# Patient Record
Sex: Female | Born: 1990 | Race: Asian | Hispanic: No | Marital: Married | Smoking: Never smoker
Health system: Southern US, Community
[De-identification: ages and names within clinical notes are randomized; demographics above are authoritative.]

## PROBLEM LIST (undated history)

## (undated) DIAGNOSIS — Z789 Other specified health status: Secondary | ICD-10-CM

## (undated) HISTORY — PX: CHOLECYSTECTOMY: SHX55

## (undated) HISTORY — PX: NO PAST SURGERIES: SHX2092

---

## 2012-07-04 ENCOUNTER — Emergency Department (INDEPENDENT_AMBULATORY_CARE_PROVIDER_SITE_OTHER)
Admission: EM | Admit: 2012-07-04 | Discharge: 2012-07-04 | Disposition: A | Discharge status: Home / Self Care | Payer: Medicaid Other | Source: Home / Self Care | Attending: Emergency Medicine | Admitting: Emergency Medicine

## 2012-07-04 ENCOUNTER — Encounter (HOSPITAL_COMMUNITY): Payer: Self-pay | Admitting: Emergency Medicine

## 2012-07-04 DIAGNOSIS — M5430 Sciatica, unspecified side: Secondary | ICD-10-CM

## 2012-07-04 DIAGNOSIS — M5431 Sciatica, right side: Secondary | ICD-10-CM

## 2012-07-04 LAB — POCT URINALYSIS DIP (DEVICE)
Glucose, UA: NEGATIVE mg/dL
Hgb urine dipstick: NEGATIVE
Nitrite: NEGATIVE
Urobilinogen, UA: 0.2 mg/dL (ref 0.0–1.0)

## 2012-07-04 LAB — POCT PREGNANCY, URINE: Preg Test, Ur: NEGATIVE

## 2012-07-04 MED ORDER — HYDROCODONE-ACETAMINOPHEN 5-325 MG PO TABS
ORAL_TABLET | ORAL | Status: AC
  Filled 2012-07-04: qty 1

## 2012-07-04 MED ORDER — PREDNISONE 20 MG PO TABS
ORAL_TABLET | ORAL | Status: AC
  Filled 2012-07-04: qty 3

## 2012-07-04 MED ORDER — CYCLOBENZAPRINE HCL 10 MG PO TABS: 10.0000 mg | ORAL_TABLET | Freq: Three times a day (TID) | ORAL | Status: DC | PRN

## 2012-07-04 MED ORDER — PREDNISONE 20 MG PO TABS: 40.0000 mg | ORAL_TABLET | Freq: Every day | ORAL | Status: DC

## 2012-07-04 MED ORDER — PREDNISONE 20 MG PO TABS
60.0000 mg | ORAL_TABLET | Freq: Once | ORAL | Status: AC
  Administered 2012-07-04: 60 mg via ORAL

## 2012-07-04 MED ORDER — HYDROCODONE-ACETAMINOPHEN 5-325 MG PO TABS
1.0000 | ORAL_TABLET | Freq: Once | ORAL | Status: AC
  Administered 2012-07-04: 1 via ORAL

## 2012-07-04 MED ORDER — HYDROCODONE-ACETAMINOPHEN 5-325 MG PO TABS: 1.0000 | ORAL_TABLET | ORAL | Status: DC | PRN

## 2012-07-04 NOTE — ED Provider Notes (Signed)
Medical screening examination/treatment/procedure(s) were performed by non-physician practitioner and as supervising physician I was immediately available for consultation/collaboration.  Ruthie Berch, M.D.  Jahna Liebert C Vista Sawatzky, MD 07/04/12 2035 

## 2012-07-04 NOTE — ED Notes (Signed)
Pt c/o flank pain and bilateral leg pain x 1 wk.  Pt has not tried any otc meds for relief.  Language barrier.

## 2012-07-04 NOTE — ED Provider Notes (Signed)
History     CSN: [id number]  Arrival date & time 07/04/12  1535   First MD Initiated Contact with Patient 07/04/12 1555      Chief Complaint  Patient presents with  . Flank Pain    x 1 wk  . Leg Pain    HPI: Patient is a 22 y.o. female presenting with flank pain and leg pain. The history is provided by the patient.  Flank Pain This is a new problem. The current episode started more than 2 days ago. The problem occurs constantly. The problem has been gradually worsening. The symptoms are aggravated by walking, standing and bending. She has tried nothing for the symptoms.  Leg Pain Pt's spouse is interpreting for pt. Spouse reports pt has been c/o (R) flank and upper (R) leg pain x 1 week. Denies injury. States the pain starts in her lower (R) back and radiates into her (R) gluteal muscle and into the anterior and posterior aspects of (R) upper leg.  Denies heavy lifting or strenuous activity. Has never had this pain before. Has tried nothing for it. Worse w/ certain movements. Denies urinary sx's or fever.  History reviewed. No pertinent past medical history.  History reviewed. No pertinent past surgical history.  History reviewed. No pertinent family history.  History  Substance Use Topics  . Smoking status: Never Smoker   . Smokeless tobacco: Not on file  . Alcohol Use: No    OB History   Grav Para Term Preterm Abortions TAB SAB Ect Mult Living                  Review of Systems  Genitourinary: Positive for flank pain.  All other systems reviewed and are negative.    Allergies  Review of patient's allergies indicates no known allergies.  Home Medications  No current outpatient prescriptions on file.  BP 113/77  Pulse 86  Temp(Src) 98 F (36.7 C) (Oral)  Resp 19  SpO2 100%  Physical Exam  Constitutional: She is oriented to person, place, and time. She appears well-developed and well-nourished.  HENT:  Head: Normocephalic and atraumatic.  Eyes:  Conjunctivae are normal.  Neck: Neck supple.  Cardiovascular: Normal rate.   Pulmonary/Chest: Effort normal.  Musculoskeletal:       Back:  (R) paraspinal TTP Positive straight leg left on (R) at approx 45-60 degrees. Negative on (L)  Neurological: She is alert and oriented to person, place, and time.  Skin: Skin is warm and dry.  Psychiatric: She has a normal mood and affect.    ED Course  Procedures (including critical care time)  Labs Reviewed  POCT URINALYSIS DIP (DEVICE) - Abnormal; Notable for the following:    Leukocytes, UA SMALL (*)    All other components within normal limits  POCT PREGNANCY, URINE   No results found.   No diagnosis found.    MDM  1 week h/o (R) flank, and (R) upper leg pain. Pain starts in (R) lower back and radiates into (R) gluteal and (R) upper (ant/post) leg. Sx's c/w sciatica (R). Urine negative for blood. Will treat for sciatica with Prednisone, muscle relaxer and medication for pain and provide ortho referral for F/U if needed.         Leanne Chang, NP 07/04/12 1824

## 2012-08-17 ENCOUNTER — Emergency Department (INDEPENDENT_AMBULATORY_CARE_PROVIDER_SITE_OTHER): Payer: Medicaid Other

## 2012-08-17 ENCOUNTER — Encounter (HOSPITAL_COMMUNITY): Payer: Self-pay | Admitting: Emergency Medicine

## 2012-08-17 ENCOUNTER — Emergency Department (INDEPENDENT_AMBULATORY_CARE_PROVIDER_SITE_OTHER)
Admission: EM | Admit: 2012-08-17 | Discharge: 2012-08-17 | Disposition: A | Discharge status: Home / Self Care | Payer: Medicaid Other | Source: Home / Self Care | Attending: Emergency Medicine | Admitting: Emergency Medicine

## 2012-08-17 DIAGNOSIS — M5430 Sciatica, unspecified side: Secondary | ICD-10-CM

## 2012-08-17 DIAGNOSIS — M5431 Sciatica, right side: Secondary | ICD-10-CM

## 2012-08-17 DIAGNOSIS — M4120 Other idiopathic scoliosis, site unspecified: Secondary | ICD-10-CM

## 2012-08-17 DIAGNOSIS — M419 Scoliosis, unspecified: Secondary | ICD-10-CM

## 2012-08-17 LAB — POCT URINALYSIS DIP (DEVICE)
Glucose, UA: NEGATIVE mg/dL
Nitrite: NEGATIVE
Urobilinogen, UA: 0.2 mg/dL (ref 0.0–1.0)

## 2012-08-17 MED ORDER — IBUPROFEN 600 MG PO TABS: 600.0000 mg | ORAL_TABLET | Freq: Four times a day (QID) | ORAL | Status: DC | PRN

## 2012-08-17 MED ORDER — TRAMADOL HCL 50 MG PO TABS: 50.0000 mg | ORAL_TABLET | Freq: Four times a day (QID) | ORAL | Status: DC | PRN

## 2012-08-17 NOTE — ED Provider Notes (Signed)
History     CSN: [id number]  Arrival date & time 08/17/12  1458   First MD Initiated Contact with Patient 08/17/12 1606      Chief Complaint  Patient presents with  . Leg Pain    (Consider location/radiation/quality/duration/timing/severity/associated sxs/prior treatment) Patient is a 22 y.o. female presenting with leg pain. The history is provided by the patient. No language interpreter was used.  Leg Pain Lower extremity pain location: LOW BACK PAIN  Time since incident: MORE THAN 1 MONTH. Injury: no   Pain details:    Quality:  Dull   Radiates to:  L leg and R leg   Severity:  Mild   Onset quality:  Gradual   Duration:  2 months   Timing:  Constant   Progression:  Unchanged Chronicity:  Recurrent Dislocation: no   Prior injury to area:  No Relieved by:  Nothing Worsened by:  Activity Ineffective treatments:  Muscle relaxant and rest Associated symptoms: back pain and stiffness   Associated symptoms: no fever and no neck pain   Risk factors: no concern for non-accidental trauma, no known bone disorder and no recent illness     History reviewed. No pertinent past medical history.  History reviewed. No pertinent past surgical history.  No family history on file.  History  Substance Use Topics  . Smoking status: Never Smoker   . Smokeless tobacco: Not on file  . Alcohol Use: No    OB History   Grav Para Term Preterm Abortions TAB SAB Ect Mult Living                  Review of Systems  Constitutional: Negative.  Negative for fever.  HENT: Negative for neck pain.   Musculoskeletal: Positive for back pain and stiffness.  All other systems reviewed and are negative.    Allergies  Review of patient's allergies indicates no known allergies.  Home Medications   Current Outpatient Rx  Name  Route  Sig  Dispense  Refill  . cyclobenzaprine (FLEXERIL) 10 MG tablet   Oral   Take 1 tablet (10 mg total) by mouth 3 (three) times daily as needed for muscle  spasms.   20 tablet   0   . HYDROcodone-acetaminophen (NORCO/VICODIN) 5-325 MG per tablet   Oral   Take 1 tablet by mouth every 4 (four) hours as needed for pain.   10 tablet   0   . ibuprofen (ADVIL,MOTRIN) 600 MG tablet   Oral   Take 1 tablet (600 mg total) by mouth every 6 (six) hours as needed for pain.   30 tablet   0   . predniSONE (DELTASONE) 20 MG tablet   Oral   Take 2 tablets (40 mg total) by mouth daily.   14 tablet   0   . traMADol (ULTRAM) 50 MG tablet   Oral   Take 1 tablet (50 mg total) by mouth every 6 (six) hours as needed for pain.   15 tablet   0     BP 102/67  Pulse 91  Temp(Src) 98.3 F (36.8 C) (Oral)  Resp 16  SpO2 99%  Physical Exam  Constitutional: She is oriented to person, place, and time. She appears well-developed and well-nourished.  HENT:  Head: Normocephalic and atraumatic.  Eyes: Conjunctivae are normal. Pupils are equal, round, and reactive to light.  Neck: Normal range of motion. Neck supple.  Cardiovascular: Normal rate, regular rhythm and normal heart sounds.   Pulmonary/Chest: Effort normal  and breath sounds normal.  Abdominal: Soft. Bowel sounds are normal.  Musculoskeletal: She exhibits tenderness.  LUMBOSACRAL AREA SHOWED MILD TENDERNESS PARALUMBAR  AREAS BUT NO SWELLING OR DEFORMITY NO CVA TENDERNESS   Neurological: She is alert and oriented to person, place, and time. She has normal reflexes.  Skin: Skin is warm and dry.  Psychiatric: She has a normal mood and affect. Her behavior is normal. Judgment and thought content normal.    ED Course  Procedures (including critical care time)  Labs Reviewed  POCT URINALYSIS DIP (DEVICE) - Abnormal; Notable for the following:    Hgb urine dipstick LARGE (*)    All other components within normal limits  POCT PREGNANCY, URINE   No results found.   1. Bilateral sciatica   2. Scoliosis of lumbar spine       MDM          Jani Files, MD 08/20/12  2011

## 2012-08-17 NOTE — ED Notes (Signed)
Pt c/o bilateral leg pain onset 3 months Denies: inj/trauma to site, f/v/n/d Sx include: numbness and tingly of both legs all the time, pain is constant either she rests or moves The pain does get worse w/activity Seen here in the past for the same reason; states the meds don't help her  She is alert and oriented w/no signs of acute distress.

## 2012-09-25 ENCOUNTER — Encounter: Payer: Self-pay | Admitting: Family Medicine

## 2012-09-25 ENCOUNTER — Ambulatory Visit: Payer: Medicaid Other | Attending: Family Medicine | Admitting: Family Medicine

## 2012-09-25 VITALS — BP 115/81 | HR 89 | Temp 98.2°F | Resp 17 | Wt 108.0 lb

## 2012-09-25 DIAGNOSIS — IMO0002 Reserved for concepts with insufficient information to code with codable children: Secondary | ICD-10-CM | POA: Insufficient documentation

## 2012-09-25 DIAGNOSIS — M545 Low back pain: Secondary | ICD-10-CM | POA: Insufficient documentation

## 2012-09-25 DIAGNOSIS — M4120 Other idiopathic scoliosis, site unspecified: Secondary | ICD-10-CM | POA: Insufficient documentation

## 2012-09-25 DIAGNOSIS — M79609 Pain in unspecified limb: Secondary | ICD-10-CM | POA: Insufficient documentation

## 2012-09-25 DIAGNOSIS — N979 Female infertility, unspecified: Secondary | ICD-10-CM

## 2012-09-25 DIAGNOSIS — M25569 Pain in unspecified knee: Secondary | ICD-10-CM

## 2012-09-25 NOTE — Patient Instructions (Signed)
Scoliosis Scoliosis is the name given to a spine that curves sideways. It is a common condition found in up to ten percent of adolescents. It is more common in teenage girls. This is sometimes the result of other underlying problems such as unequal leg length or muscular problems. Approximately 70% of the time the cause unknown. It can cause twisting of the shoulders, hips, chest, back, and rib cage. Exercises generally do not affect the course of this disease, but may be helpful in strengthening weak muscle groups. Orthopedic braces may be needed during growth spurts. Surgery may be necessary for progressive cases. HOME CARE INSTRUCTIONS   Your caregiver may suggest exercises to strengthen your muscles. Follow their instructions. Ask your caregiver if you can participate in sports activities.  Bracing may be needed to try to limit the progression of the spinal curve. Wear the brace as instructed by your caregiver.  Follow-up appointments are important. Often mild cases of scoliosis can be kept track of by regular physical exams. However, periodic x-rays may be taken in more severe cases to follow the progress of the curvature, especially with brace treatment. Scoliosis can be corrected or improved if treated early. SEEK IMMEDIATE MEDICAL CARE IF:  You have back pain that is not relieved by medications prescribed by your caregiver.  If there is weakness or increased muscle tone (spasticity) in your legs or any loss of bowel or bladder control. Document Released: 04/05/2000 Document Revised: 07/01/2011 Document Reviewed: 04/25/2008 [facility] Patient Information 2014 [place], [place]. Infertility WHAT IS INFERTILITY?  Infertility is usually defined as not being able to get pregnant after trying for one year of regular sexual intercourse without the use of contraceptives. Or not being able to carry a pregnancy to term and have a baby. The infertility rate in the [place] is around 10%. Pregnancy  is the result of a chain of events. A woman must release an egg from one of her ovaries (ovulation). The egg must be fertilized by the female sperm. Then it travels through a fallopian tube into the uterus (womb), where it attaches to the wall of the uterus and grows. A man must have enough sperm, and the sperm must join with (fertilize) the egg along the way, at the proper time. The fertilized egg must then become attached to the inside of the uterus. While this may seem simple, many things can happen to prevent pregnancy from occurring.  WHOSE PROBLEM IS IT?  About 20% of infertility cases are due to problems with the man (female factors) and 65% are due to problems with the woman (female factors). Other cases are due to a combination of female and female factors or to unknown causes.  WHAT CAUSES INFERTILITY IN MEN?  Infertility in men is often caused by problems with making enough normal sperm or getting the sperm to reach the egg. Problems with sperm may exist from birth or develop later in life, due to illness or injury. Some men produce no sperm, or produce too few sperm (oligospermia). Other problems include:  Sexual dysfunction.  Hormonal or endocrine problems.  Age. Female fertility decreases with age, but not at as young an age as female fertility.  Infection.  Congenital problems. Birth defect, such as absence of the tubes that carry the sperm (vas deferens).  Genetic/chromosomal problems.  Antisperm antibody problems.  Retrograde ejaculation (sperm go into the bladder).  Varicoceles, spematoceles, or tumors of the testicles.  Lifestyle can influence the number and quality of a man's sperm.  Alcohol and drugs can temporarily reduce sperm quality.  Environmental toxins, including pesticides and lead, may cause some cases of infertility in men. WHAT CAUSES INFERTILITY IN WOMEN?   Problems with ovulation account for most infertility in women. Without ovulation, eggs are not available  to be fertilized.  Signs of problems with ovulation include irregular menstrual periods or no periods at all.  Simple lifestyle factors, including stress, diet, or athletic training, can affect a woman's hormonal balance.  Age. Fertility begins to decrease in women in the early 61s and is worse after age 23.  Much less often, a hormonal imbalance from a serious medical problem, such as a pituitary gland tumor, thyroid or other chronic medical disease, can cause ovulation problems.  Pelvic infections.  Polycystic ovary syndrome (increase in female hormones, unable to ovulate).  Alcohol or illegal drugs.  Environmental toxins, radiation, pesticides, and certain chemicals.  Aging is an important factor in female infertility.  The ability of a woman's ovaries to produce eggs declines with age, especially after age 19. About one third of couples where the woman is over 35 will have problems with fertility.  By the time she reaches menopause when her monthly periods stop for good, a woman can no longer produce eggs or become pregnant.  Other problems can also lead to infertility in women. If the fallopian tubes are blocked at one or both ends, the egg cannot travel through the tubes into the uterus. Scar tissue (adhesions) in the pelvis may cause blocked tubes. This may result from pelvic inflammatory disease, endometriosis, or surgery for an ectopic pregnancy (fertilized egg implanted outside the uterus) or any pelvic or abdominal surgery causing adhesions.  Fibroid tumors or polyps of the uterus.  Congenital (birth defect) abnormalities of the uterus.  Infection of the cervix (cervicitis).  Cervical stenosis (narrowing).  Abnormal cervical mucus.  Polycystic ovary syndrome.  Having sexual intercourse too often (every other day or 4 to 5 times a week).  Obesity.  Anorexia.  Poor nutrition.  Over exercising, with loss of body fat.  DES. Your mother received diethylstilbesterol  hormone when pregnant with you. HOW IS INFERTILITY TESTED?  If you have been trying to have a baby without success, you may want to seek medical help. You should not wait for one year of trying before seeing a health care provider if:  You are over 35.  You have reason to believe that there may be a fertility problem. A medical evaluation may determine the reasons for a couple's infertility. Usually this process begins with:  Physical exams.  Medical histories of both partners.  Sexual histories of both partners. If there is no obvious problem, like improperly timed intercourse or absence of ovulation, tests may be needed.   For a man, testing usually begins with tests of his semen to look at:  The number of sperm.  The shape of sperm.  Movement of his sperm.  Taking a complete medical and surgical history.  Physical examination.  Check for infection of the female reproductive organs. Sometimes hormone tests are done.   For a woman, the first step in testing is to find out if she is ovulating each month. There are several ways to do this. For example, she can keep track of changes in her morning body temperature and in the texture of her cervical mucus. Another tool is a home ovulation test kit, which can be bought at drug or grocery stores.  Checks of ovulation can also be done  in the doctor's office, using blood tests for hormone levels or ultrasound tests of the ovaries. If the woman is ovulating, more tests will need to be done. Some common female tests include:  Hysterosalpingogram: An x-ray of the fallopian tubes and uterus after they are injected with dye. It shows if the tubes are open and shows the shape of the uterus.  Laparoscopy: An exam of the tubes and other female organs for disease. A lighted tube called a laparoscope is used to see inside the abdomen.  Endometrial biopsy: Sample of uterus tissue taken on the first day of the menstrual period, to see if the tissue  indicates you are ovulating.  Transvaginal ultrasound: Examines the female organs.  Hysteroscopy: Uses a lighted tube to examine the cervix and inside the uterus, to see if there are any abnormalities inside the uterus. TREATMENT  Depending on the test results, different treatments can be suggested. The type of treatment depends on the cause. 85 to 90% of infertility cases are treated with drugs or surgery.   Various fertility drugs may be used for women with ovulation problems. It is important to talk with your caregiver about the drug to be used. You should understand the drug's benefits and side effects. Depending on the type of fertility drug and the dosage of the drug used, multiple births (twins or multiples) can occur in some women.  If needed, surgery can be done to repair damage to a woman's ovaries, fallopian tubes, cervix, or uterus.  Surgery or medical treatment for endometriosis or polycystic ovary syndrome. Sometimes a man has an infertility problem that can be corrected with medicine or by surgery.  Intrauterine insemination (IUI) of sperm, timed with ovulation.  Change in lifestyle, if that is the cause (lose weight, increase exercise, and stop smoking, drinking excessively, or taking illegal drugs).  Other types of surgery:  Removing growths inside and on the uterus.  Removing scar tissue from inside of the uterus.  Fixing blocked tubes.  Removing scar tissue in the pelvis and around the female organs. WHAT IS ASSISTED REPRODUCTIVE TECHNOLOGY (ART)?  Assisted reproductive technology (ART) is another form of special methods used to help infertile couples. ART involves handling both the woman's eggs and the man's sperm. Success rates vary and depend on many factors. ART can be expensive and time-consuming. But ART has made it possible for many couples to have children that otherwise would not have been conceived. Some methods are listed below:  In vitro fertilization  (IVF). IVF is often used when a woman's fallopian tubes are blocked or when a man has low sperm counts. A drug is used to stimulate the ovaries to produce multiple eggs. Once mature, the eggs are removed and placed in a culture dish with the man's sperm for fertilization. After about 40 hours, the eggs are examined to see if they have become fertilized by the sperm and are dividing into cells. These fertilized eggs (embryos) are then placed in the woman's uterus. This bypasses the fallopian tubes.  Gamete intrafallopian transfer (GIFT) is similar to IVF, but used when the woman has at least one normal fallopian tube. Three to five eggs are placed in the fallopian tube, along with the man's sperm, for fertilization inside the woman's body.  Zygote intrafallopian transfer (ZIFT), also called tubal embryo transfer, combines IVF and GIFT. The eggs retrieved from the woman's ovaries are fertilized in the lab and placed in the fallopian tubes rather than in the uterus.  ART procedures  sometimes involve the use of donor eggs (eggs from another woman) or previously frozen embryos. Donor eggs may be used if a woman has impaired ovaries or has a genetic disease that could be passed on to her baby.  When performing ART, you are at higher risk for resulting in multiple pregnancies, twins, triplets or more.  Intracytoplasma sperm injection is a procedure that injects a single sperm into the egg to fertilize it.  Embryo transplant is a procedure that starts after growing an embryo in a special media (chemical solution) developed to keep the embryo alive for 2 to 5 days, and then transplanting it into the uterus. In cases where a cause cannot be found and pregnancy does not occur, adoption may be a consideration. Document Released: 04/11/2003 Document Revised: 07/01/2011 Document Reviewed: 03/07/2009 [facility] Patient Information 2014 [place], [place].

## 2012-09-25 NOTE — Progress Notes (Signed)
Patient ID: Kelsey Davies, female   DOB: [birth year], 22 y.o.   MRN: [id number]  CC: Translator used to communicate with patient  HPI: Pt reports that she has been trying for over 1 year to get pregnant and she reports that she has had pain in her lower back legs and hips for a while.  She was seen for this in urgent care and had xrays done (negative for acute) and she was given some pain meds but they did not help her symptoms.   No Known Allergies No past medical history on file. Current Outpatient Prescriptions on File Prior to Visit  Medication Sig Dispense Refill  . cyclobenzaprine (FLEXERIL) 10 MG tablet Take 1 tablet (10 mg total) by mouth 3 (three) times daily as needed for muscle spasms.  20 tablet  0  . HYDROcodone-acetaminophen (NORCO/VICODIN) 5-325 MG per tablet Take 1 tablet by mouth every 4 (four) hours as needed for pain.  10 tablet  0  . ibuprofen (ADVIL,MOTRIN) 600 MG tablet Take 1 tablet (600 mg total) by mouth every 6 (six) hours as needed for pain.  30 tablet  0  . predniSONE (DELTASONE) 20 MG tablet Take 2 tablets (40 mg total) by mouth daily.  14 tablet  0  . traMADol (ULTRAM) 50 MG tablet Take 1 tablet (50 mg total) by mouth every 6 (six) hours as needed for pain.  15 tablet  0   No current facility-administered medications on file prior to visit.   No family history on file. History   Social History  . Marital Status: Single    Spouse Name: N/A    Number of Children: N/A  . Years of Education: N/A   Occupational History  . Not on file.   Social History Main Topics  . Smoking status: Never Smoker   . Smokeless tobacco: Not on file  . Alcohol Use: No  . Drug Use: No  . Sexually Active: Yes    Birth Control/ Protection: None   Other Topics Concern  . Not on file   Social History Narrative  . No narrative on file    Review of Systems  Constitutional: Negative for fever, chills, diaphoresis, activity change, appetite change and fatigue.  HENT:  Negative for ear pain, nosebleeds, congestion, facial swelling, rhinorrhea, neck pain, neck stiffness and ear discharge.   Eyes: Negative for pain, discharge, redness, itching and visual disturbance.  Respiratory: Negative for cough, choking, chest tightness, shortness of breath, wheezing and stridor.   Cardiovascular: Negative for chest pain, palpitations and leg swelling.  Gastrointestinal: Negative for abdominal distention.  Genitourinary: Negative for dysuria, urgency, frequency, hematuria, flank pain, decreased urine volume, difficulty urinating and dyspareunia.  Musculoskeletal: no gait problems, some back and leg and hip pain   Neurological: Negative for dizziness, tremors, seizures, syncope, facial asymmetry, speech difficulty, weakness, light-headedness, numbness and headaches.  Hematological: Negative for adenopathy. Does not bruise/bleed easily.  Psychiatric/Behavioral: Negative for hallucinations, behavioral problems, confusion, dysphoric mood, decreased concentration and agitation.    Objective:   Filed Vitals:   09/25/12 1338  BP: 115/81  Pulse: 89  Temp: 98.2 F (36.8 C)  Resp: 17    Physical Exam  Constitutional: Appears well-developed and well-nourished. No distress.  HENT: Normocephalic. External right and left ear normal. Oropharynx is clear and moist.  Eyes: Conjunctivae and EOM are normal. PERRLA, no scleral icterus.  Neck: Normal ROM. Neck supple. No JVD. No tracheal deviation. No thyromegaly.  CVS: RRR, S1/S2 +, no murmurs, no gallops,  no carotid bruit.  Pulmonary: Effort and breath sounds normal, no stridor, rhonchi, wheezes, rales.  Abdominal: Soft. BS +,  no distension, tenderness, rebound or guarding.  Musculoskeletal: Normal range of motion. No edema and no tenderness noted, mild scoliosis spine.  Lymphadenopathy: No lymphadenopathy noted, cervical, inguinal. Neuro: Alert. Normal reflexes, muscle tone coordination. No cranial nerve deficit. Skin: Skin is  warm and dry. No rash noted. Not diaphoretic. No erythema. No pallor.  Psychiatric: Normal mood and affect. Behavior, judgment, thought content normal.   No results found for this basename: WBC, HGB, HCT, MCV, PLT   No results found for this basename: CREATININE, BUN, NA, K, CL, CO2    No results found for this basename: HGBA1C   Lipid Panel  No results found for this basename: chol, trig, hdl, cholhdl, vldl, ldlcalc       Assessment and plan:   Infertility  Right leg pain Pt has had an xray and results reviewed from April 2014 Recommended OTC meds for inflammation as needed for pain and discomfort  Referral to [facility] for evaluation   The patient was given clear instructions to go to ER or return to medical center if symptoms don't improve, worsen or new problems develop.  The patient verbalized understanding.  The patient was told to call to get lab results if they haven't heard anything in the next week.    Follow up in 1 month  Rodney Langton, MD, CDE, FAAFP [facility] [place], [place]

## 2012-09-25 NOTE — Progress Notes (Signed)
Interpreter here with patient  States patient takes no prescribed medications Trying to get pregnant[facility]

## 2012-10-21 ENCOUNTER — Ambulatory Visit: Payer: Medicaid Other | Admitting: Sports Medicine

## 2012-10-26 ENCOUNTER — Ambulatory Visit: Payer: Medicaid Other | Attending: Family Medicine | Admitting: Family Medicine

## 2012-10-26 ENCOUNTER — Encounter: Payer: Self-pay | Admitting: Family Medicine

## 2012-10-26 VITALS — BP 102/66 | HR 87 | Temp 98.5°F | Resp 16 | Ht 59.0 in | Wt 110.2 lb

## 2012-10-26 DIAGNOSIS — N912 Amenorrhea, unspecified: Secondary | ICD-10-CM

## 2012-10-26 DIAGNOSIS — R102 Pelvic and perineal pain: Secondary | ICD-10-CM | POA: Insufficient documentation

## 2012-10-26 DIAGNOSIS — Z3400 Encounter for supervision of normal first pregnancy, unspecified trimester: Secondary | ICD-10-CM | POA: Insufficient documentation

## 2012-10-26 DIAGNOSIS — R109 Unspecified abdominal pain: Secondary | ICD-10-CM

## 2012-10-26 DIAGNOSIS — R1024 Suprapubic pain: Secondary | ICD-10-CM | POA: Insufficient documentation

## 2012-10-26 DIAGNOSIS — R1030 Lower abdominal pain, unspecified: Secondary | ICD-10-CM

## 2012-10-26 DIAGNOSIS — R1084 Generalized abdominal pain: Secondary | ICD-10-CM | POA: Insufficient documentation

## 2012-10-26 DIAGNOSIS — Z331 Pregnant state, incidental: Secondary | ICD-10-CM

## 2012-10-26 LAB — POCT URINALYSIS DIPSTICK
Bilirubin, UA: NEGATIVE
Blood, UA: NEGATIVE
Nitrite, UA: NEGATIVE
pH, UA: 7.5

## 2012-10-26 LAB — POCT URINE PREGNANCY: Preg Test, Ur: POSITIVE

## 2012-10-26 MED ORDER — GNP PRENATAL VITAMINS 28-0.8 MG PO TABS: 1.0000 | ORAL_TABLET | Freq: Every day | ORAL | Status: DC

## 2012-10-26 NOTE — Progress Notes (Signed)
Quick Note:  Pt was informed of results.   Kelsey Davies ______

## 2012-10-26 NOTE — Progress Notes (Signed)
Patient ID: Kelsey Davies, female   DOB: [birth year], 22 y.o.   MRN: [id number]  CC: follow up   HPI: Translator use to speak with patient.  Patient reports that she continues to have chronic leg pain.  Hasn't really changed.  The patient reports that she is reporting 2 weeks of having intermittent abdominal cramping.  It is in the area of her bladder.  She has mild nausea and vomiting on occasion.  No chest pain.  No fever or chills.  No loss of bowel or bladder control.  The patient is scheduled to see sports medicine August 1.  No Known Allergies History reviewed. No pertinent past medical history. Current Outpatient Prescriptions on File Prior to Visit  Medication Sig Dispense Refill  . cyclobenzaprine (FLEXERIL) 10 MG tablet Take 1 tablet (10 mg total) by mouth 3 (three) times daily as needed for muscle spasms.  20 tablet  0  . traMADol (ULTRAM) 50 MG tablet Take 1 tablet (50 mg total) by mouth every 6 (six) hours as needed for pain.  15 tablet  0  . HYDROcodone-acetaminophen (NORCO/VICODIN) 5-325 MG per tablet Take 1 tablet by mouth every 4 (four) hours as needed for pain.  10 tablet  0  . ibuprofen (ADVIL,MOTRIN) 600 MG tablet Take 1 tablet (600 mg total) by mouth every 6 (six) hours as needed for pain.  30 tablet  0  . predniSONE (DELTASONE) 20 MG tablet Take 2 tablets (40 mg total) by mouth daily.  14 tablet  0   No current facility-administered medications on file prior to visit.   History reviewed. No pertinent family history. History   Social History  . Marital Status: Single    Spouse Name: N/A    Number of Children: N/A  . Years of Education: N/A   Occupational History  . Not on file.   Social History Main Topics  . Smoking status: Never Smoker   . Smokeless tobacco: Not on file  . Alcohol Use: No  . Drug Use: No  . Sexually Active: Yes    Birth Control/ Protection: None   Other Topics Concern  . Not on file   Social History Narrative  . No narrative on file     Review of Systems  Constitutional: Negative for fever, chills, diaphoresis, activity change, appetite change and fatigue.  HENT: Negative for ear pain, nosebleeds, congestion, facial swelling, rhinorrhea, neck pain, neck stiffness and ear discharge.   Eyes: Negative for pain, discharge, redness, itching and visual disturbance.  Respiratory: Negative for cough, choking, chest tightness, shortness of breath, wheezing and stridor.   Cardiovascular: Negative for chest pain, palpitations and leg swelling.  Gastrointestinal: Negative for abdominal distention.  Genitourinary: Negative for dysuria, urgency, frequency, hematuria, flank pain, decreased urine volume, difficulty urinating and dyspareunia.  Musculoskeletal: Lower back pain and bilateral leg pain. Neurological: Negative for dizziness, tremors, seizures, syncope, facial asymmetry, speech difficulty, weakness, light-headedness, numbness and headaches.  Hematological: Negative for adenopathy. Does not bruise/bleed easily.  Psychiatric/Behavioral: Negative for hallucinations, behavioral problems, confusion, dysphoric mood, decreased concentration and agitation.    Objective:   Filed Vitals:   10/26/12 1053  BP: 102/66  Pulse: 87  Temp: 98.5 F (36.9 C)  Resp: 16    Physical Exam  Constitutional: Appears well-developed and well-nourished. No distress.  HENT: Normocephalic. External right and left ear normal. Oropharynx is clear and moist.  Eyes: Conjunctivae and EOM are normal. PERRLA, no scleral icterus.  Neck: Normal ROM. Neck supple. No JVD. No  tracheal deviation. No thyromegaly.  CVS: RRR, S1/S2 +, no murmurs, no gallops, no carotid bruit.  Pulmonary: Effort and breath sounds normal, no stridor, rhonchi, wheezes, rales.  Abdominal: Soft. BS +,  Mild suprapubic tenderness with palpation, no distension, tenderness, rebound or guarding.  Musculoskeletal: Normal range of motion. No edema and no tenderness.  Lymphadenopathy: No  lymphadenopathy noted, cervical, inguinal. Neuro: Alert. Normal reflexes, muscle tone coordination. No cranial nerve deficit. Skin: Skin is warm and dry. No rash noted. Not diaphoretic. No erythema. No pallor.  Psychiatric: Normal mood and affect. Behavior, judgment, thought content normal.   No results found for this basename: WBC, HGB, HCT, MCV, PLT   No results found for this basename: CREATININE, BUN, NA, K, CL, CO2    No results found for this basename: HGBA1C   Lipid Panel  No results found for this basename: chol, trig, hdl, cholhdl, vldl, ldlcalc    Assessment and plan:   Patient Active Problem List   Diagnosis Date Noted  . Suprapubic pain 10/26/2012  . Abdominal pain, unspecified site 10/26/2012  . Infertility 09/25/2012  . Pain in joint, lower leg 09/25/2012  . Low back pain 09/25/2012  . Idiopathic scoliosis 09/25/2012   The patient was sent to give a urinalysis and pregnancy test.  Positive. Start Prenatal Vitamins.  Rx given or pick up OTC.  Urine culture ordered   The patient is scheduled to followup with sports medicine on August 1. I placed a referral for her to [facility] an [facility]  The patient was given clear instructions to go to ER or return to medical center if symptoms don't improve, worsen or new problems develop.  The patient verbalized understanding.  The patient was told to call to get any lab results if not heard anything in the next week.    RTC in 6 months  Rodney Langton, MD, CDE, FAAFP [facility] [place], [place]

## 2012-10-26 NOTE — Patient Instructions (Addendum)

## 2012-10-26 NOTE — Progress Notes (Signed)
10/26/12 Present to clinic for abdomen pain and no  Menstrual cycle in June. P.Dorice Stiggers,RN BSN [facility]

## 2012-10-27 NOTE — Progress Notes (Signed)
Quick Note:  Please inform patient that urine culture came back negative for infection.   Rodney Langton, MD, CDE, FAAFP [facility] [place], [place]   ______

## 2012-11-20 ENCOUNTER — Encounter: Payer: Self-pay | Admitting: Family Medicine

## 2012-11-20 ENCOUNTER — Ambulatory Visit (INDEPENDENT_AMBULATORY_CARE_PROVIDER_SITE_OTHER): Payer: Medicaid Other | Admitting: Family Medicine

## 2012-11-20 ENCOUNTER — Encounter: Payer: Self-pay | Admitting: *Deleted

## 2012-11-20 VITALS — BP 103/69 | Ht 59.0 in | Wt 110.0 lb

## 2012-11-20 DIAGNOSIS — R531 Weakness: Secondary | ICD-10-CM

## 2012-11-20 DIAGNOSIS — M25569 Pain in unspecified knee: Secondary | ICD-10-CM

## 2012-11-20 DIAGNOSIS — R5383 Other fatigue: Secondary | ICD-10-CM

## 2012-11-20 DIAGNOSIS — Z331 Pregnant state, incidental: Secondary | ICD-10-CM

## 2012-11-20 NOTE — Patient Instructions (Addendum)
Thank you for coming in today. I am not sure what is causing your pain. I would like for you to get some labs drawn at the [facility]. Followup after labs.

## 2012-11-20 NOTE — Progress Notes (Signed)
CC: Bilateral leg pain HPI: Patient is a 22 year old [place] female who presents with bilateral leg pain for 6 months. Patient denies any injury, trigger, or illness at the time of onset of her symptoms. She describes pain that starts at her waist and goes down both legs. It is in both the front, side, and posterior aspect of the legs. She describes a stabbing and cramping sensation. Denies numbness, tingling, or weakness. She states that the pain involves both legs the same and involves the entire leg. Pain is worse in the calf. She describes a sensation that her "legs turn over" at night. Pain is almost constant but is worse at night and also worse when she stands up from a chair. She denies any fevers, chills, weight loss.  Of note, patient is 2 months pregnant  ROS: As above in the HPI. All other systems are stable or negative.  PMH: Idiopathic scoliosis, mild  PSH: None  Social: Patient is single. She does not work. She does not smoke or drink alcohol. Family: Patient denies any family history of diabetes, heart disease, hypertension.  Allergies: Known drug allergies    OBJECTIVE: APPEARANCE:  Patient in no acute distress.The patient appeared well nourished and normally developed. HEENT: No scleral icterus. Conjunctiva non-injected Resp: Non labored Skin: No rash MSK:  Low back exam: - Full range of motion in flexion, extension, lateral bending, rotation. She has pain with all of these movements. - Patient is diffusely tender over her low back, anterior thighs, posterior thighs, calf - Negative straight leg raise - Strength is difficult to assess as patient was unable to follow instructions based on language barrier. Strength is diffusely decreased suspected do to deconditioning. Quadriceps strength 4/5, hip flexor strength 4/5, hamstring strength 4/5 bilaterally. - Reflexes 2+ bilaterally.   ASSESSMENT: Bilateral leg pain of unclear etiology. Patient does not have pain in a  specific anatomic region, muscle group, joint, or neurologic distribution. She denies any systemic symptoms suggestive of inflammatory condition.   PLAN: At this point, will begin with laboratory to rule out more severe conditions. This will include CRP, ESR, CPK, CBC, CMP, TSH, HIV, RPR, B12, vitamin D.  She will followup after her lab results for additional discussion.

## 2012-12-01 ENCOUNTER — Ambulatory Visit: Payer: Medicaid Other | Admitting: Family Medicine

## 2012-12-01 ENCOUNTER — Ambulatory Visit (INDEPENDENT_AMBULATORY_CARE_PROVIDER_SITE_OTHER): Payer: Medicaid Other | Admitting: Family Medicine

## 2012-12-01 ENCOUNTER — Encounter: Payer: Self-pay | Admitting: Family Medicine

## 2012-12-01 VITALS — BP 112/72 | Temp 97.2°F | Wt 109.2 lb

## 2012-12-01 DIAGNOSIS — Z609 Problem related to social environment, unspecified: Secondary | ICD-10-CM

## 2012-12-01 DIAGNOSIS — Z789 Other specified health status: Secondary | ICD-10-CM

## 2012-12-01 DIAGNOSIS — Z758 Other problems related to medical facilities and other health care: Secondary | ICD-10-CM | POA: Insufficient documentation

## 2012-12-01 DIAGNOSIS — Z331 Pregnant state, incidental: Secondary | ICD-10-CM

## 2012-12-01 LAB — POCT URINALYSIS DIP (DEVICE)
Protein, ur: 30 mg/dL — AB
Specific Gravity, Urine: 1.025 (ref 1.005–1.030)
Urobilinogen, UA: 0.2 mg/dL (ref 0.0–1.0)
pH: 7 (ref 5.0–8.0)

## 2012-12-01 NOTE — Patient Instructions (Addendum)
???????????? - ????? trimester  ??? ??????? ?????, ???????? ????? ???? ?? ????? ???? 1 ???????? ??? ? ?? ?????? ?? ?, ???? ????? ????? ?? ?????? ?? ???????? ??????? ?? ?????? ?? ????? ??????????? ???????? ???? ????? ?? ????? ?? ????? ???? ?????? 6 ???? 8 ???????, ???? ? ?????? ??? ????? ??? ? ?????? ????????????? ?? ????? ??????? 12 ????? (????? trimester) ?? ??????, ??? ??????? ?????? ??? ????? ???? ?? ????? ??????? ?? ??, ????? ?? ?????? ????? ??????? ???? ????? ???? ?????? ???? ?????????? ?????? ???????? ???????????? ???? ??? ??? ???? ?? ????? ???????? ??????? ? ????? caregiver ???????? ???? ????? ??? ???????????? ????? ????? ??????? ???? ????????? ??????? ??? ???????? ???????? ?? ?? ?????????? ? ????? ?? ????? ??????, ????? ????, ? ??????? ???? ?????? ??  ???????????? EXAMS ???? ?? ????? ??? ?????, ????? ???, ???????, ? ????? ???? ???? ?? ????? ?????? ? ?????????? ?? ????? ??????? ?????? ????? ??? ????????? ????? ??  ?? ??????? ???????? ?????????? ????? 25 ???? 35 ???? ??????? ?????????? ??, ????? ???? ??? ?? ??? ?? ???, ????? caregiver ????? ??? ????????? ????? ?????? ??????  ??????? caregiver ?????? ? ????? ?? ???? ??????????? ???? ??????  ???? ???, ?????? ????????, ???? ?????? ???????, ? ?? Pap ??????? ????? ???? ?? ????? ??????? ????? ??????? ?? ??????? ????? ????????? ? ????? ????? ?? ???????? ????????? ???? ???? ??????? ????? ??? ??????? ? ????? ?????? ??? ?????? ???? ??????? ?? ???? ????? ????? ?? ???? ????? ?? ??? ???? ????????? ??????? ??? ????????? ?? ??????? ??? ?????? ???? ????? ????? ?????? ???? ????? ???? ?????? ?????? ?? ??????? ??????? ???? ??? ??? ????? ??? ??????, ??????? ??????? ????? ????? ??????, ? ????? ??? ???? (???????????) ?? ???? ??  ?? ??????????? (???????) ?????????? ?? ????? ??????? ?? ???? ? ??????? ?? ?????? ???? ????? ????? ?????? ?? ???? ??? ?????? ??? ??? ?????????? ??, ?????? ???? ??? ??????? ???? ??? ???? ??????? ??? ???????  ????? ? ????? ????? ?????? ??????? ????? ???  ????? ???? ??????? ?????????? ??? ?????  ????? trimester ???? ????????  ??????? ????? ?????????? ?? ????? ???? ??????????? ?????? ??????? ??????? ???? ??????? ?? ???? ?????? ????? ????? ? ??????? ??? ??????????? ???? ????? caregiver ????? ???????? ?????? ???? ??????????:  ??????? ????????? ???? ????????  ????? ? ??????????? ????? ????? ?? ????????? ??? ??? ????? ? ??????? ???? ???? ??? ?? ????? ????? ???? ????? ??? ???????? ?? ???? ?? ???? ? ?? ??? ???? ??????  ??????? ????? ???? ???? ? ????? ??????? ????? ??? ?????  ????? ??? ???? ?????? ????? (????? ? ???) ? (??????) ????? ?? ????? ??????????? ? ???, ????? caregiver ???????  ??????? ???? ??????? ? ???? ???? ???? ??????  ??????? ?????? ??? ????? ??? ? ???? ??????  ???????? ??? ????? ????? ??? ????? ????? ?? ???????? ??????? ? ???? ??? ?????  ????? ??????  ??? ?????  ????? ???? ???????? ???????  ??????, ????? ??????? ?? ?????? ?? ?? ???? ?????? ?????  ????? (?????????? ? ????? ??? ??? ??? ???? ??????? ?? ??????? ???? ??? ????????) ??????? ????? ????? ???????? ??? ?????  ????? ?? ?????? ? ????? ???? ??? ?????  ??? ?????? ????????  ?? ????? ?????????? ?? ????? ??? ????????, ??????? ? ???????? ?????????? ?????? ?????? ???????????? ?? ?? ????? ?? ??? ? ????? ????????? ?? ?????? ???? ????? ????????? ???? ??????? ????? ????? ???????????  ?????????? ?? 12 ?? ????? ????? ???? ?? ????? ?????? ????? ????????? ?????????? ????? ??? ??????? 2 ????? ??? ????? ?, ?????? ????? ????????? ??? ????? caregiver ????? ???????? ??? ???? ??????, ??? ? ?????????? ???????, ???????, ? ???? ????????? ?????  caregiver ???????? ?????  ?????????? ?? ?????, ????? ? ??????? ???????? ?? ???? ???? ?????? ???? ??????, ?????? ???????? ???? ????? ????? ????, ????, ??? ? ???? ?? ???? ???? ??????????, ??????, ?????, ? ???? ???? ??????? ? ???? ????? ????? ?????? ??????????? ??????? caregiver ????? ??? ?? ?????????? ????????  ????? ?????? ?? ???? ????? ????? ????? ??? ??? ???? ????? ?????  ??????? ???? ???? ?? ?????? ???? ????? ?????????? ??? ???? ????? ?????? ???? ???????  5 ???? ???? ????? 3 ???? ????? ???? 4 ????? ?? ??? ??? ??????? ? ?????? ??? ???? ?????  ???? ??? ????? ???? ????? ??? ?????? ????? ??? ??????? ? ?????? ??? ????????  ??? ???? ?????? ??? ??? ?? ??????? ??? ??????? ?????????? ?? ????? ???? ??? ????? ?????? ?????? ???? amniotic ??? ?????? ?? ?????????? (????) ??? ??????

## 2012-12-01 NOTE — Progress Notes (Signed)
   Subjective:    Kelsey Davies is a G1P0 [redacted]w[redacted]d being seen today for her first obstetrical visit.  Her obstetrical history is significant for language barrier-speaks Nepali. Patient does intend to breast feed. Pregnancy history fully reviewed.  Patient reports no complaints.  Filed Vitals:   12/01/12 0813  BP: 112/72  Temp: 97.2 F (36.2 C)  Weight: 109 lb 3.2 oz (49.533 kg)    HISTORY: OB History   Grav Para Term Preterm Abortions TAB SAB Ect Mult Living   1              # Outc Date GA Lbr Len/2nd Wgt Sex Del Anes PTL Lv   1 CUR              History reviewed. No pertinent past medical history. History reviewed. No pertinent past surgical history. History reviewed. No pertinent family history.   Exam    System: Breast:  normal appearance, no masses or tenderness   Skin: normal coloration and turgor, no rashes    Neurologic: oriented   Extremities: normal strength, tone, and muscle mass   HEENT sclera clear, anicteric   Mouth/Teeth mucous membranes moist, pharynx normal without lesions   Neck supple   Cardiovascular: regular rate and rhythm, no murmurs or gallops   Respiratory:  appears well, vitals normal, no respiratory distress, acyanotic, normal RR, ear and throat exam is normal, neck free of mass or lymphadenopathy, chest clear, no wheezing, crepitations, rhonchi, normal symmetric air entry   Abdomen: soft, non-tender; bowel sounds normal; no masses,  no organomegaly      Assessment:    Pregnancy: G1P0 Patient Active Problem List   Diagnosis Date Noted  . Supervision of normal first pregnancy 10/26/2012    Priority: High  . Idiopathic scoliosis 09/25/2012        Plan:     Initial labs drawn. Prenatal vitamins. Problem list reviewed and updated. Genetic Screening discussed First Screen: ordered.  Ultrasound discussed; fetal survey: discussed.  Follow up in 4 weeks. New OB labs today.  Too young for pap.   Kelsey Davies S 12/01/2012

## 2012-12-01 NOTE — Progress Notes (Signed)
Pulse- 101  Pain- in calf "always like that" , lower abd New ob packet given  Weight gain 25-35lbs

## 2012-12-02 LAB — OBSTETRIC PANEL
Antibody Screen: NEGATIVE
Basophils Relative: 0 % (ref 0–1)
Eosinophils Absolute: 0.1 10*3/uL (ref 0.0–0.7)
HCT: 36.7 % (ref 36.0–46.0)
Hemoglobin: 12.5 g/dL (ref 12.0–15.0)
MCH: 26.4 pg (ref 26.0–34.0)
MCHC: 34.1 g/dL (ref 30.0–36.0)
MCV: 77.4 fL — ABNORMAL LOW (ref 78.0–100.0)
Monocytes Absolute: 0.5 10*3/uL (ref 0.1–1.0)
Monocytes Relative: 4 % (ref 3–12)
Rh Type: POSITIVE

## 2012-12-02 LAB — GC/CHLAMYDIA PROBE AMP
CT Probe RNA: NEGATIVE
GC Probe RNA: NEGATIVE

## 2012-12-03 LAB — HEMOGLOBINOPATHY EVALUATION: Hgb A: 97.1 % (ref 96.8–97.8)

## 2012-12-05 LAB — CULTURE, OB URINE

## 2012-12-09 ENCOUNTER — Ambulatory Visit (HOSPITAL_COMMUNITY)
Admission: RE | Admit: 2012-12-09 | Discharge: 2012-12-09 | Disposition: A | Discharge status: Home / Self Care | Payer: Medicaid Other | Source: Ambulatory Visit | Attending: Family Medicine | Admitting: Family Medicine

## 2012-12-09 DIAGNOSIS — Z789 Other specified health status: Secondary | ICD-10-CM

## 2012-12-09 DIAGNOSIS — O351XX0 Maternal care for (suspected) chromosomal abnormality in fetus, not applicable or unspecified: Secondary | ICD-10-CM | POA: Insufficient documentation

## 2012-12-09 DIAGNOSIS — Z3689 Encounter for other specified antenatal screening: Secondary | ICD-10-CM | POA: Insufficient documentation

## 2012-12-09 DIAGNOSIS — O3510X0 Maternal care for (suspected) chromosomal abnormality in fetus, unspecified, not applicable or unspecified: Secondary | ICD-10-CM | POA: Insufficient documentation

## 2012-12-09 NOTE — Progress Notes (Signed)
Kelsey Davies  was seen today for an ultrasound appointment.  See full report in [facility].  Impression: Single IUP at 13 0/7 weeks Normal NT (1.5 cm).  Nasal bone visualized First trimester aneuploidy screen performed as noted above.    Recommendations: Please do not draw triple/quad screen, though patient should be offered MSAFP for neural tube defect screening.  Recommend ultrasound for fetal anatomy at approximately 18 weeks.  Alpha Gula, MD

## 2012-12-14 ENCOUNTER — Encounter: Payer: Self-pay | Admitting: *Deleted

## 2012-12-14 DIAGNOSIS — Z3400 Encounter for supervision of normal first pregnancy, unspecified trimester: Secondary | ICD-10-CM

## 2012-12-15 ENCOUNTER — Other Ambulatory Visit: Payer: Self-pay

## 2012-12-29 ENCOUNTER — Ambulatory Visit (INDEPENDENT_AMBULATORY_CARE_PROVIDER_SITE_OTHER): Payer: Medicaid Other | Admitting: Obstetrics & Gynecology

## 2012-12-29 VITALS — BP 109/72 | Temp 97.5°F | Wt 105.6 lb

## 2012-12-29 DIAGNOSIS — O219 Vomiting of pregnancy, unspecified: Secondary | ICD-10-CM

## 2012-12-29 DIAGNOSIS — O210 Mild hyperemesis gravidarum: Secondary | ICD-10-CM

## 2012-12-29 DIAGNOSIS — Z3402 Encounter for supervision of normal first pregnancy, second trimester: Secondary | ICD-10-CM

## 2012-12-29 LAB — POCT URINALYSIS DIP (DEVICE)
Glucose, UA: NEGATIVE mg/dL
Hgb urine dipstick: NEGATIVE
Nitrite: NEGATIVE
Protein, ur: NEGATIVE mg/dL
Urobilinogen, UA: 0.2 mg/dL (ref 0.0–1.0)

## 2012-12-29 MED ORDER — PROMETHAZINE HCL 25 MG PO TABS: 25.0000 mg | ORAL_TABLET | Freq: Four times a day (QID) | ORAL | Status: DC | PRN

## 2012-12-29 MED ORDER — METOCLOPRAMIDE HCL 10 MG PO TABS
10.0000 mg | ORAL_TABLET | Freq: Four times a day (QID) | ORAL | Status: DC
Start: 2012-12-29 — End: 2013-04-20

## 2012-12-29 NOTE — Patient Instructions (Addendum)
Pregnancy - Second Trimester The second trimester is the period between 13 to 27 weeks of your pregnancy. It is important to follow your doctor's instructions. HOME CARE   Do not smoke.  Do not drink alcohol or use drugs.  Only take medicine as told by your doctor.  Take prenatal vitamins as told. The vitamin should contain 1 milligram of folic acid.  Exercise.  Eat healthy foods. Eat regular, well-balanced meals.  You can have sex (intercourse) if there are no other problems with the pregnancy.  Do not use hot tubs, steam rooms, or saunas.  Wear a seat belt while driving.  Avoid raw meat, uncooked cheese, and litter boxes and soil used by cats.  Visit your dentist. Cleanings are okay. GET HELP RIGHT AWAY IF:   You have a temperature by mouth above 102 F (38.9 C), not controlled by medicine.  Fluid is coming from your vagina.  Blood is coming from your vagina. Light spotting is common, especially after sex (intercourse).  You have a bad smelling fluid (discharge) coming from the vagina. The fluid changes from clear to white.  You still feel sick to your stomach (nauseous).  You throw up (vomit) blood.  You lose or gain more than 2 pounds (0.9 kilograms) of weight in a week, or as suggested by your doctor.  Your face, hands, feet, or legs get puffy (swell).  You get exposed to German measles and have never had them.  You get exposed to fifth disease or chickenpox.  You have belly (abdominal) pain.  You have a bad headache that will not go away.  You have watery poop (diarrhea), pain when you pee (urinate), or have shortness of breath.  You start to have problems seeing (blurry or double vision).  You fall, are in a car accident, or have any kind of trauma.  There is mental or physical violence at home.  You have any concerns or worries during your pregnancy. MAKE SURE YOU:   Understand these instructions.  Will watch your condition.  Will get help  right away if you are not doing well or get worse. Document Released: 07/03/2009 Document Revised: 07/01/2011 Document Reviewed: 07/03/2009 Exit[employer] Patient Information 2014 [employer].  

## 2012-12-29 NOTE — Progress Notes (Signed)
Pt with 4# weight loss.  She c/o lots of mucous production and emesis.   Will Rx Reglan and Phenergan F/u in 4 weeks sono in 2 weeks Quad screen next visit.

## 2012-12-29 NOTE — Progress Notes (Signed)
Pulse-74  Pain-stomach

## 2013-01-07 ENCOUNTER — Emergency Department (HOSPITAL_COMMUNITY)
Admission: EM | Admit: 2013-01-07 | Discharge: 2013-01-07 | Disposition: A | Discharge status: Home / Self Care | Payer: Medicaid Other | Source: Home / Self Care | Attending: Emergency Medicine | Admitting: Emergency Medicine

## 2013-01-07 ENCOUNTER — Encounter (HOSPITAL_COMMUNITY): Payer: Self-pay | Admitting: *Deleted

## 2013-01-07 DIAGNOSIS — Z3689 Encounter for other specified antenatal screening: Secondary | ICD-10-CM

## 2013-01-07 DIAGNOSIS — O210 Mild hyperemesis gravidarum: Secondary | ICD-10-CM

## 2013-01-07 LAB — POCT I-STAT, CHEM 8
BUN: <3 mg/dL — ABNORMAL LOW (ref 6–23)
Chloride: 102 mEq/L (ref 96–112)
Creatinine, Ser: 0.5 mg/dL (ref 0.50–1.10)
Potassium: 3.9 mEq/L (ref 3.5–5.1)
Sodium: 136 mEq/L (ref 135–145)

## 2013-01-07 LAB — POCT URINALYSIS DIP (DEVICE)
Hgb urine dipstick: NEGATIVE
Ketones, ur: >=160 mg/dL — AB
Leukocytes, UA: NEGATIVE
pH: 6 (ref 5.0–8.0)

## 2013-01-07 MED ORDER — SODIUM CHLORIDE 0.9 % IV SOLN
INTRAVENOUS | Status: DC
  Administered 2013-01-07 (×2): via INTRAVENOUS

## 2013-01-07 MED ORDER — ONDANSETRON HCL 4 MG/2ML IJ SOLN
4.0000 mg | Freq: Once | INTRAMUSCULAR | Status: AC
  Administered 2013-01-07: 4 mg via INTRAVENOUS

## 2013-01-07 MED ORDER — ONDANSETRON HCL 4 MG/2ML IJ SOLN
INTRAMUSCULAR | Status: AC
  Filled 2013-01-07: qty 2

## 2013-01-07 MED ORDER — ONDANSETRON HCL 4 MG/2ML IJ SOLN: 4.0000 mg | Freq: Once | INTRAMUSCULAR | Status: DC

## 2013-01-07 MED ORDER — ONDANSETRON 8 MG PO TBDP: 8.0000 mg | ORAL_TABLET | Freq: Three times a day (TID) | ORAL | Status: DC | PRN

## 2013-01-07 NOTE — ED Notes (Signed)
Pt  Reports      Symptoms  Of      Vomiting         -  Since  Last  Week         -  Pt  Is  [redacted]  Weeks  Pregnant          -  denys  Any    Discharge   Or bleeding            Sitting  Upright on  Exam table  speaking in  Complete  sentances

## 2013-01-07 NOTE — ED Provider Notes (Signed)
Chief Complaint:   Chief Complaint  Patient presents with  . 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 She is [redacted] weeks pregnant. This is her first pregnancy. Her pregnancy has been complicated by excessive nausea and vomiting. This is been going on for the past couple of weeks. She has had an ultrasound which confirmed the presence of an intrauterine pregnancy. All of her lab work looked good. The patient states that for the past week she's vomited up all by mouth intake except for the last 2 days. No hematemesis, coffee-ground emesis, or bilious emesis. No abnormal pain or diarrhea. Her obstetrician noted that she had lost 4 pounds. She was seen there on September 9. She denies any fever or chills. She has had some left flank pain, but this is a chronic, ongoing problem which has been noted here on several occasions in the past. She was given promethazine and metoclopramide by her obstetrician but this has failed to help.  Review of Systems:  Other than noted above, the patient denies any of the following symptoms: Systemic:  No fevers, chills, sweats, weight loss or gain, fatigue, or tiredness. ENT:  No nasal congestion, rhinorrhea, or sore throat. Lungs:  No cough, wheezing, or shortness of breath. Cardiac:  No chest pain, syncope, or presyncope. GI:  No abdominal pain, nausea, vomiting, anorexia, diarrhea, constipation, blood in stool or vomitus. GU:  No dysuria, frequency, or urgency.  PMFSH:  Past medical history, family history, social history, meds, and allergies were reviewed.   Physical Exam:   Vital signs:  BP 107/70  Pulse 96  Temp(Src) 97.5 F (36.4 C) (Oral)  Resp 16  SpO2 100%  LMP 09/09/2012 Filed Vitals:   01/07/13 1300 01/07/13 1335 Supine  01/07/13 1337 Sitting  01/07/13 1339 Standing   BP: 100/68 104/68 107/69 107/70  Pulse:  85 87 80 96  Temp: 97.5 F (36.4 C)     TempSrc: Oral     Resp: 16     SpO2: 100%      General:  Alert and oriented.  In no distress.  Skin warm and dry.  Good skin turgor, brisk capillary refill. ENT:  No scleral icterus, moist mucous membranes, no oral lesions, pharynx clear. Lungs:  Breath sounds clear and equal bilaterally.  No wheezes, rales, or rhonchi. Heart:  Rhythm regular, without extrasystoles.  No gallops or murmers. Abdomen:  Soft, mildly distended, tender to palpation in epigastrium and periumbilical area. Also some tenderness to palpation in the suprapubic area as well. No guarding or rebound. No organomegaly or mass, with exception of a palpable uterus. Bowel sounds were normally active. Skin: Clear, warm, and dry.  Good turgor.  Brisk capillary refill.  Labs:   Results for orders placed during the hospital encounter of 01/07/13  POCT I-STAT, CHEM 8      Result Value Range   Sodium 136  135 - 145 mEq/L   Potassium 3.9  3.5 - 5.1 mEq/L   Chloride 102  96 - 112 mEq/L   BUN <3 (*) 6 - 23 mg/dL   Creatinine, Ser 8.29  0.50 - 1.10 mg/dL   Glucose, Bld 77  70 - 99 mg/dL   Calcium, Ion 5.62  1.30 - 1.23 mmol/L   TCO2 24  0 - 100 mmol/L   Hemoglobin 13.6  12.0 -  15.0 g/dL   HCT 44.0  10.2 - 72.5 %  POCT URINALYSIS DIP (DEVICE)      Result Value Range   Glucose, UA NEGATIVE  NEGATIVE mg/dL   Bilirubin Urine SMALL (*) NEGATIVE   Ketones, ur >=160 (*) NEGATIVE mg/dL   Specific Gravity, Urine >=1.030  1.005 - 1.030   Hgb urine dipstick NEGATIVE  NEGATIVE   pH 6.0  5.0 - 8.0   Protein, ur NEGATIVE  NEGATIVE mg/dL   Urobilinogen, UA 1.0  0.0 - 1.0 mg/dL   Nitrite NEGATIVE  NEGATIVE   Leukocytes, UA NEGATIVE  NEGATIVE     Course in Urgent Care Center:   She was given 1 L of normal saline intravenously and Zofran 4 mg IV. Thereafter she felt better. She did not vomit at all while she was here at urgent care Center.  Assessment:  The primary encounter diagnosis was  Hyperemesis gravidarum. A diagnosis of Encounter for fetal anatomic survey was also pertinent to this visit.  She has no laboratory abnormalities and no orthostatic change her vital signs. I spoke with Dr. Marice Potter at [facility] who was on-call for her gynecologist, Dr. Willodean Rosenthal. She felt that if her tests all look normal she could be discharged on by mouth Zofran and followup with their office tomorrow.  Plan:   1.  Meds:  The following meds were prescribed:   Discharge Medication List as of 01/07/2013  3:04 PM    START taking these medications   Details  ondansetron (ZOFRAN ODT) 8 MG disintegrating tablet Take 1 tablet (8 mg total) by mouth every 8 (eight) hours as needed for nausea., Starting 01/07/2013, Until Discontinued, Normal        2.  Patient Education/Counseling:  The patient was given appropriate handouts, self care instructions, and instructed in symptomatic relief. The patient was told to stay on clear liquids for the remainder of the day, then advance to a B.R.A.T. diet starting tomorrow. Instructed in dietary treatment of hyperemesis gravidarum.  3.  Follow up:  The patient was told to follow up if no better in 3 to 4 days, if becoming worse in any way, and given some red flag symptoms such as persistent vomiting or any vaginal bleeding or severe pain which would prompt immediate return.  Follow up with her obstetrician at [facility] tomorrow or early next week.       Reuben Likes, MD 01/07/13 [phone]

## 2013-01-08 ENCOUNTER — Other Ambulatory Visit: Payer: Self-pay | Admitting: Family Medicine

## 2013-01-08 DIAGNOSIS — Z3689 Encounter for other specified antenatal screening: Secondary | ICD-10-CM

## 2013-01-13 ENCOUNTER — Ambulatory Visit (HOSPITAL_COMMUNITY): Payer: Medicaid Other

## 2013-01-20 ENCOUNTER — Ambulatory Visit (HOSPITAL_COMMUNITY)
Admission: RE | Admit: 2013-01-20 | Discharge: 2013-01-20 | Disposition: A | Discharge status: Home / Self Care | Payer: Medicaid Other | Source: Ambulatory Visit | Attending: Family Medicine | Admitting: Family Medicine

## 2013-01-20 ENCOUNTER — Encounter: Payer: Self-pay | Admitting: Family Medicine

## 2013-01-20 DIAGNOSIS — O210 Mild hyperemesis gravidarum: Secondary | ICD-10-CM | POA: Insufficient documentation

## 2013-01-20 DIAGNOSIS — Z363 Encounter for antenatal screening for malformations: Secondary | ICD-10-CM | POA: Insufficient documentation

## 2013-01-20 DIAGNOSIS — Z3689 Encounter for other specified antenatal screening: Secondary | ICD-10-CM

## 2013-01-20 DIAGNOSIS — O358XX0 Maternal care for other (suspected) fetal abnormality and damage, not applicable or unspecified: Secondary | ICD-10-CM | POA: Insufficient documentation

## 2013-01-20 DIAGNOSIS — Z1389 Encounter for screening for other disorder: Secondary | ICD-10-CM | POA: Insufficient documentation

## 2013-01-20 NOTE — Progress Notes (Signed)
Kelsey Davies  was seen today for an ultrasound appointment.  See full report in [facility].  Impression: Single IUP at 19 0/7 weeks Normal fetal anatomic survey No markers associated with aneuploidy noted Posterior placenta without previa Normal amniotic fluid volume  Recommendations: Follow-up ultrasounds as clinically indicated.   Alpha Gula, MD

## 2013-01-26 ENCOUNTER — Ambulatory Visit (INDEPENDENT_AMBULATORY_CARE_PROVIDER_SITE_OTHER): Payer: Medicaid Other | Admitting: Obstetrics and Gynecology

## 2013-01-26 ENCOUNTER — Encounter: Payer: Self-pay | Admitting: Obstetrics and Gynecology

## 2013-01-26 VITALS — BP 103/69 | Temp 97.4°F | Wt 109.6 lb

## 2013-01-26 DIAGNOSIS — Z3400 Encounter for supervision of normal first pregnancy, unspecified trimester: Secondary | ICD-10-CM

## 2013-01-26 DIAGNOSIS — Z609 Problem related to social environment, unspecified: Secondary | ICD-10-CM

## 2013-01-26 DIAGNOSIS — Z789 Other specified health status: Secondary | ICD-10-CM

## 2013-01-26 DIAGNOSIS — Z3402 Encounter for supervision of normal first pregnancy, second trimester: Secondary | ICD-10-CM

## 2013-01-26 LAB — POCT URINALYSIS DIP (DEVICE)
Bilirubin Urine: NEGATIVE
Glucose, UA: NEGATIVE mg/dL
Hgb urine dipstick: NEGATIVE
Ketones, ur: NEGATIVE mg/dL
Nitrite: NEGATIVE
Protein, ur: NEGATIVE mg/dL
Specific Gravity, Urine: 1.02 (ref 1.005–1.030)
Urobilinogen, UA: 0.2 mg/dL (ref 0.0–1.0)
pH: 7.5 (ref 5.0–8.0)

## 2013-01-26 NOTE — Progress Notes (Signed)
P= 110 Pt. Reports feeling much better and hasn't had to use phenergan or zofran since last week.

## 2013-01-26 NOTE — Progress Notes (Signed)
Patient doing well without complaints. Ultrasound reviewed. AFP today

## 2013-01-27 ENCOUNTER — Encounter: Payer: Self-pay | Admitting: Obstetrics and Gynecology

## 2013-01-27 LAB — ALPHA FETOPROTEIN, MATERNAL
AFP: 29.3 IU/mL
Curr Gest Age: 19.6 wks.days

## 2013-02-23 ENCOUNTER — Encounter: Payer: Self-pay | Admitting: Family Medicine

## 2013-02-23 ENCOUNTER — Ambulatory Visit (INDEPENDENT_AMBULATORY_CARE_PROVIDER_SITE_OTHER): Payer: Medicaid Other | Admitting: Family Medicine

## 2013-02-23 VITALS — BP 101/69 | Temp 97.8°F | Wt 113.3 lb

## 2013-02-23 DIAGNOSIS — Z23 Encounter for immunization: Secondary | ICD-10-CM

## 2013-02-23 DIAGNOSIS — Z3402 Encounter for supervision of normal first pregnancy, second trimester: Secondary | ICD-10-CM

## 2013-02-23 DIAGNOSIS — Z3400 Encounter for supervision of normal first pregnancy, unspecified trimester: Secondary | ICD-10-CM

## 2013-02-23 LAB — POCT URINALYSIS DIP (DEVICE)
Ketones, ur: NEGATIVE mg/dL
Nitrite: NEGATIVE
Protein, ur: NEGATIVE mg/dL
Urobilinogen, UA: 0.2 mg/dL (ref 0.0–1.0)

## 2013-02-23 NOTE — Addendum Note (Signed)
Addended by: Candelaria Stagers E on: 02/23/2013 01:29 PM   Modules accepted: Orders

## 2013-02-23 NOTE — Progress Notes (Signed)
P - 112 

## 2013-02-23 NOTE — Patient Instructions (Signed)
Pregnancy - Second Trimester The second trimester of pregnancy (3 to 6 months) is a period of rapid growth for you and your baby. At the end of the sixth month, your baby is about 9 inches long and weighs 1 1/2 pounds. You will begin to feel the baby move between 18 and 20 weeks of the pregnancy. This is called quickening. Weight gain is faster. A clear fluid (colostrum) may leak out of your breasts. You may feel small contractions of the womb (uterus). This is known as false labor or Braxton-Hicks contractions. This is like a practice for labor when the baby is ready to be born. Usually, the problems with morning sickness have usually passed by the end of your first trimester. Some women develop small dark blotches (called cholasma, mask of pregnancy) on their face that usually goes away after the baby is born. Exposure to the sun makes the blotches worse. Acne may also develop in some pregnant women and pregnant women who have acne, may find that it goes away. PRENATAL EXAMS  Blood work may continue to be done during prenatal exams. These tests are done to check on your health and the probable health of your baby. Blood work is used to follow your blood levels (hemoglobin). Anemia (low hemoglobin) is common during pregnancy. Iron and vitamins are given to help prevent this. You will also be checked for diabetes between 24 and 28 weeks of the pregnancy. Some of the previous blood tests may be repeated.  The size of the uterus is measured during each visit. This is to make sure that the baby is continuing to grow properly according to the dates of the pregnancy.  Your blood pressure is checked every prenatal visit. This is to make sure you are not getting toxemia.  Your urine is checked to make sure you do not have an infection, diabetes or protein in the urine.  Your weight is checked often to make sure gains are happening at the suggested rate. This is to ensure that both you and your baby are  growing normally.  Sometimes, an ultrasound is performed to confirm the proper growth and development of the baby. This is a test which bounces harmless sound waves off the baby so your caregiver can more accurately determine due dates. Sometimes, a test is done on the amniotic fluid surrounding the baby. This test is called an amniocentesis. The amniotic fluid is obtained by sticking a needle into the belly (abdomen). This is done to check the chromosomes in instances where there is a concern about possible genetic problems with the baby. It is also sometimes done near the end of pregnancy if an early delivery is required. In this case, it is done to help make sure the baby's lungs are mature enough for the baby to live outside of the womb. CHANGES OCCURING IN THE SECOND TRIMESTER OF PREGNANCY Your body goes through many changes during pregnancy. They vary from person to person. Talk to your caregiver about changes you notice that you are concerned about.  During the second trimester, you will likely have an increase in your appetite. It is normal to have cravings for certain foods. This varies from person to person and pregnancy to pregnancy.  Your lower abdomen will begin to bulge.  You may have to urinate more often because the uterus and baby are pressing on your bladder. It is also common to get more bladder infections during pregnancy. You can help this by drinking lots of fluids   and emptying your bladder before and after intercourse.  You may begin to get stretch marks on your hips, abdomen, and breasts. These are normal changes in the body during pregnancy. There are no exercises or medicines to take that prevent this change.  You may begin to develop swollen and bulging veins (varicose veins) in your legs. Wearing support hose, elevating your feet for 15 minutes, 3 to 4 times a day and limiting salt in your diet helps lessen the problem.  Heartburn may develop as the uterus grows and  pushes up against the stomach. Antacids recommended by your caregiver helps with this problem. Also, eating smaller meals 4 to 5 times a day helps.  Constipation can be treated with a stool softener or adding bulk to your diet. Drinking lots of fluids, and eating vegetables, fruits, and whole grains are helpful.  Exercising is also helpful. If you have been very active up until your pregnancy, most of these activities can be continued during your pregnancy. If you have been less active, it is helpful to start an exercise program such as walking.  Hemorrhoids may develop at the end of the second trimester. Warm sitz baths and hemorrhoid cream recommended by your caregiver helps hemorrhoid problems.  Backaches may develop during this time of your pregnancy. Avoid heavy lifting, wear low heal shoes, and practice good posture to help with backache problems.  Some pregnant women develop tingling and numbness of their hand and fingers because of swelling and tightening of ligaments in the wrist (carpel tunnel syndrome). This goes away after the baby is born.  As your breasts enlarge, you may have to get a bigger bra. Get a comfortable, cotton, support bra. Do not get a nursing bra until the last month of the pregnancy if you will be nursing the baby.  You may get a dark line from your belly button to the pubic area called the linea nigra.  You may develop rosy cheeks because of increase blood flow to the face.  You may develop spider looking lines of the face, neck, arms, and chest. These go away after the baby is born. HOME CARE INSTRUCTIONS   It is extremely important to avoid all smoking, herbs, alcohol, and unprescribed drugs during your pregnancy. These chemicals affect the formation and growth of the baby. Avoid these chemicals throughout the pregnancy to ensure the delivery of a healthy infant.  Most of your home care instructions are the same as suggested for the first trimester of your  pregnancy. Keep your caregiver's appointments. Follow your caregiver's instructions regarding medicine use, exercise, and diet.  During pregnancy, you are providing food for you and your baby. Continue to eat regular, well-balanced meals. Choose foods such as meat, fish, milk and other low fat dairy products, vegetables, fruits, and whole-grain breads and cereals. Your caregiver will tell you of the ideal weight gain.  A physical sexual relationship may be continued up until near the end of pregnancy if there are no other problems. Problems could include early (premature) leaking of amniotic fluid from the membranes, vaginal bleeding, abdominal pain, or other medical or pregnancy problems.  Exercise regularly if there are no restrictions. Check with your caregiver if you are unsure of the safety of some of your exercises. The greatest weight gain will occur in the last 2 trimesters of pregnancy. Exercise will help you:  Control your weight.  Get you in shape for labor and delivery.  Lose weight after you have the baby.  Wear   a good support or jogging bra for breast tenderness during pregnancy. This may help if worn during sleep. Pads or tissues may be used in the bra if you are leaking colostrum.  Do not use hot tubs, steam rooms or saunas throughout the pregnancy.  Wear your seat belt at all times when driving. This protects you and your baby if you are in an accident.  Avoid raw meat, uncooked cheese, cat litter boxes, and soil used by cats. These carry germs that can cause birth defects in the baby.  The second trimester is also a good time to visit your dentist for your dental health if this has not been done yet. Getting your teeth cleaned is okay. Use a soft toothbrush. Brush gently during pregnancy.  It is easier to leak urine during pregnancy. Tightening up and strengthening the pelvic muscles will help with this problem. Practice stopping your urination while you are going to the  bathroom. These are the same muscles you need to strengthen. It is also the muscles you would use as if you were trying to stop from passing gas. You can practice tightening these muscles up 10 times a set and repeating this about 3 times per day. Once you know what muscles to tighten up, do not perform these exercises during urination. It is more likely to contribute to an infection by backing up the urine.  Ask for help if you have financial, counseling, or nutritional needs during pregnancy. Your caregiver will be able to offer counseling for these needs as well as refer you for other special needs.  Your skin may become oily. If so, wash your face with mild soap, use non-greasy moisturizer and oil or cream based makeup. MEDICINES AND DRUG USE IN PREGNANCY  Take prenatal vitamins as directed. The vitamin should contain 1 milligram of folic acid. Keep all vitamins out of reach of children. Only a couple vitamins or tablets containing iron may be fatal to a baby or young child when ingested.  Avoid use of all medicines, including herbs, over-the-counter medicines, not prescribed or suggested by your caregiver. Only take over-the-counter or prescription medicines for pain, discomfort, or fever as directed by your caregiver. Do not use aspirin.  Let your caregiver also know about herbs you may be using.  Alcohol is related to a number of birth defects. This includes fetal alcohol syndrome. All alcohol, in any form, should be avoided completely. Smoking will cause low birth rate and premature babies.  Street or illegal drugs are very harmful to the baby. They are absolutely forbidden. A baby born to an addicted mother will be addicted at birth. The baby will go through the same withdrawal an adult does. SEEK MEDICAL CARE IF:  You have any concerns or worries during your pregnancy. It is better to call with your questions if you feel they cannot wait, rather than worry about them. SEEK IMMEDIATE  MEDICAL CARE IF:   An unexplained oral temperature above 102 F (38.9 C) develops, or as your caregiver suggests.  You have leaking of fluid from the vagina (birth canal). If leaking membranes are suspected, take your temperature and tell your caregiver of this when you call.  There is vaginal spotting, bleeding, or passing clots. Tell your caregiver of the amount and how many pads are used. Light spotting in pregnancy is common, especially following intercourse.  You develop a bad smelling vaginal discharge with a change in the color from clear to white.  You continue to feel   sick to your stomach (nauseated) and have no relief from remedies suggested. You vomit blood or coffee ground-like materials.  You lose more than 2 pounds of weight or gain more than 2 pounds of weight over 1 week, or as suggested by your caregiver.  You notice swelling of your face, hands, feet, or legs.  You get exposed to German measles and have never had them.  You are exposed to fifth disease or chickenpox.  You develop belly (abdominal) pain. Round ligament discomfort is a common non-cancerous (benign) cause of abdominal pain in pregnancy. Your caregiver still must evaluate you.  You develop a bad headache that does not go away.  You develop fever, diarrhea, pain with urination, or shortness of breath.  You develop visual problems, blurry, or double vision.  You fall or are in a car accident or any kind of trauma.  There is mental or physical violence at home. Document Released: 04/02/2001 Document Revised: 01/01/2012 Document Reviewed: 10/05/2008 [employer] Patient Information 2014 [employer].  

## 2013-02-23 NOTE — Progress Notes (Signed)
Kelsey Davies is a 22 y.o. G1P0 at [redacted]w[redacted]d here for ROB visit.  +FM, no lof, no vb, no ctx  Pt endorses pain in left leg, pt reports pain when walking, pt has tried massage and different clothes with some improvement. Stable from April. No significant changes.   Discussed with Patient:  -Plans to breast feed.  All questions answered. -Continue prenatal vitamins. - Reviewed genetics screen (Quad screen / first trimester screen / serum integrated screen / full integrated screen done/ not done).   -Routine precautions discussed (depression, infection s/s).   Patient provided with all pertinent phone numbers for emergencies. - RTC for any VB, regular, painful cramps/ctxs occurring at a rate of >2/10 min, fever (100.5 or higher), n/v/d, any pain that is unresolving or worsening, LOF, decreased fetal movement, CP, SOB, edema  Problems: Patient Active Problem List   Diagnosis Date Noted  . Language barrier 12/01/2012  . Supervision of normal first pregnancy 10/26/2012  . Idiopathic scoliosis 09/25/2012    To Do: 1. glucola and cbc next visit 2. Flu today  [ ]  Vaccines: Flu: 11/4 Tdap:  [ ]  BCM: Undecided  Edu: [ x] PTL precautions; [ ]  BF class; [ ]  childbirth class; [ ]   BF counseling;

## 2013-02-24 ENCOUNTER — Encounter: Payer: Self-pay | Admitting: *Deleted

## 2013-03-15 ENCOUNTER — Ambulatory Visit: Payer: Medicaid Other | Attending: Internal Medicine | Admitting: Internal Medicine

## 2013-03-15 VITALS — BP 110/77 | HR 125 | Temp 98.9°F | Ht 59.0 in | Wt 115.8 lb

## 2013-03-15 DIAGNOSIS — M25569 Pain in unspecified knee: Secondary | ICD-10-CM

## 2013-03-15 DIAGNOSIS — M79609 Pain in unspecified limb: Secondary | ICD-10-CM | POA: Insufficient documentation

## 2013-03-15 NOTE — Progress Notes (Signed)
Pt is here for constant bilateral leg pain x2 months; pain on a level 10 today. Pt has not taken any medication for the pain.

## 2013-03-15 NOTE — Progress Notes (Signed)
Patient ID: Kelsey Davies, female   DOB: [birth year], 22 y.o.   MRN: [id number]  CC:  HPI: 22 year old female who is here for chronic back pain she states that she has had back pain and bilateral lower extremity pain for the last 8 years. She is currently [redacted] weeks along in her first pregnancy. She does not take anything over-the-counter at the moment She denies any numbness and tingling in her feet,  denies any bowel or bladder incontinence     No Known Allergies No past medical history on file. Current Outpatient Prescriptions on File Prior to Visit  Medication Sig Dispense Refill  . Prenatal Vit-Fe Fumarate-FA (GNP PRENATAL VITAMINS) 28-0.8 MG TABS Take 1 tablet by mouth daily.  30 tablet  10  . amoxicillin (AMOXIL) 500 MG capsule Take 500 mg by mouth 3 (three) times daily.      . metoCLOPramide (REGLAN) 10 MG tablet Take 1 tablet (10 mg total) by mouth 4 (four) times daily.  120 tablet  2  . ondansetron (ZOFRAN ODT) 8 MG disintegrating tablet Take 1 tablet (8 mg total) by mouth every 8 (eight) hours as needed for nausea.  20 tablet  0  . promethazine (PHENERGAN) 25 MG tablet Take 1 tablet (25 mg total) by mouth every 6 (six) hours as needed for nausea.  30 tablet  2   No current facility-administered medications on file prior to visit.   No family history on file. History   Social History  . Marital Status: Single    Spouse Name: N/A    Number of Children: N/A  . Years of Education: N/A   Occupational History  . Not on file.   Social History Main Topics  . Smoking status: Never Smoker   . Smokeless tobacco: Never Used  . Alcohol Use: No  . Drug Use: No  . Sexual Activity: Yes    Birth Control/ Protection: None   Other Topics Concern  . Not on file   Social History Narrative  . No narrative on file    Review of Systems  Constitutional: Negative for fever, chills, diaphoresis, activity change, appetite change and fatigue.  HENT: Negative for ear pain, nosebleeds,  congestion, facial swelling, rhinorrhea, neck pain, neck stiffness and ear discharge.   Eyes: Negative for pain, discharge, redness, itching and visual disturbance.  Respiratory: Negative for cough, choking, chest tightness, shortness of breath, wheezing and stridor.   Cardiovascular: Negative for chest pain, palpitations and leg swelling.  Gastrointestinal: Negative for abdominal distention.  Genitourinary: Negative for dysuria, urgency, frequency, hematuria, flank pain, decreased urine volume, difficulty urinating and dyspareunia.  Musculoskeletal: Negative for back pain, joint swelling, arthralgias and gait problem.  Neurological: Negative for dizziness, tremors, seizures, syncope, facial asymmetry, speech difficulty, weakness, light-headedness, numbness and headaches.  Hematological: Negative for adenopathy. Does not bruise/bleed easily.  Psychiatric/Behavioral: Negative for hallucinations, behavioral problems, confusion, dysphoric mood, decreased concentration and agitation.    Objective:   Filed Vitals:   03/15/13 1734  BP: 110/77  Pulse: 125  Temp: 98.9 F (37.2 C)    Physical Exam  Constitutional: Appears well-developed and well-nourished. No distress.  HENT: Normocephalic. External right and left ear normal. Oropharynx is clear and moist.  Eyes: Conjunctivae and EOM are normal. PERRLA, no scleral icterus.  Neck: Normal ROM. Neck supple. No JVD. No tracheal deviation. No thyromegaly.  CVS: RRR, S1/S2 +, no murmurs, no gallops, no carotid bruit.  Pulmonary: Effort and breath sounds normal, no stridor, rhonchi, wheezes, rales.  Abdominal:  Soft. BS +,  no distension, tenderness, rebound or guarding.  Musculoskeletal: Normal range of motion. No edema and no tenderness.  Lymphadenopathy: No lymphadenopathy noted, cervical, inguinal. Neuro: Alert. Normal reflexes, muscle tone coordination. No cranial nerve deficit. Skin: Skin is warm and dry. No rash noted. Not diaphoretic. No  erythema. No pallor.  Psychiatric: Normal mood and affect. Behavior, judgment, thought content normal.   Lab Results  Component Value Date   WBC 12.5* 12/01/2012   HGB 13.6 01/07/2013   HCT 40.0 01/07/2013   MCV 77.4* 12/01/2012   PLT 465* 12/01/2012   Lab Results  Component Value Date   CREATININE 0.50 01/07/2013   BUN <3* 01/07/2013   NA 136 01/07/2013   K 3.9 01/07/2013   CL 102 01/07/2013    No results found for this basename: HGBA1C   Lipid Panel  No results found for this basename: chol, trig, hdl, cholhdl, vldl, ldlcalc       Assessment and plan:   Patient Active Problem List   Diagnosis Date Noted  . Language barrier 12/01/2012  . Supervision of normal first pregnancy 10/26/2012  . Idiopathic scoliosis 09/25/2012   Bilateral lower extremity pain Patient continues to have symptoms for the last 8 years She does have a diagnosis of the idiopathic  scoliosis She had lumbar spinal x-rays done that showed mild scoliosis I have recommended that she use ben gay over-the-counter After her delivery the patient will need further imaging studies,    She may benefit from spine surgery consultation and further imaging studies such as an MRI of her hip and L-spine    The patient was given clear instructions to go to ER or return to medical center if symptoms don't improve, worsen or new problems develop. The patient verbalized understanding. The patient was told to call to get any lab results if not heard anything in the next week.

## 2013-03-23 ENCOUNTER — Ambulatory Visit (INDEPENDENT_AMBULATORY_CARE_PROVIDER_SITE_OTHER): Payer: Medicaid Other | Admitting: Family Medicine

## 2013-03-23 VITALS — BP 102/70 | Temp 97.1°F | Wt 111.3 lb

## 2013-03-23 DIAGNOSIS — Z3400 Encounter for supervision of normal first pregnancy, unspecified trimester: Secondary | ICD-10-CM

## 2013-03-23 DIAGNOSIS — Z3A28 28 weeks gestation of pregnancy: Secondary | ICD-10-CM

## 2013-03-23 DIAGNOSIS — Z23 Encounter for immunization: Secondary | ICD-10-CM

## 2013-03-23 DIAGNOSIS — Z331 Pregnant state, incidental: Secondary | ICD-10-CM

## 2013-03-23 DIAGNOSIS — Z3402 Encounter for supervision of normal first pregnancy, second trimester: Secondary | ICD-10-CM

## 2013-03-23 LAB — CBC
Hemoglobin: 11.2 g/dL — ABNORMAL LOW (ref 12.0–15.0)
MCHC: 32.9 g/dL (ref 30.0–36.0)
Platelets: 422 10*3/uL — ABNORMAL HIGH (ref 150–400)
RDW: 17.1 % — ABNORMAL HIGH (ref 11.5–15.5)

## 2013-03-23 LAB — POCT URINALYSIS DIP (DEVICE)
Nitrite: NEGATIVE
Protein, ur: NEGATIVE mg/dL
Urobilinogen, UA: 0.2 mg/dL (ref 0.0–1.0)

## 2013-03-23 MED ORDER — TETANUS-DIPHTH-ACELL PERTUSSIS 5-2.5-18.5 LF-MCG/0.5 IM SUSP: 0.5000 mL | Freq: Once | INTRAMUSCULAR | Status: DC

## 2013-03-23 NOTE — Patient Instructions (Signed)
Second Trimester of Pregnancy The second trimester is from week 13 through week 28, months 4 through 6. The second trimester is often a time when you feel your best. Your body has also adjusted to being pregnant, and you begin to feel better physically. Usually, morning sickness has lessened or quit completely, you may have more energy, and you may have an increase in appetite. The second trimester is also a time when the fetus is growing rapidly. At the end of the sixth month, the fetus is about 9 inches long and weighs about 1 pounds. You will likely begin to feel the baby move (quickening) between 18 and 20 weeks of the pregnancy. BODY CHANGES Your body goes through many changes during pregnancy. The changes vary from woman to woman.   Your weight will continue to increase. You will notice your lower abdomen bulging out.  You may begin to get stretch marks on your hips, abdomen, and breasts.  You may develop headaches that can be relieved by medicines approved by your caregiver.  You may urinate more often because the fetus is pressing on your bladder.  You may develop or continue to have heartburn as a result of your pregnancy.  You may develop constipation because certain hormones are causing the muscles that push waste through your intestines to slow down.  You may develop hemorrhoids or swollen, bulging veins (varicose veins).  You may have back pain because of the weight gain and pregnancy hormones relaxing your joints between the bones in your pelvis and as a result of a shift in weight and the muscles that support your balance.  Your breasts will continue to grow and be tender.  Your gums may bleed and may be sensitive to brushing and flossing.  Dark spots or blotches (chloasma, mask of pregnancy) may develop on your face. This will likely fade after the baby is born.  A dark line from your belly button to the pubic area (linea nigra) may appear. This will likely fade after the  baby is born. WHAT TO EXPECT AT YOUR PRENATAL VISITS During a routine prenatal visit:  You will be weighed to make sure you and the fetus are growing normally.  Your blood pressure will be taken.  Your abdomen will be measured to track your baby's growth.  The fetal heartbeat will be listened to.  Any test results from the previous visit will be discussed. Your caregiver may ask you:  How you are feeling.  If you are feeling the baby move.  If you have had any abnormal symptoms, such as leaking fluid, bleeding, severe headaches, or abdominal cramping.  If you have any questions. Other tests that may be performed during your second trimester include:  Blood tests that check for:  Low iron levels (anemia).  Gestational diabetes (between 24 and 28 weeks).  Rh antibodies.  Urine tests to check for infections, diabetes, or protein in the urine.  An ultrasound to confirm the proper growth and development of the baby.  An amniocentesis to check for possible genetic problems.  Fetal screens for spina bifida and Down syndrome. HOME CARE INSTRUCTIONS   Avoid all smoking, herbs, alcohol, and unprescribed drugs. These chemicals affect the formation and growth of the baby.  Follow your caregiver's instructions regarding medicine use. There are medicines that are either safe or unsafe to take during pregnancy.  Exercise only as directed by your caregiver. Experiencing uterine cramps is a good sign to stop exercising.  Continue to eat regular,   healthy meals.  Wear a good support bra for breast tenderness.  Do not use hot tubs, steam rooms, or saunas.  Wear your seat belt at all times when driving.  Avoid raw meat, uncooked cheese, cat litter boxes, and soil used by cats. These carry germs that can cause birth defects in the baby.  Take your prenatal vitamins.  Try taking a stool softener (if your caregiver approves) if you develop constipation. Eat more high-fiber foods,  such as fresh vegetables or fruit and whole grains. Drink plenty of fluids to keep your urine clear or pale yellow.  Take warm sitz baths to soothe any pain or discomfort caused by hemorrhoids. Use hemorrhoid cream if your caregiver approves.  If you develop varicose veins, wear support hose. Elevate your feet for 15 minutes, 3 4 times a day. Limit salt in your diet.  Avoid heavy lifting, wear low heel shoes, and practice good posture.  Rest with your legs elevated if you have leg cramps or low back pain.  Visit your dentist if you have not gone yet during your pregnancy. Use a soft toothbrush to brush your teeth and be gentle when you floss.  A sexual relationship may be continued unless your caregiver directs you otherwise.  Continue to go to all your prenatal visits as directed by your caregiver. SEEK MEDICAL CARE IF:   You have dizziness.  You have mild pelvic cramps, pelvic pressure, or nagging pain in the abdominal area.  You have persistent nausea, vomiting, or diarrhea.  You have a bad smelling vaginal discharge.  You have pain with urination. SEEK IMMEDIATE MEDICAL CARE IF:   You have a fever.  You are leaking fluid from your vagina.  You have spotting or bleeding from your vagina.  You have severe abdominal cramping or pain.  You have rapid weight gain or loss.  You have shortness of breath with chest pain.  You notice sudden or extreme swelling of your face, hands, ankles, feet, or legs.  You have not felt your baby move in over an hour.  You have severe headaches that do not go away with medicine.  You have vision changes. Document Released: 04/02/2001 Document Revised: 12/09/2012 Document Reviewed: 06/09/2012 [employer] Patient Information 2014 [employer].  

## 2013-03-23 NOTE — Progress Notes (Signed)
P=106 1 hour glucola/labs today

## 2013-03-23 NOTE — Progress Notes (Signed)
+  FM, no LOF, no VB, 1-2 small ctx/day  Kelsey Davies is a 22 y.o. G1P0 at [redacted]w[redacted]d here for ROB visit.  Discussed with Patient:  -Plans to bottle feed.  All questions answered. -Continue prenatal vitamins. -Reviewed fetal kick counts (Pt to perform daily at a time when the baby is active, lie laterally with both hands on belly in quiet room and count all movements (hiccups, shoulder rolls, obvious kicks, etc); pt is to report to clinic or [facility] for less than 10 movements felt in a one hour time period-pt told as soon as she counts 10 movements the count is complete.)  - Routine precautions discussed (depression, infection s/s).   Patient provided with all pertinent phone numbers for emergencies. - RTC for any VB, regular, painful cramps/ctxs occurring at a rate of >2/10 min, fever (100.5 or higher), n/v/d, any pain that is unresolving or worsening, LOF, decreased fetal movement, CP, SOB, edema  Problems: Patient Active Problem List   Diagnosis Date Noted  . Language barrier 12/01/2012  . Supervision of normal first pregnancy 10/26/2012  . Idiopathic scoliosis 09/25/2012    To Do: 1. Glucose tolerance test ordered.  Patient will draw in clinic.  Will f/u test and amend plan based on results. 2. CBC and antibody screen ordered.  [ ]  Vaccines: Flu: recd Tdap: today [ ]  BCM: POP/OCP  Edu: [ x] PTL precautions; [ ]  BF class; [ ]  childbirth class; [ ]   BF counseling;

## 2013-03-24 ENCOUNTER — Encounter: Payer: Self-pay | Admitting: *Deleted

## 2013-03-24 LAB — RPR

## 2013-03-24 LAB — GLUCOSE TOLERANCE, 1 HOUR (50G) W/O FASTING: Glucose, 1 Hour GTT: 84 mg/dL (ref 70–140)

## 2013-03-24 LAB — HIV ANTIBODY (ROUTINE TESTING W REFLEX): HIV: NONREACTIVE

## 2013-04-20 ENCOUNTER — Ambulatory Visit (INDEPENDENT_AMBULATORY_CARE_PROVIDER_SITE_OTHER): Payer: Medicaid Other | Admitting: Family Medicine

## 2013-04-20 VITALS — BP 106/72 | Temp 98.6°F | Wt 115.8 lb

## 2013-04-20 DIAGNOSIS — Z3400 Encounter for supervision of normal first pregnancy, unspecified trimester: Secondary | ICD-10-CM

## 2013-04-20 DIAGNOSIS — Z3403 Encounter for supervision of normal first pregnancy, third trimester: Secondary | ICD-10-CM

## 2013-04-20 LAB — POCT URINALYSIS DIP (DEVICE)
Glucose, UA: NEGATIVE mg/dL
Nitrite: NEGATIVE
Protein, ur: NEGATIVE mg/dL
Urobilinogen, UA: 0.2 mg/dL (ref 0.0–1.0)

## 2013-04-20 NOTE — Progress Notes (Signed)
+  Fm no lof no vb, rare contraction 2-3x/day  Kelsey Davies is a 22 y.o. G1P0 at [redacted]w[redacted]d here for ROB visit.  Discussed with Patient:  -Plans to breast feed.  All questions answered. -Continue prenatal vitamins. -Reviewed fetal kick counts Pt to perform daily at a time when the baby is active, lie laterally with both hands on belly in quiet room and count all movements (hiccups, shoulder rolls, obvious kicks, etc); pt is to report to clinic L&D for less than 10 movements felt in a one hour time period-pt told as soon as she counts 10 movements the count is complete.  - Routine precautions discussed (depression, infection s/s).   Patient provided with all pertinent phone numbers for emergencies. - RTC for any VB, regular, painful cramps/ctxs occurring at a rate of >2/10 min, fever (100.5 or higher), n/v/d, any pain that is unresolving or worsening, LOF, decreased fetal movement, CP, SOB, edema - RTC in 2 weeks for next appt.   Problems: Patient Active Problem List   Diagnosis Date Noted  . Language barrier 12/01/2012  . Supervision of normal first pregnancy 10/26/2012  . Idiopathic scoliosis 09/25/2012    To Do:   [ ]  Vaccines: Flu: recd Tdap: recd [ ]  BCM: OCP [ ]  Readiness: baby has a place to sleep, car seat, other baby necessities.  Edu: [x ] PTL precautions; [ ]  BF class; [ ]  childbirth class; [ ]   BF counseling;

## 2013-04-20 NOTE — Patient Instructions (Signed)
Third Trimester of Pregnancy  The third trimester is from week 29 through week 42, months 7 through 9. The third trimester is a time when the fetus is growing rapidly. At the end of the ninth month, the fetus is about 20 inches in length and weighs 6 10 pounds.   BODY CHANGES  Your body goes through many changes during pregnancy. The changes vary from woman to woman.    Your weight will continue to increase. You can expect to gain 25 35 pounds (11 16 kg) by the end of the pregnancy.   You may begin to get stretch marks on your hips, abdomen, and breasts.   You may urinate more often because the fetus is moving lower into your pelvis and pressing on your bladder.   You may develop or continue to have heartburn as a result of your pregnancy.   You may develop constipation because certain hormones are causing the muscles that push waste through your intestines to slow down.   You may develop hemorrhoids or swollen, bulging veins (varicose veins).   You may have pelvic pain because of the weight gain and pregnancy hormones relaxing your joints between the bones in your pelvis. Back aches may result from over exertion of the muscles supporting your posture.   Your breasts will continue to grow and be tender. A yellow discharge may leak from your breasts called colostrum.   Your belly button may stick out.   You may feel short of breath because of your expanding uterus.   You may notice the fetus "dropping," or moving lower in your abdomen.   You may have a bloody mucus discharge. This usually occurs a few days to a week before labor begins.   Your cervix becomes thin and soft (effaced) near your due date.  WHAT TO EXPECT AT YOUR PRENATAL EXAMS   You will have prenatal exams every 2 weeks until week 36. Then, you will have weekly prenatal exams. During a routine prenatal visit:   You will be weighed to make sure you and the fetus are growing normally.   Your blood pressure is taken.   Your abdomen will be  measured to track your baby's growth.   The fetal heartbeat will be listened to.   Any test results from the previous visit will be discussed.   You may have a cervical check near your due date to see if you have effaced.  At around 36 weeks, your caregiver will check your cervix. At the same time, your caregiver will also perform a test on the secretions of the vaginal tissue. This test is to determine if a type of bacteria, Group B streptococcus, is present. Your caregiver will explain this further.  Your caregiver may ask you:   What your birth plan is.   How you are feeling.   If you are feeling the baby move.   If you have had any abnormal symptoms, such as leaking fluid, bleeding, severe headaches, or abdominal cramping.   If you have any questions.  Other tests or screenings that may be performed during your third trimester include:   Blood tests that check for low iron levels (anemia).   Fetal testing to check the health, activity level, and growth of the fetus. Testing is done if you have certain medical conditions or if there are problems during the pregnancy.  FALSE LABOR  You may feel small, irregular contractions that eventually go away. These are called Braxton Hicks contractions, or   false labor. Contractions may last for hours, days, or even weeks before true labor sets in. If contractions come at regular intervals, intensify, or become painful, it is best to be seen by your caregiver.   SIGNS OF LABOR    Menstrual-like cramps.   Contractions that are 5 minutes apart or less.   Contractions that start on the top of the uterus and spread down to the lower abdomen and back.   A sense of increased pelvic pressure or back pain.   A watery or bloody mucus discharge that comes from the vagina.  If you have any of these signs before the 37th week of pregnancy, call your caregiver right away. You need to go to the hospital to get checked immediately.  HOME CARE INSTRUCTIONS    Avoid all  smoking, herbs, alcohol, and unprescribed drugs. These chemicals affect the formation and growth of the baby.   Follow your caregiver's instructions regarding medicine use. There are medicines that are either safe or unsafe to take during pregnancy.   Exercise only as directed by your caregiver. Experiencing uterine cramps is a good sign to stop exercising.   Continue to eat regular, healthy meals.   Wear a good support bra for breast tenderness.   Do not use hot tubs, steam rooms, or saunas.   Wear your seat belt at all times when driving.   Avoid raw meat, uncooked cheese, cat litter boxes, and soil used by cats. These carry germs that can cause birth defects in the baby.   Take your prenatal vitamins.   Try taking a stool softener (if your caregiver approves) if you develop constipation. Eat more high-fiber foods, such as fresh vegetables or fruit and whole grains. Drink plenty of fluids to keep your urine clear or pale yellow.   Take warm sitz baths to soothe any pain or discomfort caused by hemorrhoids. Use hemorrhoid cream if your caregiver approves.   If you develop varicose veins, wear support hose. Elevate your feet for 15 minutes, 3 4 times a day. Limit salt in your diet.   Avoid heavy lifting, wear low heal shoes, and practice good posture.   Rest a lot with your legs elevated if you have leg cramps or low back pain.   Visit your dentist if you have not gone during your pregnancy. Use a soft toothbrush to brush your teeth and be gentle when you floss.   A sexual relationship may be continued unless your caregiver directs you otherwise.   Do not travel far distances unless it is absolutely necessary and only with the approval of your caregiver.   Take prenatal classes to understand, practice, and ask questions about the labor and delivery.   Make a trial run to the hospital.   Pack your hospital bag.   Prepare the baby's nursery.   Continue to go to all your prenatal visits as directed  by your caregiver.  SEEK MEDICAL CARE IF:   You are unsure if you are in labor or if your water has broken.   You have dizziness.   You have mild pelvic cramps, pelvic pressure, or nagging pain in your abdominal area.   You have persistent nausea, vomiting, or diarrhea.   You have a bad smelling vaginal discharge.   You have pain with urination.  SEEK IMMEDIATE MEDICAL CARE IF:    You have a fever.   You are leaking fluid from your vagina.   You have spotting or bleeding from your vagina.     You have severe abdominal cramping or pain.   You have rapid weight loss or gain.   You have shortness of breath with chest pain.   You notice sudden or extreme swelling of your face, hands, ankles, feet, or legs.   You have not felt your baby move in over an hour.   You have severe headaches that do not go away with medicine.   You have vision changes.  Document Released: 04/02/2001 Document Revised: 12/09/2012 Document Reviewed: 06/09/2012  [employer] Patient Information 2014 [employer].

## 2013-04-20 NOTE — Progress Notes (Signed)
Pulse- 133 Patient reports abdominal pain

## 2013-05-06 ENCOUNTER — Ambulatory Visit (INDEPENDENT_AMBULATORY_CARE_PROVIDER_SITE_OTHER): Payer: Medicaid Other | Admitting: Obstetrics and Gynecology

## 2013-05-06 VITALS — BP 108/75 | Wt 115.0 lb

## 2013-05-06 DIAGNOSIS — Z3403 Encounter for supervision of normal first pregnancy, third trimester: Secondary | ICD-10-CM

## 2013-05-06 DIAGNOSIS — Z3400 Encounter for supervision of normal first pregnancy, unspecified trimester: Secondary | ICD-10-CM

## 2013-05-06 LAB — POCT URINALYSIS DIP (DEVICE)
Bilirubin Urine: NEGATIVE
Glucose, UA: NEGATIVE mg/dL
HGB URINE DIPSTICK: NEGATIVE
KETONES UR: NEGATIVE mg/dL
Nitrite: NEGATIVE
PH: 7 (ref 5.0–8.0)
PROTEIN: NEGATIVE mg/dL
SPECIFIC GRAVITY, URINE: 1.015 (ref 1.005–1.030)
Urobilinogen, UA: 0.2 mg/dL (ref 0.0–1.0)

## 2013-05-06 NOTE — Progress Notes (Signed)
Complains of mild uterine contractions, 1-2x/day Good fetal movt, no vaginal bleeding or loss of fluid Plans to breast feed Pt desires pills for contraception postpartum F/U in 2 weeks

## 2013-05-06 NOTE — Patient Instructions (Signed)
Third Trimester of Pregnancy  The third trimester is from week 29 through week 42, months 7 through 9. The third trimester is a time when the fetus is growing rapidly. At the end of the ninth month, the fetus is about 20 inches in length and weighs 6 10 pounds.   BODY CHANGES  Your body goes through many changes during pregnancy. The changes vary from woman to woman.    Your weight will continue to increase. You can expect to gain 25 35 pounds (11 16 kg) by the end of the pregnancy.   You may begin to get stretch marks on your hips, abdomen, and breasts.   You may urinate more often because the fetus is moving lower into your pelvis and pressing on your bladder.   You may develop or continue to have heartburn as a result of your pregnancy.   You may develop constipation because certain hormones are causing the muscles that push waste through your intestines to slow down.   You may develop hemorrhoids or swollen, bulging veins (varicose veins).   You may have pelvic pain because of the weight gain and pregnancy hormones relaxing your joints between the bones in your pelvis. Back aches may result from over exertion of the muscles supporting your posture.   Your breasts will continue to grow and be tender. A yellow discharge may leak from your breasts called colostrum.   Your belly button may stick out.   You may feel short of breath because of your expanding uterus.   You may notice the fetus "dropping," or moving lower in your abdomen.   You may have a bloody mucus discharge. This usually occurs a few days to a week before labor begins.   Your cervix becomes thin and soft (effaced) near your due date.  WHAT TO EXPECT AT YOUR PRENATAL EXAMS   You will have prenatal exams every 2 weeks until week 36. Then, you will have weekly prenatal exams. During a routine prenatal visit:   You will be weighed to make sure you and the fetus are growing normally.   Your blood pressure is taken.   Your abdomen will be  measured to track your baby's growth.   The fetal heartbeat will be listened to.   Any test results from the previous visit will be discussed.   You may have a cervical check near your due date to see if you have effaced.  At around 36 weeks, your caregiver will check your cervix. At the same time, your caregiver will also perform a test on the secretions of the vaginal tissue. This test is to determine if a type of bacteria, Group B streptococcus, is present. Your caregiver will explain this further.  Your caregiver may ask you:   What your birth plan is.   How you are feeling.   If you are feeling the baby move.   If you have had any abnormal symptoms, such as leaking fluid, bleeding, severe headaches, or abdominal cramping.   If you have any questions.  Other tests or screenings that may be performed during your third trimester include:   Blood tests that check for low iron levels (anemia).   Fetal testing to check the health, activity level, and growth of the fetus. Testing is done if you have certain medical conditions or if there are problems during the pregnancy.  FALSE LABOR  You may feel small, irregular contractions that eventually go away. These are called Braxton Hicks contractions, or   false labor. Contractions may last for hours, days, or even weeks before true labor sets in. If contractions come at regular intervals, intensify, or become painful, it is best to be seen by your caregiver.   SIGNS OF LABOR    Menstrual-like cramps.   Contractions that are 5 minutes apart or less.   Contractions that start on the top of the uterus and spread down to the lower abdomen and back.   A sense of increased pelvic pressure or back pain.   A watery or bloody mucus discharge that comes from the vagina.  If you have any of these signs before the 37th week of pregnancy, call your caregiver right away. You need to go to the hospital to get checked immediately.  HOME CARE INSTRUCTIONS    Avoid all  smoking, herbs, alcohol, and unprescribed drugs. These chemicals affect the formation and growth of the baby.   Follow your caregiver's instructions regarding medicine use. There are medicines that are either safe or unsafe to take during pregnancy.   Exercise only as directed by your caregiver. Experiencing uterine cramps is a good sign to stop exercising.   Continue to eat regular, healthy meals.   Wear a good support bra for breast tenderness.   Do not use hot tubs, steam rooms, or saunas.   Wear your seat belt at all times when driving.   Avoid raw meat, uncooked cheese, cat litter boxes, and soil used by cats. These carry germs that can cause birth defects in the baby.   Take your prenatal vitamins.   Try taking a stool softener (if your caregiver approves) if you develop constipation. Eat more high-fiber foods, such as fresh vegetables or fruit and whole grains. Drink plenty of fluids to keep your urine clear or pale yellow.   Take warm sitz baths to soothe any pain or discomfort caused by hemorrhoids. Use hemorrhoid cream if your caregiver approves.   If you develop varicose veins, wear support hose. Elevate your feet for 15 minutes, 3 4 times a day. Limit salt in your diet.   Avoid heavy lifting, wear low heal shoes, and practice good posture.   Rest a lot with your legs elevated if you have leg cramps or low back pain.   Visit your dentist if you have not gone during your pregnancy. Use a soft toothbrush to brush your teeth and be gentle when you floss.   A sexual relationship may be continued unless your caregiver directs you otherwise.   Do not travel far distances unless it is absolutely necessary and only with the approval of your caregiver.   Take prenatal classes to understand, practice, and ask questions about the labor and delivery.   Make a trial run to the hospital.   Pack your hospital bag.   Prepare the baby's nursery.   Continue to go to all your prenatal visits as directed  by your caregiver.  SEEK MEDICAL CARE IF:   You are unsure if you are in labor or if your water has broken.   You have dizziness.   You have mild pelvic cramps, pelvic pressure, or nagging pain in your abdominal area.   You have persistent nausea, vomiting, or diarrhea.   You have a bad smelling vaginal discharge.   You have pain with urination.  SEEK IMMEDIATE MEDICAL CARE IF:    You have a fever.   You are leaking fluid from your vagina.   You have spotting or bleeding from your vagina.     You have severe abdominal cramping or pain.   You have rapid weight loss or gain.   You have shortness of breath with chest pain.   You notice sudden or extreme swelling of your face, hands, ankles, feet, or legs.   You have not felt your baby move in over an hour.   You have severe headaches that do not go away with medicine.   You have vision changes.  Document Released: 04/02/2001 Document Revised: 12/09/2012 Document Reviewed: 06/09/2012  [employer] Patient Information 2014 [employer].

## 2013-05-20 ENCOUNTER — Ambulatory Visit (INDEPENDENT_AMBULATORY_CARE_PROVIDER_SITE_OTHER): Payer: Medicaid Other | Admitting: Obstetrics & Gynecology

## 2013-05-20 VITALS — BP 104/71 | Temp 98.1°F | Wt 117.4 lb

## 2013-05-20 DIAGNOSIS — Z3400 Encounter for supervision of normal first pregnancy, unspecified trimester: Secondary | ICD-10-CM

## 2013-05-20 DIAGNOSIS — Z789 Other specified health status: Secondary | ICD-10-CM

## 2013-05-20 DIAGNOSIS — Z609 Problem related to social environment, unspecified: Secondary | ICD-10-CM

## 2013-05-20 LAB — POCT URINALYSIS DIP (DEVICE)
Bilirubin Urine: NEGATIVE
GLUCOSE, UA: NEGATIVE mg/dL
Ketones, ur: NEGATIVE mg/dL
Nitrite: NEGATIVE
PH: 7 (ref 5.0–8.0)
Protein, ur: NEGATIVE mg/dL
SPECIFIC GRAVITY, URINE: 1.02 (ref 1.005–1.030)
UROBILINOGEN UA: 0.2 mg/dL (ref 0.0–1.0)

## 2013-05-20 LAB — OB RESULTS CONSOLE GC/CHLAMYDIA
Chlamydia: NEGATIVE
GC PROBE AMP, GENITAL: NEGATIVE

## 2013-05-20 LAB — OB RESULTS CONSOLE GBS: STREP GROUP B AG: NEGATIVE

## 2013-05-20 NOTE — Progress Notes (Signed)
P= 100 [place] interpreter used.

## 2013-05-20 NOTE — Patient Instructions (Signed)
Return to clinic for any obstetric concerns or go to [facility] for evaluation  

## 2013-05-20 NOTE — Progress Notes (Signed)
[place] phone interpreter used. Pelvic cultures done today.  No other complaints or concerns.  Fetal movement and labor precautions reviewed.

## 2013-05-21 LAB — GC/CHLAMYDIA PROBE AMP
CT Probe RNA: NEGATIVE
GC PROBE AMP APTIMA: NEGATIVE

## 2013-05-21 LAB — CULTURE, BETA STREP (GROUP B ONLY)

## 2013-05-24 ENCOUNTER — Encounter: Payer: Self-pay | Admitting: Obstetrics & Gynecology

## 2013-05-30 ENCOUNTER — Encounter (HOSPITAL_COMMUNITY): Payer: Self-pay | Admitting: *Deleted

## 2013-05-30 ENCOUNTER — Inpatient Hospital Stay (HOSPITAL_COMMUNITY)
Admission: AD | Admit: 2013-05-30 | Discharge: 2013-05-30 | Disposition: A | Discharge status: Home / Self Care | Payer: Medicaid Other | Source: Ambulatory Visit | Attending: Obstetrics & Gynecology | Admitting: Obstetrics & Gynecology

## 2013-05-30 DIAGNOSIS — Z3689 Encounter for other specified antenatal screening: Secondary | ICD-10-CM

## 2013-05-30 DIAGNOSIS — M545 Low back pain, unspecified: Secondary | ICD-10-CM | POA: Insufficient documentation

## 2013-05-30 DIAGNOSIS — Z3400 Encounter for supervision of normal first pregnancy, unspecified trimester: Secondary | ICD-10-CM

## 2013-05-30 DIAGNOSIS — O368190 Decreased fetal movements, unspecified trimester, not applicable or unspecified: Secondary | ICD-10-CM | POA: Insufficient documentation

## 2013-05-30 HISTORY — DX: Other specified health status: Z78.9

## 2013-05-30 NOTE — MAU Provider Note (Signed)
  History     CSN: [id number]  Arrival date and time: 05/30/13 1322   First Provider Initiated Contact with Patient 05/30/13 1516      Chief Complaint  Patient presents with  . Decreased Fetal Movement   HPI Ms. Claybon JabsMadhura Hagarty is a 23 y.o. G1P0 at 4310w4d who presents to MAU today with complaint of decreased fetal movement x 3 days. The patient states that she has felt more movement since arrival in MAU. She also states that she had not been drinking or eating much today. She denies vaginal bleeding, LOF, contractions or pain. She states occasional low back pain with ambulation, but denies pain now. She is having a very small amount of thin, white, non-odorous discharge. [facility]   OB History   Grav Para Term Preterm Abortions TAB SAB Ect Mult Living   1               Past Medical History  Diagnosis Date  . Medical history non-contributory     Past Surgical History  Procedure Laterality Date  . No past surgeries      Family History  Problem Relation Age of Onset  . Hearing loss Neg Hx     History  Substance Use Topics  . Smoking status: Never Smoker   . Smokeless tobacco: Never Used  . Alcohol Use: No    Allergies: No Known Allergies  No prescriptions prior to admission    Review of Systems  Gastrointestinal: Negative for nausea, vomiting, abdominal pain, diarrhea and constipation.  Genitourinary: Negative for dysuria, urgency and frequency.       Neg - vaginal bleeding, LOF + vaginal discharge   Physical Exam   Blood pressure 95/61, pulse 114, temperature 97.9 F (36.6 C), temperature source Oral, resp. rate 16, last menstrual period 09/09/2012.  Physical Exam  Constitutional: She is oriented to person, place, and time. She appears well-developed and well-nourished. No distress.  HENT:  Head: Normocephalic and atraumatic.  Cardiovascular: Tachycardia present.   Respiratory: Effort normal.  GI: Soft. She exhibits no distension  and no mass. There is no tenderness. There is no rebound and no guarding.  Neurological: She is alert and oriented to person, place, and time.  Skin: Skin is warm and dry. No erythema.  Psychiatric: She has a normal mood and affect.   Fetal Monitoring: Baseline: 130 bpm, moderate variability, + accelerations, no decelerations Contractions: none  MAU Course  Procedures None  MDM Reactive NST without contractions Patient feels improved FM since arrival Assessment and Plan  A: Reactive NST  P: Discharge home Fetal kick counts and labor precautions discussed Patient [facility] as scheduled Patient may return to MAU as needed or if her condition were to change or worsen  Freddi StarrJulie N Ethier, PA-C  05/30/2013, 3:28 PM

## 2013-05-30 NOTE — MAU Note (Signed)
Decreased fetal movement, has not felt the baby move in 3 days. Intermittent back pain. No bleeding, has  Been leaking for a wk- " dr said it was normal"

## 2013-05-30 NOTE — MAU Note (Signed)
Questioning leaking further,  Is white,not watery.  Denies pain since here and resting.  Is now feeling movement since been on monitor.  Reactive tracing.  Discussed fluid intake- pt needs to increase.  One glass every hour while awake.  Kick counts and  labor precautions reviewed.  Comfort measures for back discussed.

## 2013-05-30 NOTE — Discharge Instructions (Signed)
Braxton Hicks Contractions Pregnancy is commonly associated with contractions of the uterus throughout the pregnancy. Towards the end of pregnancy (32 to 34 weeks), these contractions (Braxton Hicks) can develop more often and may become more forceful. This is not true labor because these contractions do not result in opening (dilatation) and thinning of the cervix. They are sometimes difficult to tell apart from true labor because these contractions can be forceful and people have different pain tolerances. You should not feel embarrassed if you go to the hospital with false labor. Sometimes, the only way to tell if you are in true labor is for your caregiver to follow the changes in the cervix. How to tell the difference between true and false labor:  False labor.  The contractions of false labor are usually shorter, irregular and not as hard as those of true labor.  They are often felt in the front of the lower abdomen and in the groin.  They may leave with walking around or changing positions while lying down.  They get weaker and are shorter lasting as time goes on.  These contractions are usually irregular.  They do not usually become progressively stronger, regular and closer together as with true labor.  True labor.  Contractions in true labor last 30 to 70 seconds, become very regular, usually become more intense, and increase in frequency.  They do not go away with walking.  The discomfort is usually felt in the top of the uterus and spreads to the lower abdomen and low back.  True labor can be determined by your caregiver with an exam. This will show that the cervix is dilating and getting thinner. If there are no prenatal problems or other health problems associated with the pregnancy, it is completely safe to be sent home with false labor and await the onset of true labor. HOME CARE INSTRUCTIONS   Keep up with your usual exercises and instructions.  Take medications as  directed.  Keep your regular prenatal appointment.  Eat and drink lightly if you think you are going into labor.  If BH contractions are making you uncomfortable:  Change your activity position from lying down or resting to walking/walking to resting.  Sit and rest in a tub of warm water.  Drink 2 to 3 glasses of water. Dehydration may cause B-H contractions.  Do slow and deep breathing several times an hour. SEEK IMMEDIATE MEDICAL CARE IF:   Your contractions continue to become stronger, more regular, and closer together.  You have a gushing, burst or leaking of fluid from the vagina.  An oral temperature above 102 F (38.9 C) develops.  You have passage of blood-tinged mucus.  You develop vaginal bleeding.  You develop continuous belly (abdominal) pain.  You have low back pain that you never had before.  You feel the baby's head pushing down causing pelvic pressure.  The baby is not moving as much as it used to. Document Released: 04/08/2005 Document Revised: 07/01/2011 Document Reviewed: 01/18/2013 ExitCare Patient Information 2014 [employer].  Fetal Movement Counts Patient Name: __________________________________________________ Patient Due Date: ____________________ Performing a fetal movement count is highly recommended in high-risk pregnancies, but it is good for every pregnant woman to do. Your caregiver may ask you to start counting fetal movements at 28 weeks of the pregnancy. Fetal movements often increase:  After eating a full meal.  After physical activity.  After eating or drinking something sweet or cold.  At rest. Pay attention to when you feel   the baby is most active. This will help you notice a pattern of your baby's sleep and wake cycles and what factors contribute to an increase in fetal movement. It is important to perform a fetal movement count at the same time each day when your baby is normally most active.  HOW TO COUNT FETAL  MOVEMENTS 1. Find a quiet and comfortable area to sit or lie down on your left side. Lying on your left side provides the best blood and oxygen circulation to your baby. 2. Write down the day and time on a sheet of paper or in a journal. 3. Start counting kicks, flutters, swishes, rolls, or jabs in a 2 hour period. You should feel at least 10 movements within 2 hours. 4. If you do not feel 10 movements in 2 hours, wait 2 3 hours and count again. Look for a change in the pattern or not enough counts in 2 hours. SEEK MEDICAL CARE IF:  You feel less than 10 counts in 2 hours, tried twice.  There is no movement in over an hour.  The pattern is changing or taking longer each day to reach 10 counts in 2 hours.  You feel the baby is not moving as he or she usually does. Date: ____________ Movements: ____________ Start time: ____________ Finish time: ____________  Date: ____________ Movements: ____________ Start time: ____________ Finish time: ____________ Date: ____________ Movements: ____________ Start time: ____________ Finish time: ____________ Date: ____________ Movements: ____________ Start time: ____________ Finish time: ____________ Date: ____________ Movements: ____________ Start time: ____________ Finish time: ____________ Date: ____________ Movements: ____________ Start time: ____________ Finish time: ____________ Date: ____________ Movements: ____________ Start time: ____________ Finish time: ____________ Date: ____________ Movements: ____________ Start time: ____________ Finish time: ____________  Date: ____________ Movements: ____________ Start time: ____________ Finish time: ____________ Date: ____________ Movements: ____________ Start time: ____________ Finish time: ____________ Date: ____________ Movements: ____________ Start time: ____________ Finish time: ____________ Date: ____________ Movements: ____________ Start time: ____________ Finish time: ____________ Date: ____________  Movements: ____________ Start time: ____________ Finish time: ____________ Date: ____________ Movements: ____________ Start time: ____________ Finish time: ____________ Date: ____________ Movements: ____________ Start time: ____________ Finish time: ____________  Date: ____________ Movements: ____________ Start time: ____________ Finish time: ____________ Date: ____________ Movements: ____________ Start time: ____________ Finish time: ____________ Date: ____________ Movements: ____________ Start time: ____________ Finish time: ____________ Date: ____________ Movements: ____________ Start time: ____________ Finish time: ____________ Date: ____________ Movements: ____________ Start time: ____________ Finish time: ____________ Date: ____________ Movements: ____________ Start time: ____________ Finish time: ____________ Date: ____________ Movements: ____________ Start time: ____________ Finish time: ____________  Date: ____________ Movements: ____________ Start time: ____________ Finish time: ____________ Date: ____________ Movements: ____________ Start time: ____________ Finish time: ____________ Date: ____________ Movements: ____________ Start time: ____________ Finish time: ____________ Date: ____________ Movements: ____________ Start time: ____________ Finish time: ____________ Date: ____________ Movements: ____________ Start time: ____________ Finish time: ____________ Date: ____________ Movements: ____________ Start time: ____________ Finish time: ____________ Date: ____________ Movements: ____________ Start time: ____________ Finish time: ____________  Date: ____________ Movements: ____________ Start time: ____________ Finish time: ____________ Date: ____________ Movements: ____________ Start time: ____________ Finish time: ____________ Date: ____________ Movements: ____________ Start time: ____________ Finish time: ____________ Date: ____________ Movements: ____________ Start time:  ____________ Finish time: ____________ Date: ____________ Movements: ____________ Start time: ____________ Finish time: ____________ Date: ____________ Movements: ____________ Start time: ____________ Finish time: ____________ Date: ____________ Movements: ____________ Start time: ____________ Finish time: ____________  Date: ____________ Movements: ____________ Start time: ____________ Finish time: ____________ Date: ____________ Movements: ____________ Start   time: ____________ Finish time: ____________ Date: ____________ Movements: ____________ Start time: ____________ Finish time: ____________ Date: ____________ Movements: ____________ Start time: ____________ Finish time: ____________ Date: ____________ Movements: ____________ Start time: ____________ Finish time: ____________ Date: ____________ Movements: ____________ Start time: ____________ Finish time: ____________ Date: ____________ Movements: ____________ Start time: ____________ Finish time: ____________  Date: ____________ Movements: ____________ Start time: ____________ Finish time: ____________ Date: ____________ Movements: ____________ Start time: ____________ Finish time: ____________ Date: ____________ Movements: ____________ Start time: ____________ Finish time: ____________ Date: ____________ Movements: ____________ Start time: ____________ Finish time: ____________ Date: ____________ Movements: ____________ Start time: ____________ Finish time: ____________ Date: ____________ Movements: ____________ Start time: ____________ Finish time: ____________ Date: ____________ Movements: ____________ Start time: ____________ Finish time: ____________  Date: ____________ Movements: ____________ Start time: ____________ Finish time: ____________ Date: ____________ Movements: ____________ Start time: ____________ Finish time: ____________ Date: ____________ Movements: ____________ Start time: ____________ Finish time: ____________ Date:  ____________ Movements: ____________ Start time: ____________ Finish time: ____________ Date: ____________ Movements: ____________ Start time: ____________ Finish time: ____________ Date: ____________ Movements: ____________ Start time: ____________ Finish time: ____________ Document Released: 05/08/2006 Document Revised: 03/25/2012 Document Reviewed: 02/03/2012 ExitCare Patient Information 2014 [employer].  

## 2013-06-03 ENCOUNTER — Encounter: Payer: Self-pay | Admitting: Obstetrics and Gynecology

## 2013-06-03 ENCOUNTER — Ambulatory Visit (INDEPENDENT_AMBULATORY_CARE_PROVIDER_SITE_OTHER): Payer: Medicaid Other | Admitting: Obstetrics and Gynecology

## 2013-06-03 VITALS — BP 103/75 | Temp 97.4°F | Wt 117.5 lb

## 2013-06-03 DIAGNOSIS — Z603 Acculturation difficulty: Secondary | ICD-10-CM

## 2013-06-03 DIAGNOSIS — Z609 Problem related to social environment, unspecified: Secondary | ICD-10-CM

## 2013-06-03 DIAGNOSIS — Z3400 Encounter for supervision of normal first pregnancy, unspecified trimester: Secondary | ICD-10-CM

## 2013-06-03 DIAGNOSIS — Z789 Other specified health status: Secondary | ICD-10-CM

## 2013-06-03 LAB — POCT URINALYSIS DIP (DEVICE)
BILIRUBIN URINE: NEGATIVE
Glucose, UA: NEGATIVE mg/dL
Hgb urine dipstick: NEGATIVE
KETONES UR: NEGATIVE mg/dL
Nitrite: NEGATIVE
PH: 7.5 (ref 5.0–8.0)
Protein, ur: NEGATIVE mg/dL
Specific Gravity, Urine: 1.025 (ref 1.005–1.030)
Urobilinogen, UA: 0.2 mg/dL (ref 0.0–1.0)

## 2013-06-03 NOTE — Progress Notes (Signed)
P-103 

## 2013-06-03 NOTE — Progress Notes (Signed)
Patient is doing well without complaints. FM/labor precautions reviewed 

## 2013-06-10 ENCOUNTER — Encounter: Payer: Self-pay | Admitting: Family Medicine

## 2013-06-10 ENCOUNTER — Ambulatory Visit (INDEPENDENT_AMBULATORY_CARE_PROVIDER_SITE_OTHER): Payer: Medicaid Other | Admitting: Family Medicine

## 2013-06-10 VITALS — BP 102/69 | Wt 118.8 lb

## 2013-06-10 DIAGNOSIS — Z3400 Encounter for supervision of normal first pregnancy, unspecified trimester: Secondary | ICD-10-CM

## 2013-06-10 LAB — POCT URINALYSIS DIP (DEVICE)
Bilirubin Urine: NEGATIVE
GLUCOSE, UA: NEGATIVE mg/dL
Hgb urine dipstick: NEGATIVE
KETONES UR: NEGATIVE mg/dL
Nitrite: NEGATIVE
Protein, ur: NEGATIVE mg/dL
SPECIFIC GRAVITY, URINE: 1.02 (ref 1.005–1.030)
Urobilinogen, UA: 1 mg/dL (ref 0.0–1.0)
pH: 7 (ref 5.0–8.0)

## 2013-06-10 NOTE — Progress Notes (Signed)
P: 99

## 2013-06-10 NOTE — Progress Notes (Signed)
+  fm, no lof, no vb, occ ctx No cervical check  Kelsey Davies is a 23 y.o. G1P0 at 5125w1d by L=13 here for ROB visit.    Discussed with Patient:  - Plans to breast/bottle feed.  All questions answered. - Continue prenatal vitamins. - Reviewed fetal kick counts Pt to perform daily at a time when the baby is active, lie laterally with both hands on belly in quiet room and count all movements (hiccups, shoulder rolls, obvious kicks, etc); pt is to report to clinic MAU for less than 10 movements felt in a 2 hour time period-pt told as soon as she counts 10 movements the count is complete.  - Routine precautions discussed (depression, infection s/s).   Patient provided with all pertinent phone numbers for emergencies. - RTC for any VB, regular, painful cramps/ctxs occurring at a rate of >2/10 min, fever (100.5 or higher), n/v/d, any pain that is unresolving or worsening, LOF, decreased fetal movement, CP, SOB, edema -RTC in one week for next visit.  Problems: Patient Active Problem List   Diagnosis Date Noted  . Language barrier, speaks Nepali only 12/01/2012  . Supervision of normal first pregnancy 10/26/2012  . Idiopathic scoliosis 09/25/2012    To Do: 1.   [ ]  Vaccines: rec [ x] BCM:  mirena [ ]  Readiness: baby has a place to sleep, car seat, other baby necessities.  Edu: [ x] PTL precautions; [ ]  BF class; [ ]  childbirth class; [ ]   BF counseling;  Interpreter phone used

## 2013-06-10 NOTE — Patient Instructions (Signed)
Third Trimester of Pregnancy  The third trimester is from week 29 through week 42, months 7 through 9. The third trimester is a time when the fetus is growing rapidly. At the end of the ninth month, the fetus is about 20 inches in length and weighs 6 10 pounds.   BODY CHANGES  Your body goes through many changes during pregnancy. The changes vary from woman to woman.    Your weight will continue to increase. You can expect to gain 25 35 pounds (11 16 kg) by the end of the pregnancy.   You may begin to get stretch marks on your hips, abdomen, and breasts.   You may urinate more often because the fetus is moving lower into your pelvis and pressing on your bladder.   You may develop or continue to have heartburn as a result of your pregnancy.   You may develop constipation because certain hormones are causing the muscles that push waste through your intestines to slow down.   You may develop hemorrhoids or swollen, bulging veins (varicose veins).   You may have pelvic pain because of the weight gain and pregnancy hormones relaxing your joints between the bones in your pelvis. Back aches may result from over exertion of the muscles supporting your posture.   Your breasts will continue to grow and be tender. A yellow discharge may leak from your breasts called colostrum.   Your belly button may stick out.   You may feel short of breath because of your expanding uterus.   You may notice the fetus "dropping," or moving lower in your abdomen.   You may have a bloody mucus discharge. This usually occurs a few days to a week before labor begins.   Your cervix becomes thin and soft (effaced) near your due date.  WHAT TO EXPECT AT YOUR PRENATAL EXAMS   You will have prenatal exams every 2 weeks until week 36. Then, you will have weekly prenatal exams. During a routine prenatal visit:   You will be weighed to make sure you and the fetus are growing normally.   Your blood pressure is taken.   Your abdomen will be  measured to track your baby's growth.   The fetal heartbeat will be listened to.   Any test results from the previous visit will be discussed.   You may have a cervical check near your due date to see if you have effaced.  At around 36 weeks, your caregiver will check your cervix. At the same time, your caregiver will also perform a test on the secretions of the vaginal tissue. This test is to determine if a type of bacteria, Group B streptococcus, is present. Your caregiver will explain this further.  Your caregiver may ask you:   What your birth plan is.   How you are feeling.   If you are feeling the baby move.   If you have had any abnormal symptoms, such as leaking fluid, bleeding, severe headaches, or abdominal cramping.   If you have any questions.  Other tests or screenings that may be performed during your third trimester include:   Blood tests that check for low iron levels (anemia).   Fetal testing to check the health, activity level, and growth of the fetus. Testing is done if you have certain medical conditions or if there are problems during the pregnancy.  FALSE LABOR  You may feel small, irregular contractions that eventually go away. These are called Braxton Hicks contractions, or   false labor. Contractions may last for hours, days, or even weeks before true labor sets in. If contractions come at regular intervals, intensify, or become painful, it is best to be seen by your caregiver.   SIGNS OF LABOR    Menstrual-like cramps.   Contractions that are 5 minutes apart or less.   Contractions that start on the top of the uterus and spread down to the lower abdomen and back.   A sense of increased pelvic pressure or back pain.   A watery or bloody mucus discharge that comes from the vagina.  If you have any of these signs before the 37th week of pregnancy, call your caregiver right away. You need to go to the hospital to get checked immediately.  HOME CARE INSTRUCTIONS    Avoid all  smoking, herbs, alcohol, and unprescribed drugs. These chemicals affect the formation and growth of the baby.   Follow your caregiver's instructions regarding medicine use. There are medicines that are either safe or unsafe to take during pregnancy.   Exercise only as directed by your caregiver. Experiencing uterine cramps is a good sign to stop exercising.   Continue to eat regular, healthy meals.   Wear a good support bra for breast tenderness.   Do not use hot tubs, steam rooms, or saunas.   Wear your seat belt at all times when driving.   Avoid raw meat, uncooked cheese, cat litter boxes, and soil used by cats. These carry germs that can cause birth defects in the baby.   Take your prenatal vitamins.   Try taking a stool softener (if your caregiver approves) if you develop constipation. Eat more high-fiber foods, such as fresh vegetables or fruit and whole grains. Drink plenty of fluids to keep your urine clear or pale yellow.   Take warm sitz baths to soothe any pain or discomfort caused by hemorrhoids. Use hemorrhoid cream if your caregiver approves.   If you develop varicose veins, wear support hose. Elevate your feet for 15 minutes, 3 4 times a day. Limit salt in your diet.   Avoid heavy lifting, wear low heal shoes, and practice good posture.   Rest a lot with your legs elevated if you have leg cramps or low back pain.   Visit your dentist if you have not gone during your pregnancy. Use a soft toothbrush to brush your teeth and be gentle when you floss.   A sexual relationship may be continued unless your caregiver directs you otherwise.   Do not travel far distances unless it is absolutely necessary and only with the approval of your caregiver.   Take prenatal classes to understand, practice, and ask questions about the labor and delivery.   Make a trial run to the hospital.   Pack your hospital bag.   Prepare the baby's nursery.   Continue to go to all your prenatal visits as directed  by your caregiver.  SEEK MEDICAL CARE IF:   You are unsure if you are in labor or if your water has broken.   You have dizziness.   You have mild pelvic cramps, pelvic pressure, or nagging pain in your abdominal area.   You have persistent nausea, vomiting, or diarrhea.   You have a bad smelling vaginal discharge.   You have pain with urination.  SEEK IMMEDIATE MEDICAL CARE IF:    You have a fever.   You are leaking fluid from your vagina.   You have spotting or bleeding from your vagina.     You have severe abdominal cramping or pain.   You have rapid weight loss or gain.   You have shortness of breath with chest pain.   You notice sudden or extreme swelling of your face, hands, ankles, feet, or legs.   You have not felt your baby move in over an hour.   You have severe headaches that do not go away with medicine.   You have vision changes.  Document Released: 04/02/2001 Document Revised: 12/09/2012 Document Reviewed: 06/09/2012  [employer] Patient Information 2014 [employer].

## 2013-06-11 ENCOUNTER — Inpatient Hospital Stay (HOSPITAL_COMMUNITY)
Admission: AD | Admit: 2013-06-11 | Discharge: 2013-06-12 | Disposition: A | Discharge status: Home / Self Care | Payer: Medicaid Other | Source: Ambulatory Visit | Attending: Obstetrics & Gynecology | Admitting: Obstetrics & Gynecology

## 2013-06-11 ENCOUNTER — Encounter (HOSPITAL_COMMUNITY): Payer: Self-pay

## 2013-06-11 DIAGNOSIS — O9989 Other specified diseases and conditions complicating pregnancy, childbirth and the puerperium: Principal | ICD-10-CM

## 2013-06-11 DIAGNOSIS — N898 Other specified noninflammatory disorders of vagina: Secondary | ICD-10-CM | POA: Insufficient documentation

## 2013-06-11 DIAGNOSIS — O479 False labor, unspecified: Secondary | ICD-10-CM | POA: Insufficient documentation

## 2013-06-11 DIAGNOSIS — O99891 Other specified diseases and conditions complicating pregnancy: Secondary | ICD-10-CM | POA: Insufficient documentation

## 2013-06-11 LAB — WET PREP, GENITAL
Clue Cells Wet Prep HPF POC: NONE SEEN
Trich, Wet Prep: NONE SEEN
Yeast Wet Prep HPF POC: NONE SEEN

## 2013-06-11 NOTE — MAU Provider Note (Signed)
  History     CSN: [id number]  Arrival date and time: 06/11/13 2106   None     Chief Complaint  Patient presents with  . Rupture of Membranes  . Contractions   HPI Ms Donell BeersSanyasi is a 23yo G1 @ 39.2wks who presents for eval of possible leaking fluid since 8am. She reports changing underwear multiple times but did not place a sanitary pad. Reports irreg, painful ctx. Having +FM. No bldg. Her [facility] and has been essentially unremarkable.  OB History   Grav Para Term Preterm Abortions TAB SAB Ect Mult Living   1               Past Medical History  Diagnosis Date  . Medical history non-contributory     Past Surgical History  Procedure Laterality Date  . No past surgeries      Family History  Problem Relation Age of Onset  . Hearing loss Neg Hx     History  Substance Use Topics  . Smoking status: Never Smoker   . Smokeless tobacco: Never Used  . Alcohol Use: No    Allergies: No Known Allergies  Prescriptions prior to admission  Medication Sig Dispense Refill  . Prenatal Vit-Fe Fumarate-FA (PRENATAL MULTIVITAMIN) TABS tablet Take 1 tablet by mouth daily at 12 noon.        ROS Physical Exam   Blood pressure 92/50, pulse 105, temperature 97.8 F (36.6 C), resp. rate 18, height 4\' 11"  (1.499 m), weight 55.248 kg (121 lb 12.8 oz), last menstrual period 09/09/2012.  Physical Exam  Constitutional: She is oriented to person, place, and time. She appears well-developed.  HENT:  Head: Normocephalic.  Neck: Normal range of motion.  Cardiovascular:  Sl tachycardic  Respiratory: Effort normal.  GI:  EFM baseline 135 +accels, no decels, occ mi variables Irreg ctx  Genitourinary:  SSE: neg pool, sm-mod white/yellow d/c; Cx C/50  Musculoskeletal: Normal range of motion.  Neurological: She is alert and oriented to person, place, and time.  Skin: Skin is warm and dry.  Psychiatric: She has a normal mood and affect. Her behavior is  normal. Thought content normal.   Wet prep: mod WBC, mod bacteria  MAU Course  Procedures    Assessment and Plan  IUP at 39.2wks Vaginal discharge  D/C home with labor/ROM/bldg precautions F/U as scheduled on 2/26 at next visit  Cam HaiSHAW, KIMBERLY 06/11/2013, 11:29 PM

## 2013-06-11 NOTE — MAU Note (Signed)
Leaking a lot of white fld since this morning. Contractions

## 2013-06-11 NOTE — Discharge Instructions (Signed)

## 2013-06-17 ENCOUNTER — Ambulatory Visit (INDEPENDENT_AMBULATORY_CARE_PROVIDER_SITE_OTHER): Payer: Medicaid Other | Admitting: Obstetrics & Gynecology

## 2013-06-17 VITALS — BP 125/83 | Temp 97.2°F | Wt 121.1 lb

## 2013-06-17 DIAGNOSIS — Z789 Other specified health status: Secondary | ICD-10-CM

## 2013-06-17 DIAGNOSIS — O480 Post-term pregnancy: Secondary | ICD-10-CM

## 2013-06-17 DIAGNOSIS — Z609 Problem related to social environment, unspecified: Secondary | ICD-10-CM

## 2013-06-17 DIAGNOSIS — Z3400 Encounter for supervision of normal first pregnancy, unspecified trimester: Secondary | ICD-10-CM

## 2013-06-17 LAB — POCT URINALYSIS DIP (DEVICE)
Bilirubin Urine: NEGATIVE
Glucose, UA: NEGATIVE mg/dL
Ketones, ur: NEGATIVE mg/dL
NITRITE: NEGATIVE
PH: 7 (ref 5.0–8.0)
Protein, ur: NEGATIVE mg/dL
Specific Gravity, Urine: 1.015 (ref 1.005–1.030)
UROBILINOGEN UA: 0.2 mg/dL (ref 0.0–1.0)

## 2013-06-17 NOTE — Progress Notes (Signed)
[employer] interpreter used.  Postdates NST today was reactive, will return later next week for NST and AFI if still pregnant.  Cervix 1/70/-2/soft/vertex. No other complaints or concerns.  Fetal movement and labor precautions reviewed.

## 2013-06-17 NOTE — Patient Instructions (Signed)
Return to clinic for any obstetric concerns or go to [facility] for evaluation  

## 2013-06-17 NOTE — Progress Notes (Signed)
P= 125. Used [employer]. C/o leaking cloudy thick discharge , c/o pelvic pain and contractions since yesterday that occur every 4-5 minutes.

## 2013-06-21 ENCOUNTER — Ambulatory Visit (INDEPENDENT_AMBULATORY_CARE_PROVIDER_SITE_OTHER): Payer: Medicaid Other | Admitting: *Deleted

## 2013-06-21 VITALS — BP 112/76

## 2013-06-21 DIAGNOSIS — O480 Post-term pregnancy: Secondary | ICD-10-CM

## 2013-06-21 NOTE — Progress Notes (Signed)
P = 115  [employer] Interpreter # [phone] used to inform pt of test results and explain IOL could be scheduled if desired @ 41 wks.  Pt confirmed desire for IOL appt. IOL scheduled on 3/4 @ 1930

## 2013-06-21 NOTE — Progress Notes (Signed)
NST reviewed and reactive.  Kelsey Davies, M.D., FACOG    

## 2013-06-22 ENCOUNTER — Telehealth (HOSPITAL_COMMUNITY): Payer: Self-pay | Admitting: *Deleted

## 2013-06-22 NOTE — Telephone Encounter (Signed)
Preadmission screen Interpreter number [phone]

## 2013-06-23 ENCOUNTER — Encounter (HOSPITAL_COMMUNITY): Payer: Medicaid Other | Admitting: Anesthesiology

## 2013-06-23 ENCOUNTER — Inpatient Hospital Stay (HOSPITAL_COMMUNITY)
Admission: AD | Admit: 2013-06-23 | Discharge: 2013-06-24 | DRG: 775 | Disposition: A | Discharge status: Home / Self Care | Payer: Medicaid Other | Source: Ambulatory Visit | Attending: Obstetrics and Gynecology | Admitting: Obstetrics and Gynecology

## 2013-06-23 ENCOUNTER — Encounter (HOSPITAL_COMMUNITY): Payer: Self-pay | Admitting: *Deleted

## 2013-06-23 ENCOUNTER — Inpatient Hospital Stay (HOSPITAL_COMMUNITY): Admission: RE | Admit: 2013-06-23 | Payer: Medicaid Other | Source: Ambulatory Visit

## 2013-06-23 ENCOUNTER — Inpatient Hospital Stay (HOSPITAL_COMMUNITY): Payer: Medicaid Other | Admitting: Anesthesiology

## 2013-06-23 DIAGNOSIS — Z3400 Encounter for supervision of normal first pregnancy, unspecified trimester: Secondary | ICD-10-CM

## 2013-06-23 DIAGNOSIS — IMO0001 Reserved for inherently not codable concepts without codable children: Secondary | ICD-10-CM

## 2013-06-23 LAB — CBC
HCT: 41 % (ref 36.0–46.0)
HEMOGLOBIN: 13.9 g/dL (ref 12.0–15.0)
MCH: 28.7 pg (ref 26.0–34.0)
MCHC: 33.9 g/dL (ref 30.0–36.0)
MCV: 84.5 fL (ref 78.0–100.0)
Platelets: 323 10*3/uL (ref 150–400)
RBC: 4.85 MIL/uL (ref 3.87–5.11)
RDW: 14 % (ref 11.5–15.5)
WBC: 17.3 10*3/uL — AB (ref 4.0–10.5)

## 2013-06-23 LAB — TYPE AND SCREEN
ABO/RH(D): A POS
ANTIBODY SCREEN: NEGATIVE

## 2013-06-23 LAB — ABO/RH: ABO/RH(D): A POS

## 2013-06-23 LAB — RPR: RPR: NONREACTIVE

## 2013-06-23 MED ORDER — DIPHENHYDRAMINE HCL 50 MG/ML IJ SOLN: 12.5000 mg | INTRAMUSCULAR | Status: DC | PRN

## 2013-06-23 MED ORDER — PRENATAL MULTIVITAMIN CH
1.0000 | ORAL_TABLET | Freq: Every day | ORAL | Status: DC
  Administered 2013-06-24: 1 via ORAL
  Filled 2013-06-23: qty 1

## 2013-06-23 MED ORDER — ONDANSETRON HCL 4 MG/2ML IJ SOLN: 4.0000 mg | INTRAMUSCULAR | Status: DC | PRN

## 2013-06-23 MED ORDER — LIDOCAINE HCL (PF) 1 % IJ SOLN
INTRAMUSCULAR | Status: DC | PRN
  Administered 2013-06-23 (×2): 8 mL

## 2013-06-23 MED ORDER — FENTANYL 2.5 MCG/ML BUPIVACAINE 1/10 % EPIDURAL INFUSION (WH - ANES)
14.0000 mL/h | INTRAMUSCULAR | Status: DC | PRN
  Filled 2013-06-23: qty 125

## 2013-06-23 MED ORDER — ONDANSETRON HCL 4 MG PO TABS: 4.0000 mg | ORAL_TABLET | ORAL | Status: DC | PRN

## 2013-06-23 MED ORDER — SIMETHICONE 80 MG PO CHEW: 80.0000 mg | CHEWABLE_TABLET | ORAL | Status: DC | PRN

## 2013-06-23 MED ORDER — ONDANSETRON HCL 4 MG/2ML IJ SOLN: 4.0000 mg | Freq: Four times a day (QID) | INTRAMUSCULAR | Status: DC | PRN

## 2013-06-23 MED ORDER — DIBUCAINE 1 % RE OINT: 1.0000 "application " | TOPICAL_OINTMENT | RECTAL | Status: DC | PRN

## 2013-06-23 MED ORDER — PHENYLEPHRINE 40 MCG/ML (10ML) SYRINGE FOR IV PUSH (FOR BLOOD PRESSURE SUPPORT)
80.0000 ug | PREFILLED_SYRINGE | INTRAVENOUS | Status: DC | PRN
  Administered 2013-06-23 (×2): 80 ug via INTRAVENOUS
  Filled 2013-06-23: qty 2

## 2013-06-23 MED ORDER — LIDOCAINE HCL (PF) 1 % IJ SOLN
30.0000 mL | INTRAMUSCULAR | Status: DC | PRN
  Administered 2013-06-23: 30 mL via SUBCUTANEOUS
  Filled 2013-06-23: qty 30

## 2013-06-23 MED ORDER — IBUPROFEN 600 MG PO TABS
600.0000 mg | ORAL_TABLET | Freq: Four times a day (QID) | ORAL | Status: DC | PRN
  Administered 2013-06-23: 600 mg via ORAL
  Filled 2013-06-23: qty 1

## 2013-06-23 MED ORDER — OXYTOCIN 40 UNITS IN LACTATED RINGERS INFUSION - SIMPLE MED
62.5000 mL/h | INTRAVENOUS | Status: DC
  Filled 2013-06-23: qty 1000

## 2013-06-23 MED ORDER — SENNOSIDES-DOCUSATE SODIUM 8.6-50 MG PO TABS
2.0000 | ORAL_TABLET | ORAL | Status: DC
  Administered 2013-06-24: 2 via ORAL
  Filled 2013-06-23: qty 2

## 2013-06-23 MED ORDER — CITRIC ACID-SODIUM CITRATE 334-500 MG/5ML PO SOLN
30.0000 mL | ORAL | Status: DC | PRN
  Filled 2013-06-23: qty 15

## 2013-06-23 MED ORDER — LACTATED RINGERS IV SOLN
INTRAVENOUS | Status: DC
  Administered 2013-06-23: 07:00:00 via INTRAVENOUS

## 2013-06-23 MED ORDER — EPHEDRINE 5 MG/ML INJ
10.0000 mg | INTRAVENOUS | Status: DC | PRN
  Filled 2013-06-23: qty 2

## 2013-06-23 MED ORDER — LANOLIN HYDROUS EX OINT
TOPICAL_OINTMENT | CUTANEOUS | Status: DC | PRN
Start: 2013-06-23 — End: 2013-06-24

## 2013-06-23 MED ORDER — TERBUTALINE SULFATE 1 MG/ML IJ SOLN
INTRAMUSCULAR | Status: AC
  Administered 2013-06-23: 0.25 mg
  Filled 2013-06-23: qty 1

## 2013-06-23 MED ORDER — WITCH HAZEL-GLYCERIN EX PADS: 1.0000 "application " | MEDICATED_PAD | CUTANEOUS | Status: DC | PRN

## 2013-06-23 MED ORDER — BENZOCAINE-MENTHOL 20-0.5 % EX AERO: 1.0000 "application " | INHALATION_SPRAY | CUTANEOUS | Status: DC | PRN

## 2013-06-23 MED ORDER — EPHEDRINE 5 MG/ML INJ
10.0000 mg | INTRAVENOUS | Status: DC | PRN
  Filled 2013-06-23: qty 2
  Filled 2013-06-23: qty 4

## 2013-06-23 MED ORDER — LACTATED RINGERS IV SOLN: 500.0000 mL | INTRAVENOUS | Status: DC | PRN

## 2013-06-23 MED ORDER — OXYCODONE-ACETAMINOPHEN 5-325 MG PO TABS: 1.0000 | ORAL_TABLET | ORAL | Status: DC | PRN

## 2013-06-23 MED ORDER — LACTATED RINGERS IV SOLN
500.0000 mL | Freq: Once | INTRAVENOUS | Status: AC
  Administered 2013-06-23: 500 mL via INTRAVENOUS

## 2013-06-23 MED ORDER — ZOLPIDEM TARTRATE 5 MG PO TABS: 5.0000 mg | ORAL_TABLET | Freq: Every evening | ORAL | Status: DC | PRN

## 2013-06-23 MED ORDER — OXYCODONE-ACETAMINOPHEN 5-325 MG PO TABS
1.0000 | ORAL_TABLET | ORAL | Status: DC | PRN
  Administered 2013-06-23: 1 via ORAL
  Filled 2013-06-23: qty 1

## 2013-06-23 MED ORDER — DIPHENHYDRAMINE HCL 25 MG PO CAPS: 25.0000 mg | ORAL_CAPSULE | Freq: Four times a day (QID) | ORAL | Status: DC | PRN

## 2013-06-23 MED ORDER — IBUPROFEN 600 MG PO TABS
600.0000 mg | ORAL_TABLET | Freq: Four times a day (QID) | ORAL | Status: DC
  Administered 2013-06-24 (×3): 600 mg via ORAL
  Filled 2013-06-23 (×3): qty 1

## 2013-06-23 MED ORDER — TERBUTALINE SULFATE 1 MG/ML IJ SOLN: 0.2500 mg | Freq: Once | INTRAMUSCULAR | Status: AC | PRN

## 2013-06-23 MED ORDER — ACETAMINOPHEN 325 MG PO TABS: 650.0000 mg | ORAL_TABLET | ORAL | Status: DC | PRN

## 2013-06-23 MED ORDER — TETANUS-DIPHTH-ACELL PERTUSSIS 5-2.5-18.5 LF-MCG/0.5 IM SUSP: 0.5000 mL | Freq: Once | INTRAMUSCULAR | Status: DC

## 2013-06-23 MED ORDER — OXYTOCIN BOLUS FROM INFUSION: 500.0000 mL | INTRAVENOUS | Status: DC

## 2013-06-23 MED ORDER — OXYTOCIN 40 UNITS IN LACTATED RINGERS INFUSION - SIMPLE MED
1.0000 m[IU]/min | INTRAVENOUS | Status: DC
  Administered 2013-06-23: 666 m[IU]/min via INTRAVENOUS
  Administered 2013-06-23: 2 m[IU]/min via INTRAVENOUS

## 2013-06-23 MED ORDER — FENTANYL 2.5 MCG/ML BUPIVACAINE 1/10 % EPIDURAL INFUSION (WH - ANES)
INTRAMUSCULAR | Status: DC | PRN
  Administered 2013-06-23: 14 mL/h via EPIDURAL

## 2013-06-23 MED ORDER — PHENYLEPHRINE 40 MCG/ML (10ML) SYRINGE FOR IV PUSH (FOR BLOOD PRESSURE SUPPORT)
80.0000 ug | PREFILLED_SYRINGE | INTRAVENOUS | Status: DC | PRN
  Administered 2013-06-23: 80 ug via INTRAVENOUS
  Filled 2013-06-23: qty 2
  Filled 2013-06-23: qty 10

## 2013-06-23 MED ORDER — TERBUTALINE SULFATE 1 MG/ML IJ SOLN: 0.2500 mg | Freq: Once | INTRAMUSCULAR | Status: DC

## 2013-06-23 NOTE — Anesthesia Postprocedure Evaluation (Signed)
Anesthesia Post Note  Patient: Kelsey Davies  Procedure(s) Performed: * No procedures listed *  Anesthesia type: Epidural  Patient location: Mother/Baby  Post pain: Pain level controlled  Post assessment: Post-op Vital signs reviewed  Last Vitals:  Filed Vitals:   06/23/13 1045  BP: 99/60  Pulse: 108  Temp: 36.7 C  Resp: 20    Post vital signs: Reviewed  Level of consciousness:alert  Complications: No apparent anesthesia complications

## 2013-06-23 NOTE — H&P (Signed)
Kelsey Davies is a 23 y.o. female G1P0 with IUP at 4058w0d presenting for active labor. Pt states she has been having regular, every 3-5 minutes contractions, associated with spotting vaginal bleeding.  Membranes are intact, with active fetal movement.    [facility]  Prenatal History/Complications: none  Past Medical History: Past Medical History  Diagnosis Date  . Medical history non-contributory     Past Surgical History: Past Surgical History  Procedure Laterality Date  . No past surgeries      Obstetrical History: OB History   Grav Para Term Preterm Abortions TAB SAB Ect Mult Living   1              Social History: History   Social History  . Marital Status: Significant Other    Spouse Name: N/A    Number of Children: N/A  . Years of Education: N/A   Social History Main Topics  . Smoking status: Never Smoker   . Smokeless tobacco: Never Used  . Alcohol Use: No  . Drug Use: No  . Sexual Activity: Yes    Birth Control/ Protection: None   Other Topics Concern  . None   Social History Narrative  . None    Family History: Family History  Problem Relation Age of Onset  . Hearing loss Neg Hx     Allergies: No Known Allergies  Prescriptions prior to admission  Medication Sig Dispense Refill  . Prenatal Vit-Fe Fumarate-FA (PRENATAL MULTIVITAMIN) TABS tablet Take 1 tablet by mouth daily at 12 noon.        Review of Systems: Negative unless otherwise stated in History above  Physicial Blood pressure 116/75, pulse 126, temperature 98.5 F (36.9 C), temperature source Oral, resp. rate 18, last menstrual period 09/09/2012. General appearance: alert, cooperative and no distress Lungs: clear to auscultation bilaterally Heart: regular rate and rhythm Abdomen: soft, gravid, non-tender; bowel sounds normal Extremities: Homans sign is negative, no sign of DVT DTR's 2+ Presentation: cephalic Fetal monitoringBaseline: 140 bpm Uterine  activityFrequency: Every 3-5 minutes Dilation: 3 Effacement (%): 90 Exam by:: B.Bethea RN  Prenatal labs: ABO, Rh: A/POS/-- (08/12 0907) Antibody: NEG (08/12 0907) Rubella:   RPR: NON REAC (12/02 1533)  HBsAg: NEGATIVE (08/12 0907)  HIV: NON REACTIVE (12/02 1533)  GBS: Negative (01/29 0000)  GTT: 1 hr 84   [facility]  Dating LMP/Ultrasound: 13  weeks        Ultrasound consistent with LMP: Yes  Genetic Screen 1 Screen:   Normal              AFP:   neg                  Anatomic US  WNL  GTT Third trimester: 84  TDaP vaccine 03/23/13  Flu vaccine  11/4  GBS Negative  Baby Food  breast/bottle  Contraception  mirena  Circumcision  declines  Support Person Sister  Pediatrician [facility]    Prenatal Transfer Tool  Maternal Diabetes: No Genetic Screening: Normal Maternal Ultrasounds/Referrals: Normal Fetal Ultrasounds or other Referrals:  None Maternal Substance Abuse:  No Significant Maternal Medications:  None Significant Maternal Lab Results: Lab values include: Group B Strep negative  No results found for this or any previous visit (from the past 24 hour(s)).  Assessment: Kelsey Davies is a 23 y.o. G1P0 at 4358w0d by here for active labor  #Labor: Progressing normally, expectant management, if no change in 4 hrs  will augment with pitocin given favorable cervix. #Pain: Labor support, no medications needed at this time #FWB: Cat 1 #ID:  GBS negative #Feeding: breast/bottle #MOC:Mirena #Circ:  Declines  Wenda Low MD [facility] FM PGY-1 06/23/2013, 1:35 AM   I spoke with and examined patient and agree with resident's note and plan of care.  Tawana Scale, MD OB Fellow 06/23/2013 4:15 AM

## 2013-06-23 NOTE — Progress Notes (Signed)
Interpreter#[id number] via [employer]

## 2013-06-23 NOTE — H&P (Signed)
Attestation of Attending Supervision of Advanced Practitioner (CNM/NP): Evaluation and management procedures were performed by the Advanced Practitioner under my supervision and collaboration.  I have reviewed the Advanced Practitioner's note and chart, and I agree with the management and plan.  Doyce Saling 06/23/2013 8:06 AM

## 2013-06-23 NOTE — MAU Note (Signed)
Pt states contractions started about 1800 today.pt states she goes to the clinic downstairs

## 2013-06-23 NOTE — Progress Notes (Signed)
UR chart review completed.  

## 2013-06-23 NOTE — Anesthesia Procedure Notes (Signed)
Epidural Patient location during procedure: OB Start time: 06/23/2013 5:06 AM End time: 06/23/2013 5:09 AM  Staffing Anesthesiologist: Leilani AbleHATCHETT, Jatavis Malek Performed by: anesthesiologist   Preanesthetic Checklist Completed: patient identified, site marked, surgical consent, pre-op evaluation, timeout performed, IV checked, risks and benefits discussed and monitors and equipment checked  Epidural Patient position: sitting Prep: site prepped and draped and DuraPrep Patient monitoring: continuous pulse ox and blood pressure Approach: midline Location: L3-L4 Injection technique: LOR air  Needle:  Needle type: Tuohy  Needle gauge: 17 G Needle length: 9 cm and 9 Needle insertion depth: 4 cm Catheter type: closed end flexible Catheter size: 19 Gauge Catheter at skin depth: 8 cm Test dose: negative and Other  Assessment Sensory level: T9 Events: blood not aspirated, injection not painful, no injection resistance, negative IV test and no paresthesia

## 2013-06-23 NOTE — Progress Notes (Signed)
Kelsey JabsMadhura Davies is a [age] y.o. G1P0 at 6368w0d admitted for active labor  Subjective: Contraction pain is increasing, and she request epidural   Objective: BP 117/73  Pulse 104  Temp(Src) 98.4 F (36.9 C) (Oral)  Resp 18  Ht 4\' 11"  (1.499 m)  Wt 54.885 kg (121 lb)  BMI 24.43 kg/m2  LMP 09/09/2012    FHT:  FHR: Difficult to capture; 140 bpm, variability: moderate,  accelerations:  Abscent,  decelerations:  Absent UC:   irregular SVE:   Dilation: 5 Effacement (%): 80 Station: -2 Exam by:: Dr. Ike Benedom  Labs: Lab Results  Component Value Date   WBC 17.3* 06/23/2013   HGB 13.9 06/23/2013   HCT 41.0 06/23/2013   MCV 84.5 06/23/2013   PLT 323 06/23/2013   Assessment / Plan: Spontaneous labor, progressing normally  Labor: Progressing normally and requesting epidural Fetal Wellbeing:  Difficult to capture: Appears Cat 1 Pain Control:  Epidural I/D:  GBS neg Anticipated MOD:  NSVD  Wenda LowJoyner, Charlaine Utsey 06/23/2013, 4:43 AM

## 2013-06-23 NOTE — Anesthesia Preprocedure Evaluation (Signed)
Anesthesia Evaluation  Patient identified by MRN, date of birth, ID band Patient awake    Reviewed: Allergy & Precautions, H&P , NPO status , Patient's Chart, lab work & pertinent test results  Airway Mallampati: I  TM Distance: >3 FB Neck ROM: full    Dental no notable dental hx.    Pulmonary neg pulmonary ROS,    Pulmonary exam normal       Cardiovascular negative cardio ROS      Neuro/Psych negative neurological ROS  negative psych ROS   GI/Hepatic negative GI ROS, Neg liver ROS,   Endo/Other  negative endocrine ROS  Renal/GU negative Renal ROS     Musculoskeletal   Abdominal Normal abdominal exam  (+)   Peds  Hematology negative hematology ROS (+)   Anesthesia Other Findings   Reproductive/Obstetrics (+) Pregnancy                             Anesthesia Physical Anesthesia Plan  ASA: II  Anesthesia Plan: Epidural   Post-op Pain Management:    Induction:   Airway Management Planned:   Additional Equipment:   Intra-op Plan:   Post-operative Plan:   Informed Consent: I have reviewed the patients History and Physical, chart, labs and discussed the procedure including the risks, benefits and alternatives for the proposed anesthesia with the patient or authorized representative who has indicated his/her understanding and acceptance.     Plan Discussed with:   Anesthesia Plan Comments:         Anesthesia Quick Evaluation  

## 2013-06-23 NOTE — Lactation Note (Signed)
This note was copied from the chart of Kelsey Claybon JabsMadhura Macapagal. Lactation Consultation Note Initial consult:  [employer] Interpreter [phone].  Taught mother how to hand express and mother has good flow of colostrum.  Reviewed basics, STS, cluster feeding, hand pump use, lactation brochure.  Assisted mother to place baby in cross cradle hold, baby sleepy would not sustain latch.  Assisted in placing baby STS on mother's chest.   Patient Name: Kelsey Davies WGNFA'OToday's Date: 06/23/2013 Reason for consult: Initial assessment   Maternal Data Infant to breast within first hour of birth: Yes Has patient been taught Hand Expression?: Yes Does the patient have breastfeeding experience prior to this delivery?: No  Feeding    LATCH Score/Interventions                      Lactation Tools Discussed/Used     Consult Status      [name], [name] 06/23/2013, 2:39 PM

## 2013-06-24 LAB — CBC
HCT: 35.7 % — ABNORMAL LOW (ref 36.0–46.0)
HEMOGLOBIN: 11.7 g/dL — AB (ref 12.0–15.0)
MCH: 28 pg (ref 26.0–34.0)
MCHC: 32.8 g/dL (ref 30.0–36.0)
MCV: 85.4 fL (ref 78.0–100.0)
Platelets: 270 10*3/uL (ref 150–400)
RBC: 4.18 MIL/uL (ref 3.87–5.11)
RDW: 14.2 % (ref 11.5–15.5)
WBC: 17.7 10*3/uL — AB (ref 4.0–10.5)

## 2013-06-24 MED ORDER — IBUPROFEN 600 MG PO TABS: 600.0000 mg | ORAL_TABLET | Freq: Four times a day (QID) | ORAL | Status: DC | PRN

## 2013-06-24 NOTE — Lactation Note (Signed)
This note was copied from the chart of Boy Claybon JabsMadhura Pewitt. Lactation Consultation Note  Patient Name: Boy Claybon JabsMadhura Bagdasarian ZOXWR'UToday's Date: 06/24/2013 Reason for consult: Follow-up assessment;Breast/nipple pain;Infant < 6lbs;Other (Comment) (Language Resources M. Khatiwada present) C/O soreness to nipples, no bruising noted. Comfort gels given. Demonstrated deep latch. Noted everted nipple, noted when nipple compressed sunk in d/t edema. Reverse pressure demonstrated as well as shells given. Positioned w/football hold, teaching family member to ease chin down until baby opening wider. Reviewed sore nipple, engorgement and Tx. Storage guidelines of pumped colostrum. Importance of keeping track of voids and stools. Lactation O/P services and consult services discussed. Significant other in room. For information as well.  Maternal Data    Feeding Feeding Type: Breast Fed Length of feed: 15 min  LATCH Score/Interventions Latch: Repeated attempts needed to sustain latch, nipple held in mouth throughout feeding, stimulation needed to elicit sucking reflex. Intervention(s): Adjust position;Assist with latch;Breast massage;Breast compression  Audible Swallowing: Spontaneous and intermittent Intervention(s): Skin to skin;Hand expression;Alternate breast massage  Type of Nipple: Everted at rest and after stimulation  Comfort (Breast/Nipple): Filling, red/small blisters or bruises, mild/mod discomfort  Problem noted: Mild/Moderate discomfort Interventions (Mild/moderate discomfort): Hand massage;Hand expression;Reverse pressue;Pre-pump if needed;Comfort gels  Hold (Positioning): Assistance needed to correctly position infant at breast and maintain latch. Intervention(s): Breastfeeding basics reviewed;Support Pillows;Position options;Skin to skin  LATCH Score: 7  Lactation Tools Discussed/Used Tools: Comfort gels;Shells;Pump Shell Type: Inverted Breast pump type: Manual   Consult  Status Consult Status: Complete Date: 06/24/13 Follow-up type: In-patient    Asa Fath, Diamond NickelLAURA G 06/24/2013, 1:10 PM

## 2013-06-24 NOTE — Discharge Instructions (Signed)
???? ????? ??? ????? ???? °????? ????? ????? (  postpartum ????) ??????? ???, ????????? ??????? ?????? 24 72 ????? ?? ????? ???? ?? ????? ????????? ??? ??? ????? ???? ???????? ?????? ? ???, ??, ????? ?? ??????? ?? ??? ????? ????? ??????? ?? ??, ????? postpartum ?????? ??? ? ????? ????? ?????? ???? ??? ? ???????? ??????? ?????? ????? ??????? ?? ??: ????? ????? ?? ??????? ? ??? ????? ????? ? ?? ??? ???? ???? ?????? ????? ???? ????, ????? ???? ??????? ????? ?? ???? ????? ???? (episiotomy) ???? ??? ?? ????? ????? ????? ???? ????????? ???? ???, ????????? ????? episiotomy ?? ???? ?? ??? ??? ??????? ???? ???? ??? ??? ???? ? ????????? ?? ???? ??? ???? ????? ????? ??????? ??? ???, ????? ??? ??????? ???? ????? ????????? ???? ?????? ????? ??? ????? ?? ??????? ?? ?? ?????? ????? ????????? ?????? ??????? ???? ??????? ???? ?? ????? ??? ???? ????????? ???? ????????? ??? ????? ??? ????? 1 3 ??? ?? ????, ?????? ???? ? ? ???? ???? ?? ???? ??? ????? ?????? ???? ? ???? ???? ?? ???? ?? ??????? ?? ????? ???? ???, ????? ???? ???? ????? ????? ????? ????? ???? ????? ?? ????? ?????? ???? ????? ???? ????????? ??? ????? ???? ??? ????????? ?????????? ???? ??? ???? ??? ?????? ?? ??? ???? ??? ????? ??????????? ????? ??? ????? 3 10 ?????, ????? ?????? ??? ???? ? ?????? ?? ????-tinged ??? ?? ???? ?????????? ?? ??? ??? ????? ???, ????? ?????? ????? ???-???? ?????? ?? ?? ???? ???? ????????? ????? ?????? ?? ???? ????? ??? ????? ???? ????? ?? ?? ?????? ??????? ?????? 6 8 ??????? ????? ????? ???????? ???????? ????? ??? 12 ????? ????? ?????? ????? ????? ???? ????? ???????? ????? ???????? ??? ???????? ?????????? , ?? ???? ?? ????? ????? ??????? ??? ?????? ??????, ??? ???????? ??? ??????? 20 ??????? ?? ???? ????? ? ???? ??????? ??????? ???? ????? ????? ??? ????? 6 8 ????? ????? ????? ???????? ???? ???? ???????? ????? ????? ????? ?????????? ?????, ????? lightheaded, ?? ????? ???? ???????? ????? ????? ??? ????? ?????? ??????? ??? ? ????? ???  ?? ????? ?? ???, ????? ???? ??????? ????? ??? ????? ???? ?????????, ????? ???? ???? ? ????? ????? ???? ???? ??? ????? ?? engorgement ??????? ???? ???? ?????????? ??? 48 72 ????? ????? engorgement ????? ??? ?????? ????? ??? ????? ???? ???? ??? ??? ????? ????? ????? ??????? ??? ???, ????? ???? ?????? ???? ???? ???????? ????? ???? ??? ??? ??????? ???? ??????? ????? ????? ???? ??????? ???? ??? ?????? ????? ???? ???????????? ?? ?? ?????, ????? ? ??????? ????? ?????? ????? ? ???????? ???? ????? ??????? ????? ???? ?? ????? ????? ?????? ?????? ????? ????? ???? ???????? ????? ????? (?? ????????)? ?? ????? ????? ????? ???? ? ????? ????? ???? ??? ?????? ???? ??????? ???? ??? ??????? ??????? ?????? ????? ??? ????????? ?????????? ?????? ???????? ??????? ????? ????? ?? tearful ????? ???? ????? ?? ???????? ???? ??? ????? ??? ????? ??????? ?? ???????? ?? ???? ????? ??????? ???, ????? ????? caregiver ???? ?????????? ?????? ???, ?????? ?????? ?? ???????? ????? ???? ????? caregiver ????? ????? ?????? ????? caregiver ????? ??????? caregiver ????? ??????? ??????? ????? ????? ? ?????????? ????: ??????, ??????????, ? ?????????? (Tdap) ?? ?????? ? ?????????? (Td) ???? ?? ????? ? (????????? ????) ????? ?????? ?? ????? ????? ?????? ?????? ??-??-???? Tdap ?? Td ????? ??? ?? ?? ???? ???????????? ?? ?? Tdap ?? Td ??? ?????? ???? ????? ????? ????? ???? ??????? ?????? ???? ?????????? (chickenpox) ???? ???????????? ???? ????? ????? ?????????? ?? ????? ??? ?????? ??? ????? ?? ??????? ??? ??????? ??????????

## 2013-06-24 NOTE — Discharge Summary (Signed)
Obstetric Discharge Summary Reason for Admission: onset of labor Prenatal Procedures: none Intrapartum Procedures: spontaneous vaginal delivery Postpartum Procedures: none Complications-Operative and Postpartum: none Hemoglobin  Date Value Ref Range Status  06/24/2013 11.7* 12.0 - 15.0 g/dL Final     HCT  Date Value Ref Range Status  06/24/2013 35.7* 36.0 - 46.0 % Final    Discharge Diagnoses: Term Pregnancy-delivered  Hospital Course:  Kelsey Davies is a 23 y.o. G1P1001 who presented with active labor.  She had a uncomplicated SVD. She was able to ambulate, tolerate PO and void normally. She was discharged home with instructions for postpartum care.    Delivery Note At 7:26 AM a healthy female was delivered via Vaginal, Spontaneous Delivery (Presentation: ; Occiput Anterior).  APGAR: 7, 9; weight 5 lb 13.8 oz (2660 g).   Placenta status: Intact, Spontaneous.  Cord: 3 vessels with the following complications: None.  Cord pH: obtained and sent  Anesthesia: Epidural  Episiotomy: None Lacerations: None Suture Repair: NA Est. Blood Loss (mL): 300  Mom to postpartum.  Baby to Couplet care / Skin to Skin.  Wenda LowJoyner, James 06/24/2013, 7:16 AM  Physical Exam:  General: alert, cooperative and no distress Lochia: appropriate Uterine Fundus: firm DVT Evaluation: No evidence of DVT seen on physical exam.  Discharge Information: Date: 06/24/2013 Activity: pelvic rest Diet: routine Medications: Ibuprofen Baby feeding: plans to breastfeed/bottle Contraception: depo Condition: stable Instructions: refer to practice specific booklet Discharge to: home  Newborn Data: Live born female  Birth Weight: 5 lb 13.8 oz (2660 g) APGAR: 7, 9  Home with mother.  Wenda LowJames Joyner, MD [employer] [facility] PGY-1 06/24/2013, 7:16 AM   I spoke with and examined patient and agree with resident's note and plan of care. Discussed with patient using interpreter line. Tawana ScaleMichael Ryan Samnang Shugars, MD OB Fellow 06/24/2013 8:15  AM

## 2013-06-25 ENCOUNTER — Ambulatory Visit: Payer: Self-pay

## 2013-06-25 NOTE — Discharge Summary (Signed)
Attestation of Attending Supervision of Fellow: Evaluation and management procedures were performed by the Fellow under my supervision and collaboration.  I have reviewed the Fellow's note and chart, and I agree with the management and plan.    

## 2013-06-25 NOTE — Lactation Note (Signed)
This note was copied from the chart of Kelsey Claybon JabsMadhura Busser. Lactation Consultation Note Follow up consult:  [facility] [phone]#[id number].  Offered to assist with latching baby and mother refused but recently gave baby formula. Mother has volume guidelines. Denies problems.  Reviewed feeding 8-12 times a day, monitoring voids/stools and engorgement care.   Patient Name: Kelsey Davies JYNWG'NToday's Date: 06/25/2013 Reason for consult: Follow-up assessment   Maternal Data    Feeding Feeding Type: Breast Fed Length of feed: 10 min  LATCH Score/Interventions                      Lactation Tools Discussed/Used     Consult Status Consult Status: Complete    [name], [name] 06/25/2013, 10:07 AM

## 2013-08-02 ENCOUNTER — Telehealth: Payer: Self-pay | Admitting: General Practice

## 2013-08-02 ENCOUNTER — Ambulatory Visit: Payer: Medicaid Other | Admitting: Family Medicine

## 2013-08-02 NOTE — Telephone Encounter (Signed)
Patient no showed for pp visit. Called patient with pacific interpreter [phone], told patient it appears she missed her pp visit with us today and would she like this rescheduled. Patient stated yes please. Told patient our front office staff with contact her with a new appt. Patient verbalized understanding and had no further questions

## 2013-08-12 ENCOUNTER — Encounter: Payer: Self-pay | Admitting: Internal Medicine

## 2013-08-12 ENCOUNTER — Ambulatory Visit: Payer: Medicaid Other | Attending: Internal Medicine | Admitting: Internal Medicine

## 2013-08-12 VITALS — BP 110/76 | HR 59 | Temp 97.8°F | Resp 16 | Ht 60.0 in | Wt 113.0 lb

## 2013-08-12 DIAGNOSIS — M4120 Other idiopathic scoliosis, site unspecified: Secondary | ICD-10-CM | POA: Insufficient documentation

## 2013-08-12 DIAGNOSIS — Z09 Encounter for follow-up examination after completed treatment for conditions other than malignant neoplasm: Secondary | ICD-10-CM | POA: Insufficient documentation

## 2013-08-12 DIAGNOSIS — M792 Neuralgia and neuritis, unspecified: Secondary | ICD-10-CM

## 2013-08-12 DIAGNOSIS — G8929 Other chronic pain: Secondary | ICD-10-CM | POA: Insufficient documentation

## 2013-08-12 DIAGNOSIS — M545 Low back pain, unspecified: Secondary | ICD-10-CM | POA: Insufficient documentation

## 2013-08-12 DIAGNOSIS — M79609 Pain in unspecified limb: Secondary | ICD-10-CM | POA: Insufficient documentation

## 2013-08-12 DIAGNOSIS — IMO0002 Reserved for concepts with insufficient information to code with codable children: Secondary | ICD-10-CM

## 2013-08-12 MED ORDER — IBUPROFEN 400 MG PO TABS: 400.0000 mg | ORAL_TABLET | Freq: Four times a day (QID) | ORAL | Status: DC | PRN

## 2013-08-12 NOTE — Patient Instructions (Signed)
Scoliosis  Scoliosis is the name given to a spine that curves sideways.Scoliosis can cause twisting of your shoulders, hips, chest, back, and rib cage.   CAUSES   The cause of scoliosis is not always known. It may be caused by a birth defect or by a disease that can cause muscular dysfunction and imbalance, such as cerebral palsy and muscular dystrophy.   RISK FACTORS  Having a disease that causes muscle disease or dysfunction.  SIGNS AND SYMPTOMS  Scoliosis often has no signs or symptoms.If they are present, they may include:   Unequal size of one body side compared to the other (asymmetry).   Visible curvature of the spine.   Pain. The pain may limit physical activity.   Shortness of breath.   Bowel or bladder issues.  DIAGNOSIS  A skilled health care provider will perform an evaluation. This will involve:   Taking your history.   Performing a physical examination.   Performing neurological exam to detect nerve or muscle function loss.   Range of motion studies on the spine.   X-rays.  An MRI may also be obtained.  TREATMENT   Treatment varies depending on the nature, extent, and severity of the disease. If the curvature is not great, you may need only observation. A brace may be used to prevent scoliosis from progressing. A brace may also be needed during growth spurts. Physical therapy may be of benefit. Surgery may be required.   HOME CARE INSTRUCTIONS    Your health care provider may suggest exercises to strengthen your muscles. Perform them as directed.   Ask your health care provider before participating in any sports.    If you have been prescribed an orthopedic brace, wear it as instructed by your health care provider.  SEEK MEDICAL CARE IF:  Your brace causes the skin to become sore (chafe) or is uncomfortable.   SEEK IMMEDIATE MEDICAL CARE IF:   You have back pain that is not relieved by the medicines prescribed by your health care provider.    Your legs feel weak or you lose function  in your legs.   You lose some bowel or bladder control.   Document Released: 04/05/2000 Document Revised: 01/27/2013 Document Reviewed: 12/14/2012  ExitCare Patient Information 2014 [employer].

## 2013-08-12 NOTE — Progress Notes (Signed)
Patient ID: Kelsey Davies Alguire, female   DOB: 10-05-1990, 23 y.o.   MRN: [id number]   Kelsey Davies Fugate, is a 23 y.o. female  [id number]  NFA:[id number]  DOB - 10-05-1990  No chief complaint on file.       Subjective:   Kelsey Davies Route is a 23 y.o. female here today for a follow up visit. Patient reports that she continues to have chronic leg pain. Hasn't really changed. She is known to have scoliosis. She recently delivered her baby 6 weeks ago, she is here for tests as promised. No urinary or fecal incontinence. This pain has been going on for almost 8 years with no relief, mostly in both legs and thighs area. No swelling no redness. Denies history of trauma. Patient has No headache, No chest pain, No abdominal pain - No Nausea, No new weakness tingling or numbness, No Cough - SOB.  Problem  Low Back Pain  Neuropathic Pain    ALLERGIES: No Known Allergies  PAST MEDICAL HISTORY: Past Medical History  Diagnosis Date  . Medical history non-contributory     MEDICATIONS AT HOME: Prior to Admission medications   Medication Sig Start Date End Date Taking? Authorizing Provider  ibuprofen (ADVIL,MOTRIN) 400 MG tablet Take 1 tablet (400 mg total) by mouth every 6 (six) hours as needed for moderate pain (pain scale < 4). 08/12/13   Jeanann Lewandowskylugbemiga Ignazio Kincaid, MD  Prenatal Vit-Fe Fumarate-FA (PRENATAL MULTIVITAMIN) TABS tablet Take 1 tablet by mouth daily at 12 noon.    Historical Provider, MD     Objective:   Filed Vitals:   08/12/13 0922  BP: 110/76  Pulse: 59  Temp: 97.8 F (36.6 C)  TempSrc: Oral  Resp: 16  Height: 5' (1.524 m)  Weight: 113 lb (51.256 kg)  SpO2: 99%    Exam General appearance : Awake, alert, not in any distress. Speech Clear. Not toxic looking HEENT: Atraumatic and Normocephalic, pupils equally reactive to light and accomodation Neck: supple, no JVD. No cervical lymphadenopathy.  Chest:Good air entry bilaterally, no added sounds  CVS: S1 S2 regular, no  murmurs.  Abdomen: Bowel sounds present, Non tender and not distended with no gaurding, rigidity or rebound. Extremities: Negative leg raising. B/L Lower Ext shows no edema, both legs are warm to touch Neurology: Awake alert, and oriented X 3, CN II-XII intact, Non focal Skin:No Rash Wounds:N/A  Data Review No results found for this basename: HGBA1C     Assessment & Plan   1. Idiopathic scoliosis Stable  2. Low back pain Prescribed - ibuprofen (ADVIL,MOTRIN) 400 MG tablet; Take 1 tablet (400 mg total) by mouth every 6 (six) hours as needed for moderate pain (pain scale < 4).  Dispense: 30 tablet; Refill: 0  3. Neuropathic pain Now the patient has delivered her baby, we can go ahead with imaging studies to determine the cause of her ongoing pain - MR Lumbar Spine Wo Contrast; Future  Patient was counseled extensively on nutrition and exercise   Return in about 6 months (around 02/11/2014), or if symptoms worsen or fail to improve, for Follow up Pain and comorbidities.  The patient was given clear instructions to go to ER or return to medical center if symptoms don't improve, worsen or new problems develop. The patient verbalized understanding. The patient was told to call to get lab results if they haven't heard anything in the next week.   This note has been created with Education officer, environmentalDragon speech recognition software and smart phrase technology. Any transcriptional errors are unintentional.  Jeanann Lewandowskylugbemiga Tauni Sanks, MD, MHA, Jerrel IvoryFACP, FAAP [facility] and [facility] [place], [place] [phone]   08/12/2013, 10:04 AM

## 2013-08-12 NOTE — Progress Notes (Signed)
Pt is here following up on her b/l leg pain. Pt has an interpretor.

## 2013-08-17 ENCOUNTER — Encounter: Payer: Self-pay | Admitting: Family

## 2013-08-25 ENCOUNTER — Ambulatory Visit (HOSPITAL_COMMUNITY): Admission: RE | Admit: 2013-08-25 | Payer: Medicaid Other | Source: Ambulatory Visit

## 2013-09-16 ENCOUNTER — Ambulatory Visit (INDEPENDENT_AMBULATORY_CARE_PROVIDER_SITE_OTHER): Payer: Medicaid Other | Admitting: Family

## 2013-09-16 DIAGNOSIS — Z309 Encounter for contraceptive management, unspecified: Secondary | ICD-10-CM

## 2013-09-16 LAB — POCT PREGNANCY, URINE: Preg Test, Ur: NEGATIVE

## 2013-09-16 MED ORDER — FLUCONAZOLE 150 MG PO TABS: 150.0000 mg | ORAL_TABLET | Freq: Once | ORAL | Status: DC

## 2013-09-16 NOTE — Progress Notes (Signed)
Patient ID: Kelsey Davies, female   DOB: February 01, 1991, 23 y.o.   MRN: [id number] Pt desires Depo provera for birth control, denies unprotected sex.  Has continuous back pain and watery discharge.

## 2013-09-16 NOTE — Progress Notes (Signed)
  Subjective:     Kelsey Davies is a 23 y.o. female who presents for a postpartum visit. She is 10 weeks postpartum following a spontaneous vaginal delivery. I have fully reviewed the prenatal and intrapartum course. The delivery was at 41 gestational weeks. Outcome: spontaneous vaginal delivery. Anesthesia: epidural. Postpartum course has been unremarkable. Baby's course has been unremarkable. Baby is feeding by bottle Rush Barer.. Bleeding no bleeding. Bowel function is normal. Bladder function is normal. Patient is sexually active. Contraception method is Depo-Provera injections received at [facility] in April. Postpartum depression screening: negative.  Here today with reports of vaginal itching and white discharge.    The following portions of the patient's history were reviewed and updated as appropriate: allergies, current medications, past family history, past medical history, past social history, past surgical history and problem list.  Review of Systems Pertinent items are noted in HPI.   Objective:    BP 104/73  Pulse 102  Temp(Src) 98.2 F (36.8 C)  Wt 120 lb (54.432 kg)  LMP 09/11/2013  Breastfeeding? No  General:  alert, cooperative and appears stated age   Breasts:  inspection negative, no nipple discharge or bleeding, no masses or nodularity palpable  Lungs: clear to auscultation bilaterally  Heart:  regular rate and rhythm, S1, S2 normal, no murmur, click, rub or gallop  Abdomen: soft, non-tender; bowel sounds normal; no masses,  no organomegaly   Vulva:  normal; slightly red  Vagina: normal vagina, no discharge, exudate, lesion, or erythema; healed well  Cervix:  no cervical motion tenderness  Corpus: normal size, contour, position, consistency, mobility, non-tender  Adnexa:  normal adnexa  Rectal Exam: Not performed.        Assessment:     Normal postpartum exam. Pap smear not done at today's visit.  Vaginitis Plan:    1. Contraception: Depo-Provera injections 2.  Wet Prep sent; RX Diflucan 150 mg 3. Follow up as needed.   Eino Farber Paul Half, CNM

## 2013-09-17 LAB — WET PREP, GENITAL
Clue Cells Wet Prep HPF POC: NONE SEEN
TRICH WET PREP: NONE SEEN
WBC, Wet Prep HPF POC: NONE SEEN
Yeast Wet Prep HPF POC: NONE SEEN

## 2013-09-27 ENCOUNTER — Emergency Department (INDEPENDENT_AMBULATORY_CARE_PROVIDER_SITE_OTHER)
Admission: EM | Admit: 2013-09-27 | Discharge: 2013-09-27 | Disposition: A | Discharge status: Home / Self Care | Payer: Medicaid Other | Source: Home / Self Care | Attending: Emergency Medicine | Admitting: Emergency Medicine

## 2013-09-27 ENCOUNTER — Telehealth: Payer: Self-pay | Admitting: *Deleted

## 2013-09-27 ENCOUNTER — Encounter (HOSPITAL_COMMUNITY): Payer: Self-pay | Admitting: Emergency Medicine

## 2013-09-27 DIAGNOSIS — R319 Hematuria, unspecified: Secondary | ICD-10-CM

## 2013-09-27 DIAGNOSIS — M5430 Sciatica, unspecified side: Secondary | ICD-10-CM

## 2013-09-27 DIAGNOSIS — N390 Urinary tract infection, site not specified: Secondary | ICD-10-CM

## 2013-09-27 LAB — POCT URINALYSIS DIP (DEVICE)
BILIRUBIN URINE: NEGATIVE
GLUCOSE, UA: NEGATIVE mg/dL
Ketones, ur: NEGATIVE mg/dL
Nitrite: NEGATIVE
PH: 6.5 (ref 5.0–8.0)
Protein, ur: 100 mg/dL — AB
SPECIFIC GRAVITY, URINE: 1.01 (ref 1.005–1.030)
Urobilinogen, UA: 0.2 mg/dL (ref 0.0–1.0)

## 2013-09-27 LAB — POCT I-STAT, CHEM 8
BUN: 11 mg/dL (ref 6–23)
CALCIUM ION: 1.28 mmol/L — AB (ref 1.12–1.23)
CHLORIDE: 99 meq/L (ref 96–112)
Creatinine, Ser: 0.7 mg/dL (ref 0.50–1.10)
Glucose, Bld: 92 mg/dL (ref 70–99)
HCT: 47 % — ABNORMAL HIGH (ref 36.0–46.0)
Hemoglobin: 16 g/dL — ABNORMAL HIGH (ref 12.0–15.0)
Potassium: 3.9 mEq/L (ref 3.7–5.3)
SODIUM: 142 meq/L (ref 137–147)
TCO2: 26 mmol/L (ref 0–100)

## 2013-09-27 LAB — POCT PREGNANCY, URINE: Preg Test, Ur: NEGATIVE

## 2013-09-27 MED ORDER — NAPROXEN 375 MG PO TABS: 375.0000 mg | ORAL_TABLET | Freq: Two times a day (BID) | ORAL | Status: DC

## 2013-09-27 MED ORDER — NITROFURANTOIN MONOHYD MACRO 100 MG PO CAPS: 100.0000 mg | ORAL_CAPSULE | Freq: Two times a day (BID) | ORAL | Status: DC

## 2013-09-27 NOTE — Telephone Encounter (Signed)
Darin Engels from [facility] called to request NPI number.  NPI number given x 1, pt need to call medicaid and change card.  Clovis Pu, RN

## 2013-09-27 NOTE — ED Notes (Signed)
Bilateral hip pain, pain radiates to the front and down both legs, no known injury.  Pain for one week

## 2013-09-27 NOTE — ED Provider Notes (Signed)
CSN: [id number]     Arrival date & time 09/27/13  1530 History   First MD Initiated Contact with Patient 09/27/13 1647     Chief Complaint  Patient presents with  . Leg Pain   (Consider location/radiation/quality/duration/timing/severity/associated sxs/prior Treatment) HPI Comments: Patient presents with 18 days of dysuria and hematuria and one week of lower back pain with radiation to both of her lower extremities.  Denies fever/chills. No bowel bladder incontinence. No injury No PCP eval.    The history is provided by the patient.    Past Medical History  Diagnosis Date  . Medical history non-contributory    Past Surgical History  Procedure Laterality Date  . No past surgeries     Family History  Problem Relation Age of Onset  . Hearing loss Neg Hx    History  Substance Use Topics  . Smoking status: Never Smoker   . Smokeless tobacco: Never Used  . Alcohol Use: No   OB History   Grav Para Term Preterm Abortions TAB SAB Ect Mult Living   1 1 1       1      Review of Systems  Constitutional: Negative.   HENT: Negative.   Eyes: Negative.   Respiratory: Negative.   Cardiovascular: Negative.   Gastrointestinal: Negative.   Endocrine: Negative.   Genitourinary: Positive for dysuria and hematuria. Negative for urgency, frequency, flank pain, decreased urine volume, vaginal bleeding, vaginal discharge, enuresis, genital sores, vaginal pain, menstrual problem and pelvic pain.  Musculoskeletal: Positive for back pain. Negative for arthralgias, gait problem, joint swelling, myalgias, neck pain and neck stiffness.  Skin: Negative.   Allergic/Immunologic: Negative for immunocompromised state.  Neurological: Negative.     Allergies  Review of patient's allergies indicates no known allergies.  Home Medications   Prior to Admission medications   Medication Sig Start Date End Date Taking? Authorizing Provider  fluconazole (DIFLUCAN) 150 MG tablet Take 1 tablet (150 mg  total) by mouth once. 09/16/13   Melissa NoonWalidah N Muhammad, CNM  ibuprofen (ADVIL,MOTRIN) 400 MG tablet Take 1 tablet (400 mg total) by mouth every 6 (six) hours as needed for moderate pain (pain scale < 4). 08/12/13   Jeanann Lewandowskylugbemiga Jegede, MD  naproxen (NAPROSYN) 375 MG tablet Take 1 tablet (375 mg total) by mouth 2 (two) times daily. For back and leg pain 09/27/13   Ardis RowanJennifer Lee Dessie Tatem, PA  nitrofurantoin, macrocrystal-monohydrate, (MACROBID) 100 MG capsule Take 1 capsule (100 mg total) by mouth 2 (two) times daily. For infection in urine 09/27/13   Ardis RowanJennifer Lee Kendyll Huettner, PA  Prenatal Vit-Fe Fumarate-FA (PRENATAL MULTIVITAMIN) TABS tablet Take 1 tablet by mouth daily at 12 noon.    Historical Provider, MD   BP 99/69  Pulse 82  Temp(Src) 98 F (36.7 C) (Oral)  Resp 12  SpO2 98%  LMP 09/11/2013 Physical Exam  Nursing note and vitals reviewed. Constitutional: She is oriented to person, place, and time. She appears well-developed and well-nourished.  HENT:  Head: Normocephalic and atraumatic.  Eyes: Conjunctivae are normal. No scleral icterus.  Cardiovascular: Normal rate, regular rhythm and normal heart sounds.   Pulmonary/Chest: Effort normal and breath sounds normal. No respiratory distress. She has no wheezes.  Abdominal: Soft. Normal appearance and bowel sounds are normal. She exhibits no distension. There is no tenderness. There is no CVA tenderness.  Musculoskeletal: Normal range of motion.       Back:  CSM exam of both lower extremities normal and intact. Patellar DTRs 2+ bilaterally. Normal  great toe dorsiflexion +SLR B/L  Neurological: She is alert and oriented to person, place, and time.  Skin: Skin is warm and dry. No rash noted. No erythema.  Psychiatric: She has a normal mood and affect. Her behavior is normal.    ED Course  Procedures (including critical care time) Labs Review Labs Reviewed  POCT URINALYSIS DIP (DEVICE) - Abnormal; Notable for the following:    Hgb urine dipstick  LARGE (*)    Protein, ur 100 (*)    Leukocytes, UA SMALL (*)    All other components within normal limits  POCT I-STAT, CHEM 8 - Abnormal; Notable for the following:    Calcium, Ion 1.28 (*)    Hemoglobin 16.0 (*)    HCT 47.0 (*)    All other components within normal limits  URINE CULTURE  POCT PREGNANCY, URINE    Imaging Review No results found.   MDM   1. Sciatica   2. UTI (lower urinary tract infection)   3. Hematuria   Will treat sciatica with naprosyn x 10 days and send urine for C&S. UA suggests hematuria with possible lower UTI. Will treat with 5 day course of macrobid and advise PCP follow up if symptoms do not improve.     Jess Barters [place], [place] 09/27/13 [phone]

## 2013-09-27 NOTE — Discharge Instructions (Signed)
Back Exercises Back exercises help treat and prevent back injuries. The goal of back exercises is to increase the strength of your abdominal and back muscles and the flexibility of your back. These exercises should be started when you no longer have back pain. Back exercises include:  Pelvic Tilt. Lie on your back with your knees bent. Tilt your pelvis until the lower part of your back is against the floor. Hold this position 5 to 10 sec and repeat 5 to 10 times.  Knee to Chest. Pull first 1 knee up against your chest and hold for 20 to 30 seconds, repeat this with the other knee, and then both knees. This may be done with the other leg straight or bent, whichever feels better.  Sit-Ups or Curl-Ups. Bend your knees 90 degrees. Start with tilting your pelvis, and do a partial, slow sit-up, lifting your trunk only 30 to 45 degrees off the floor. Take at least 2 to 3 seconds for each sit-up. Do not do sit-ups with your knees out straight. If partial sit-ups are difficult, simply do the above but with only tightening your abdominal muscles and holding it as directed.  Hip-Lift. Lie on your back with your knees flexed 90 degrees. Push down with your feet and shoulders as you raise your hips a couple inches off the floor; hold for 10 seconds, repeat 5 to 10 times.  Back arches. Lie on your stomach, propping yourself up on bent elbows. Slowly press on your hands, causing an arch in your low back. Repeat 3 to 5 times. Any initial stiffness and discomfort should lessen with repetition over time.  Shoulder-Lifts. Lie face down with arms beside your body. Keep hips and torso pressed to floor as you slowly lift your head and shoulders off the floor. Do not overdo your exercises, especially in the beginning. Exercises may cause you some mild back discomfort which lasts for a few minutes; however, if the pain is more severe, or lasts for more than 15 minutes, do not continue exercises until you see your caregiver.  Improvement with exercise therapy for back problems is slow.  See your caregivers for assistance with developing a proper back exercise program. Document Released: 05/16/2004 Document Revised: 07/01/2011 Document Reviewed: 02/07/2011 [employer] Patient Information 2014 [employer].   Sciatica Sciatica is pain, weakness, numbness, or tingling along the path of the sciatic nerve. The nerve starts in the lower back and runs down the back of each leg. The nerve controls the muscles in the lower leg and in the back of the knee, while also providing sensation to the back of the thigh, lower leg, and the sole of your foot. Sciatica is a symptom of another medical condition. For instance, nerve damage or certain conditions, such as a herniated disk or bone spur on the spine, pinch or put pressure on the sciatic nerve. This causes the pain, weakness, or other sensations normally associated with sciatica. Generally, sciatica only affects one side of the body. CAUSES   Herniated or slipped disc.  Degenerative disk disease.  A pain disorder involving the narrow muscle in the buttocks (piriformis syndrome).  Pelvic injury or fracture.  Pregnancy.  Tumor (rare). SYMPTOMS  Symptoms can vary from mild to very severe. The symptoms usually travel from the low back to the buttocks and down the back of the leg. Symptoms can include:  Mild tingling or dull aches in the lower back, leg, or hip.  Numbness in the back of the calf or sole   the foot.  Burning sensations in the lower back, leg, or hip.  Sharp pains in the lower back, leg, or hip.  Leg weakness.  Severe back pain inhibiting movement. These symptoms may get worse with coughing, sneezing, laughing, or prolonged sitting or standing. Also, being overweight may worsen symptoms. DIAGNOSIS  Your caregiver will perform a physical exam to look for common symptoms of sciatica. He or she may ask you to do certain movements or activities that would  trigger sciatic nerve pain. Other tests may be performed to find the cause of the sciatica. These may include:  Blood tests.  X-rays.  Imaging tests, such as an MRI or CT scan. TREATMENT  Treatment is directed at the cause of the sciatic pain. Sometimes, treatment is not necessary and the pain and discomfort goes away on its own. If treatment is needed, your caregiver may suggest:  Over-the-counter medicines to relieve pain.  Prescription medicines, such as anti-inflammatory medicine, muscle relaxants, or narcotics.  Applying heat or ice to the painful area.  Steroid injections to lessen pain, irritation, and inflammation around the nerve.  Reducing activity during periods of pain.  Exercising and stretching to strengthen your abdomen and improve flexibility of your spine. Your caregiver may suggest losing weight if the extra weight makes the back pain worse.  Physical therapy.  Surgery to eliminate what is pressing or pinching the nerve, such as a bone spur or part of a herniated disk. HOME CARE INSTRUCTIONS   Only take over-the-counter or prescription medicines for pain or discomfort as directed by your caregiver.  Apply ice to the affected area for 20 minutes, 3 4 times a day for the first 48 72 hours. Then try heat in the same way.  Exercise, stretch, or perform your usual activities if these do not aggravate your pain.  Attend physical therapy sessions as directed by your caregiver.  Keep all follow-up appointments as directed by your caregiver.  Do not wear high heels or shoes that do not provide proper support.  Check your mattress to see if it is too soft. A firm mattress may lessen your pain and discomfort. SEEK IMMEDIATE MEDICAL CARE IF:   You lose control of your bowel or bladder (incontinence).  You have increasing weakness in the lower back, pelvis, buttocks, or legs.  You have redness or swelling of your back.  You have a burning sensation when you  urinate.  You have pain that gets worse when you lie down or awakens you at night.  Your pain is worse than you have experienced in the past.  Your pain is lasting longer than 4 weeks.  You are suddenly losing weight without reason. MAKE SURE YOU:  Understand these instructions.  Will watch your condition.  Will get help right away if you are not doing well or get worse. Document Released: 04/02/2001 Document Revised: 10/08/2011 Document Reviewed: 08/18/2011 [facility] Patient Information 2014 [place], [place].  Urinary Tract Infection Urinary tract infections (UTIs) can develop anywhere along your urinary tract. Your urinary tract is your body's drainage system for removing wastes and extra water. Your urinary tract includes two kidneys, two ureters, a bladder, and a urethra. Your kidneys are a pair of bean-shaped organs. Each kidney is about the size of your fist. They are located below your ribs, one on each side of your spine. CAUSES Infections are caused by microbes, which are microscopic organisms, including fungi, viruses, and bacteria. These organisms are so small that they can only be seen  through a microscope. Bacteria are the microbes that most commonly cause UTIs. SYMPTOMS  Symptoms of UTIs may vary by age and gender of the patient and by the location of the infection. Symptoms in young women typically include a frequent and intense urge to urinate and a painful, burning feeling in the bladder or urethra during urination. Older women and men are more likely to be tired, shaky, and weak and have muscle aches and abdominal pain. A fever may mean the infection is in your kidneys. Other symptoms of a kidney infection include pain in your back or sides below the ribs, nausea, and vomiting. DIAGNOSIS To diagnose a UTI, your caregiver will ask you about your symptoms. Your caregiver also will ask to provide a urine sample. The urine sample will be tested for bacteria and white blood  cells. White blood cells are made by your body to help fight infection. TREATMENT  Typically, UTIs can be treated with medication. Because most UTIs are caused by a bacterial infection, they usually can be treated with the use of antibiotics. The choice of antibiotic and length of treatment depend on your symptoms and the type of bacteria causing your infection. HOME CARE INSTRUCTIONS  If you were prescribed antibiotics, take them exactly as your caregiver instructs you. Finish the medication even if you feel better after you have only taken some of the medication.  Drink enough water and fluids to keep your urine clear or pale yellow.  Avoid caffeine, tea, and carbonated beverages. They tend to irritate your bladder.  Empty your bladder often. Avoid holding urine for long periods of time.  Empty your bladder before and after sexual intercourse.  After a bowel movement, women should cleanse from front to back. Use each tissue only once. SEEK MEDICAL CARE IF:   You have back pain.  You develop a fever.  Your symptoms do not begin to resolve within 3 days. SEEK IMMEDIATE MEDICAL CARE IF:   You have severe back pain or lower abdominal pain.  You develop chills.  You have nausea or vomiting.  You have continued burning or discomfort with urination. MAKE SURE YOU:   Understand these instructions.  Will watch your condition.  Will get help right away if you are not doing well or get worse. Document Released: 01/16/2005 Document Revised: 10/08/2011 Document Reviewed: 05/17/2011 [facility] Patient Information 2014 [place], [place].

## 2013-09-28 LAB — URINE CULTURE
Colony Count: 70000
Special Requests: NORMAL

## 2013-09-28 NOTE — ED Provider Notes (Signed)
Medical screening examination/treatment/procedure(s) were performed by non-physician practitioner and as supervising physician I was immediately available for consultation/collaboration.  Trenise Turay, M.D.  Tameya Kuznia C Kindsey Eblin, MD 09/28/13 0748 

## 2013-10-01 ENCOUNTER — Telehealth (HOSPITAL_COMMUNITY): Payer: Self-pay | Admitting: *Deleted

## 2013-10-01 NOTE — ED Notes (Addendum)
Urine culture: Diptheroids (Corynebacterium species)- no sensitivity.  Pt. treated with Macrobid. 6/9 [facility].  She wrote to call for clinical improvement.  I called pt. Her sister said to call back in 5 minutes. Kelsey Davies, Kelsey Davies Pt. called back.  Pt. verified x 2 and given result.  Pt. said she is getting better.  I told her if any worsening or not better after the medication is completed, to come back and get rechecked.  Pt. voiced understanding.

## 2013-10-14 ENCOUNTER — Ambulatory Visit: Payer: Medicaid Other | Admitting: Internal Medicine

## 2013-10-26 ENCOUNTER — Ambulatory Visit: Payer: Medicaid Other | Attending: Internal Medicine | Admitting: Internal Medicine

## 2013-10-26 ENCOUNTER — Encounter: Payer: Self-pay | Admitting: Internal Medicine

## 2013-10-26 VITALS — BP 109/75 | HR 76 | Temp 98.4°F | Resp 16 | Wt 126.8 lb

## 2013-10-26 DIAGNOSIS — R252 Cramp and spasm: Secondary | ICD-10-CM | POA: Diagnosis not present

## 2013-10-26 DIAGNOSIS — N926 Irregular menstruation, unspecified: Secondary | ICD-10-CM | POA: Insufficient documentation

## 2013-10-26 DIAGNOSIS — G8929 Other chronic pain: Secondary | ICD-10-CM | POA: Diagnosis not present

## 2013-10-26 DIAGNOSIS — M545 Low back pain, unspecified: Secondary | ICD-10-CM | POA: Diagnosis not present

## 2013-10-26 DIAGNOSIS — N921 Excessive and frequent menstruation with irregular cycle: Secondary | ICD-10-CM

## 2013-10-26 DIAGNOSIS — N920 Excessive and frequent menstruation with regular cycle: Secondary | ICD-10-CM

## 2013-10-26 MED ORDER — IBUPROFEN 400 MG PO TABS: 400.0000 mg | ORAL_TABLET | Freq: Four times a day (QID) | ORAL | Status: DC | PRN

## 2013-10-26 MED ORDER — GABAPENTIN 300 MG PO CAPS: 300.0000 mg | ORAL_CAPSULE | Freq: Every day | ORAL | Status: DC

## 2013-10-26 NOTE — Progress Notes (Signed)
MRN: [id number] Name: Kelsey Davies  Sex: female Age: 23 y.o. DOB: 10-20-1990  Allergies: Review of patient's allergies indicates no known allergies.  Chief Complaint  Patient presents with  . Leg Pain    HPI: Patient is 23 y.o. female who comes today for followup her has history of chronic lower back and leg pain, last month she went to the emergency room and was treated for UTI, she denies any worsening symptoms denies any urinary or stool incontinence, she is complaining of feeling cramps in her lower legs which is worse at night, she also reported to have irregular menstrual periods her last menstrual period started last month and is still persistently she has menstrual flow, patient ran out of her pain medication.  Past Medical History  Diagnosis Date  . Medical history non-contributory     Past Surgical History  Procedure Laterality Date  . No past surgeries        Medication List       This list is accurate as of: 10/26/13  4:04 PM.  Always use your most recent med list.               fluconazole 150 MG tablet  Commonly known as:  DIFLUCAN  Take 1 tablet (150 mg total) by mouth once.     gabapentin 300 MG capsule  Commonly known as:  NEURONTIN  Take 1 capsule (300 mg total) by mouth at bedtime.     ibuprofen 400 MG tablet  Commonly known as:  ADVIL,MOTRIN  Take 1 tablet (400 mg total) by mouth every 6 (six) hours as needed for moderate pain (pain scale < 4).     naproxen 375 MG tablet  Commonly known as:  NAPROSYN  Take 1 tablet (375 mg total) by mouth 2 (two) times daily. For back and leg pain     nitrofurantoin (macrocrystal-monohydrate) 100 MG capsule  Commonly known as:  MACROBID  Take 1 capsule (100 mg total) by mouth 2 (two) times daily. For infection in urine     prenatal multivitamin Tabs tablet  Take 1 tablet by mouth daily at 12 noon.        Meds ordered this encounter  Medications  . gabapentin (NEURONTIN) 300 MG capsule    Sig:  Take 1 capsule (300 mg total) by mouth at bedtime.    Dispense:  90 capsule    Refill:  1  . ibuprofen (ADVIL,MOTRIN) 400 MG tablet    Sig: Take 1 tablet (400 mg total) by mouth every 6 (six) hours as needed for moderate pain (pain scale < 4).    Dispense:  30 tablet    Refill:  1    Immunization History  Administered Date(s) Administered  . Influenza,inj,Quad PF,36+ Mos 02/23/2013  . Tdap 03/23/2013    Family History  Problem Relation Age of Onset  . Hearing loss Neg Hx     History  Substance Use Topics  . Smoking status: Never Smoker   . Smokeless tobacco: Never Used  . Alcohol Use: No    Review of Systems   As noted in HPI  Filed Vitals:   10/26/13 1543  BP: 109/75  Pulse: 76  Temp: 98.4 F (36.9 C)  Resp: 16    Physical Exam  Physical Exam  Eyes: Pupils are equal, round, and reactive to light.  Cardiovascular: Normal rate and regular rhythm.   Pulmonary/Chest: Breath sounds normal. No respiratory distress. She has no wheezes. She has no rales.  Musculoskeletal:   minimal lower lumbar paraspinal tenderness, SLR test negative, patient complains of muscle pull/tightness, DTR 2+ sensation intact equal strength both lower studies.    CBC    Component Value Date/Time   WBC 17.7* 06/24/2013 0553   RBC 4.18 06/24/2013 0553   HGB 16.0* 09/27/2013 1753   HCT 47.0* 09/27/2013 1753   PLT 270 06/24/2013 0553   MCV 85.4 06/24/2013 0553   LYMPHSABS 2.0 12/01/2012 0907   MONOABS 0.5 12/01/2012 0907   EOSABS 0.1 12/01/2012 0907   BASOSABS 0.0 12/01/2012 0907    CMP     Component Value Date/Time   NA 142 09/27/2013 1753   K 3.9 09/27/2013 1753   CL 99 09/27/2013 1753   GLUCOSE 92 09/27/2013 1753   BUN 11 09/27/2013 1753   CREATININE 0.70 09/27/2013 1753    No results found for this basename: chol, tri, ldl    No components found with this basename: hga1c    No results found for this basename: AST    Assessment and Plan  Menorrhagia with irregular cycle - Plan: Will  repeat her CBC with Differential, patient is advised to follow up with her gynecologist.  Chronic low back pain - Plan: ibuprofen (ADVIL,MOTRIN) 400 MG tablet  Cramp of both lower extremities - Plan: Trial of gabapentin (NEURONTIN) 300 MG capsule, will check blood chemistry COMPLETE METABOLIC PANEL WITH GFR     Return in about 6 months (around 04/28/2014), or if symptoms worsen or fail to improve.  Doris CheadleADVANI, Dayvon Dax, MD

## 2013-10-26 NOTE — Progress Notes (Signed)
Patient here with interpreter Complains of bilateral leg cramps Also states he started her menstrual cycle June 7 And has not stopped as of today-heavy at times with some cramping

## 2013-10-27 ENCOUNTER — Telehealth: Payer: Self-pay

## 2013-10-27 ENCOUNTER — Other Ambulatory Visit: Payer: Self-pay | Admitting: Internal Medicine

## 2013-10-27 DIAGNOSIS — R7989 Other specified abnormal findings of blood chemistry: Secondary | ICD-10-CM

## 2013-10-27 DIAGNOSIS — R1013 Epigastric pain: Secondary | ICD-10-CM

## 2013-10-27 DIAGNOSIS — R945 Abnormal results of liver function studies: Secondary | ICD-10-CM

## 2013-10-27 LAB — CBC WITH DIFFERENTIAL/PLATELET
BASOS PCT: 0 % (ref 0–1)
Basophils Absolute: 0 10*3/uL (ref 0.0–0.1)
EOS ABS: 0.1 10*3/uL (ref 0.0–0.7)
Eosinophils Relative: 1 % (ref 0–5)
HEMATOCRIT: 42.5 % (ref 36.0–46.0)
Hemoglobin: 13.9 g/dL (ref 12.0–15.0)
Lymphocytes Relative: 18 % (ref 12–46)
Lymphs Abs: 2.6 10*3/uL (ref 0.7–4.0)
MCH: 27.7 pg (ref 26.0–34.0)
MCHC: 32.7 g/dL (ref 30.0–36.0)
MCV: 84.7 fL (ref 78.0–100.0)
MONOS PCT: 4 % (ref 3–12)
Monocytes Absolute: 0.6 10*3/uL (ref 0.1–1.0)
Neutro Abs: 10.9 10*3/uL — ABNORMAL HIGH (ref 1.7–7.7)
Neutrophils Relative %: 77 % (ref 43–77)
Platelets: 391 10*3/uL (ref 150–400)
RBC: 5.02 MIL/uL (ref 3.87–5.11)
RDW: 13.7 % (ref 11.5–15.5)
WBC: 14.2 10*3/uL — ABNORMAL HIGH (ref 4.0–10.5)

## 2013-10-27 LAB — COMPLETE METABOLIC PANEL WITH GFR
ALK PHOS: 89 U/L (ref 39–117)
ALT: 108 U/L — AB (ref 0–35)
AST: 39 U/L — ABNORMAL HIGH (ref 0–37)
Albumin: 4.1 g/dL (ref 3.5–5.2)
BUN: 10 mg/dL (ref 6–23)
CO2: 25 meq/L (ref 19–32)
Calcium: 9.7 mg/dL (ref 8.4–10.5)
Chloride: 103 mEq/L (ref 96–112)
Creat: 0.56 mg/dL (ref 0.50–1.10)
GFR, Est Non African American: >89 mL/min
Glucose, Bld: 121 mg/dL — ABNORMAL HIGH (ref 70–99)
POTASSIUM: 4 meq/L (ref 3.5–5.3)
SODIUM: 137 meq/L (ref 135–145)
TOTAL PROTEIN: 7 g/dL (ref 6.0–8.3)
Total Bilirubin: 0.4 mg/dL (ref 0.2–1.2)

## 2013-10-27 NOTE — Telephone Encounter (Signed)
Message copied by Lestine MountJUAREZ, Topher Buenaventura L on Wed Oct 27, 2013  3:22 PM ------      Message from: Doris CheadleADVANI, DEEPAK      Created: Wed Oct 27, 2013  9:30 AM       Blood work reviewed, noticed abnormal LFTs and persistent leukocytosis, I have ordered hepatitis panel and abdominal ultrasound, advise patient to do blood work and ultrasound in few days       ------

## 2013-10-27 NOTE — Telephone Encounter (Signed)
Interpreter line used Patient is aware of her lab results appt given for blood work and ultra sound scheduled For 10/29/13 at 11:30

## 2013-10-28 ENCOUNTER — Ambulatory Visit: Payer: Medicaid Other | Attending: Internal Medicine

## 2013-10-28 DIAGNOSIS — R945 Abnormal results of liver function studies: Secondary | ICD-10-CM

## 2013-10-28 DIAGNOSIS — R7989 Other specified abnormal findings of blood chemistry: Secondary | ICD-10-CM

## 2013-10-29 ENCOUNTER — Ambulatory Visit (HOSPITAL_COMMUNITY)
Admission: RE | Admit: 2013-10-29 | Discharge: 2013-10-29 | Disposition: A | Discharge status: Home / Self Care | Payer: Medicaid Other | Source: Ambulatory Visit | Attending: Internal Medicine | Admitting: Internal Medicine

## 2013-10-29 DIAGNOSIS — K8020 Calculus of gallbladder without cholecystitis without obstruction: Secondary | ICD-10-CM | POA: Diagnosis not present

## 2013-10-29 DIAGNOSIS — R1013 Epigastric pain: Secondary | ICD-10-CM | POA: Diagnosis present

## 2013-10-29 LAB — HEPATITIS B SURFACE ANTIGEN: Hepatitis B Surface Ag: NEGATIVE

## 2013-10-29 LAB — HEPATITIS B SURFACE ANTIBODY,QUALITATIVE: Hep B S Ab: POSITIVE — AB

## 2013-10-29 LAB — HEPATITIS C ANTIBODY: HCV Ab: NEGATIVE

## 2013-11-01 ENCOUNTER — Telehealth: Payer: Self-pay

## 2013-11-01 DIAGNOSIS — K8020 Calculus of gallbladder without cholecystitis without obstruction: Secondary | ICD-10-CM

## 2013-11-01 NOTE — Telephone Encounter (Signed)
Interpreter line used Patient is aware of her utlra sound results Referral put in Epic for general surgery

## 2013-11-16 ENCOUNTER — Ambulatory Visit: Payer: Medicaid Other | Admitting: Internal Medicine

## 2013-11-18 ENCOUNTER — Ambulatory Visit (INDEPENDENT_AMBULATORY_CARE_PROVIDER_SITE_OTHER): Payer: Medicaid Other | Admitting: General Surgery

## 2013-12-20 ENCOUNTER — Telehealth: Payer: Self-pay | Admitting: Emergency Medicine

## 2013-12-20 ENCOUNTER — Telehealth: Payer: Self-pay | Admitting: Internal Medicine

## 2013-12-20 NOTE — Telephone Encounter (Signed)
Patient called in today about chest pains; patient was told to go to the ED and was then transferred to the nurse line for confirmation;

## 2013-12-20 NOTE — Telephone Encounter (Signed)
Patient called in today asking for an appointment for chest pain; patient was transferred to the nurse line

## 2013-12-31 ENCOUNTER — Telehealth: Payer: Self-pay | Admitting: Internal Medicine

## 2013-12-31 NOTE — Telephone Encounter (Signed)
Pt. Came in to schedule an appt. With Dr. Hyman Hopes, provider doesn't have any appointments available for next week, pt. Was advised to go to [facility] urgent care, she has been experiencing chest pains for two weeks.

## 2014-02-21 ENCOUNTER — Encounter: Payer: Self-pay | Admitting: Internal Medicine

## 2014-08-15 ENCOUNTER — Ambulatory Visit: Payer: Medicaid Other | Admitting: Internal Medicine

## 2014-09-14 ENCOUNTER — Ambulatory Visit: Payer: Medicaid Other | Admitting: Internal Medicine

## 2014-09-20 ENCOUNTER — Ambulatory Visit: Payer: Self-pay | Attending: Internal Medicine | Admitting: Internal Medicine

## 2014-09-20 ENCOUNTER — Encounter: Payer: Self-pay | Admitting: Internal Medicine

## 2014-09-20 ENCOUNTER — Other Ambulatory Visit: Payer: Self-pay

## 2014-09-20 VITALS — BP 111/78 | HR 102 | Temp 98.0°F | Resp 16 | Wt 139.4 lb

## 2014-09-20 DIAGNOSIS — R079 Chest pain, unspecified: Secondary | ICD-10-CM | POA: Insufficient documentation

## 2014-09-20 DIAGNOSIS — R000 Tachycardia, unspecified: Secondary | ICD-10-CM | POA: Insufficient documentation

## 2014-09-20 DIAGNOSIS — I471 Supraventricular tachycardia: Secondary | ICD-10-CM

## 2014-09-20 LAB — COMPLETE METABOLIC PANEL WITH GFR
ALT: 25 U/L (ref 0–35)
AST: 18 U/L (ref 0–37)
Albumin: 3.9 g/dL (ref 3.5–5.2)
Alkaline Phosphatase: 95 U/L (ref 39–117)
BUN: 7 mg/dL (ref 6–23)
CO2: 27 mEq/L (ref 19–32)
Calcium: 9.1 mg/dL (ref 8.4–10.5)
Chloride: 104 mEq/L (ref 96–112)
Creat: 0.53 mg/dL (ref 0.50–1.10)
GFR, Est African American: >89 mL/min
GFR, Est Non African American: >89 mL/min
Glucose, Bld: 86 mg/dL (ref 70–99)
Potassium: 4.9 mEq/L (ref 3.5–5.3)
Sodium: 140 mEq/L (ref 135–145)
Total Bilirubin: 0.3 mg/dL (ref 0.2–1.2)
Total Protein: 6.6 g/dL (ref 6.0–8.3)

## 2014-09-20 LAB — CBC WITH DIFFERENTIAL/PLATELET
Basophils Absolute: 0 10*3/uL (ref 0.0–0.1)
Basophils Relative: 0 % (ref 0–1)
Eosinophils Absolute: 0.1 10*3/uL (ref 0.0–0.7)
Eosinophils Relative: 1 % (ref 0–5)
HCT: 43 % (ref 36.0–46.0)
Hemoglobin: 13.9 g/dL (ref 12.0–15.0)
Lymphocytes Relative: 25 % (ref 12–46)
Lymphs Abs: 2.9 10*3/uL (ref 0.7–4.0)
MCH: 25.9 pg — ABNORMAL LOW (ref 26.0–34.0)
MCHC: 32.3 g/dL (ref 30.0–36.0)
MCV: 80.2 fL (ref 78.0–100.0)
MPV: 9.5 fL (ref 8.6–12.4)
Monocytes Absolute: 0.7 10*3/uL (ref 0.1–1.0)
Monocytes Relative: 6 % (ref 3–12)
Neutro Abs: 8 10*3/uL — ABNORMAL HIGH (ref 1.7–7.7)
Neutrophils Relative %: 68 % (ref 43–77)
Platelets: 475 10*3/uL — ABNORMAL HIGH (ref 150–400)
RBC: 5.36 MIL/uL — ABNORMAL HIGH (ref 3.87–5.11)
RDW: 14.4 % (ref 11.5–15.5)
WBC: 11.7 10*3/uL — ABNORMAL HIGH (ref 4.0–10.5)

## 2014-09-20 LAB — TSH: TSH: 1.599 u[IU]/mL (ref 0.350–4.500)

## 2014-09-20 MED ORDER — IBUPROFEN 400 MG PO TABS: 400.0000 mg | ORAL_TABLET | Freq: Four times a day (QID) | ORAL | Status: DC | PRN

## 2014-09-20 NOTE — Progress Notes (Signed)
MRN: [id number] Name: Kelsey Davies  Sex: female Age: 24 y.o. DOB: February 06, 1991  Allergies: Review of patient's allergies indicates no known allergies.  Chief Complaint  Patient presents with  . Chest Pain    HPI: Patient is 24 y.o. female who is accompanied with interpreter reports on and off chest pain, palpitation, shortness of breath for the last 2 weeks, as per patient she has not taken anything to help her with the symptoms, sometimes drinking warm water helps with the improvement of the symptoms, pain is 7/10 nonradiating,she denies any family history of heart disease currently denies any headache dizziness, EKG today shows sinus tachycardia, she denies any orthopnea or PND or leg swelling.she denies any family history of thyroid disease.  Past Medical History  Diagnosis Date  . Medical history non-contributory     Past Surgical History  Procedure Laterality Date  . No past surgeries        Medication List       This list is accurate as of: 09/20/14  3:35 PM.  Always use your most recent med list.               fluconazole 150 MG tablet  Commonly known as:  DIFLUCAN  Take 1 tablet (150 mg total) by mouth once.     gabapentin 300 MG capsule  Commonly known as:  NEURONTIN  Take 1 capsule (300 mg total) by mouth at bedtime.     ibuprofen 400 MG tablet  Commonly known as:  ADVIL,MOTRIN  Take 1 tablet (400 mg total) by mouth every 6 (six) hours as needed for moderate pain (pain scale < 4).     naproxen 375 MG tablet  Commonly known as:  NAPROSYN  Take 1 tablet (375 mg total) by mouth 2 (two) times daily. For back and leg pain     nitrofurantoin (macrocrystal-monohydrate) 100 MG capsule  Commonly known as:  MACROBID  Take 1 capsule (100 mg total) by mouth 2 (two) times daily. For infection in urine     prenatal multivitamin Tabs tablet  Take 1 tablet by mouth daily at 12 noon.        Meds ordered this encounter  Medications  . ibuprofen  (ADVIL,MOTRIN) 400 MG tablet    Sig: Take 1 tablet (400 mg total) by mouth every 6 (six) hours as needed for moderate pain (pain scale < 4).    Dispense:  30 tablet    Refill:  1    Immunization History  Administered Date(s) Administered  . Influenza,inj,Quad PF,36+ Mos 02/23/2013  . Tdap 03/23/2013    Family History  Problem Relation Age of Onset  . Hearing loss Neg Hx     History  Substance Use Topics  . Smoking status: Never Smoker   . Smokeless tobacco: Never Used  . Alcohol Use: No    Review of Systems   As noted in HPI  Filed Vitals:   09/20/14 1509  BP: 111/78  Pulse: 102  Temp: 98 F (36.7 C)  Resp: 16    Physical Exam  Physical Exam  Constitutional: No distress.  Eyes: EOM are normal. Pupils are equal, round, and reactive to light.  Cardiovascular: Regular rhythm.   tachycardic  Pulmonary/Chest: No respiratory distress. She has no wheezes. She has no rales.  some chest wall tenderness  Abdominal: Soft. There is no tenderness. There is no rebound and no guarding.    CBC    Component Value Date/Time   WBC 14.2* 10/26/2013  1609   RBC 5.02 10/26/2013 1609   HGB 13.9 10/26/2013 1609   HCT 42.5 10/26/2013 1609   PLT 391 10/26/2013 1609   MCV 84.7 10/26/2013 1609   LYMPHSABS 2.6 10/26/2013 1609   MONOABS 0.6 10/26/2013 1609   EOSABS 0.1 10/26/2013 1609   BASOSABS 0.0 10/26/2013 1609    CMP     Component Value Date/Time   NA 137 10/26/2013 1609   K 4.0 10/26/2013 1609   CL 103 10/26/2013 1609   CO2 25 10/26/2013 1609   GLUCOSE 121* 10/26/2013 1609   BUN 10 10/26/2013 1609   CREATININE 0.56 10/26/2013 1609   CREATININE 0.70 09/27/2013 1753   CALCIUM 9.7 10/26/2013 1609   PROT 7.0 10/26/2013 1609   ALBUMIN 4.1 10/26/2013 1609   AST 39* 10/26/2013 1609   ALT 108* 10/26/2013 1609   ALKPHOS 89 10/26/2013 1609   BILITOT 0.4 10/26/2013 1609   GFRNONAA >89 10/26/2013 1609   GFRAA >89 10/26/2013 1609    No results found for: CHOL  No  results found for: HGBA1C  Lab Results  Component Value Date/Time   AST 39* 10/26/2013 04:09 PM    Assessment and Plan  Chest pain, unspecified chest pain type - Plan: EKG 12-Lead shows sinus tachycardia, ?atypical chest pain, does not have any risk factors, tenderness with palpation, will try ibuprofen, check blood chemistry COMPLETE METABOLIC PANEL WITH GFR, ibuprofen (ADVIL,MOTRIN) 400 MG tablet  Sinus tachycardia - Plan: we'll checkTSH, CBC with Differential/Platelet   Return in about 3 months (around 12/21/2014), or if symptoms worsen or fail to improve.   This note has been created with Education officer, environmental. Any transcriptional errors are unintentional.    Doris Cheadle, MD

## 2014-09-20 NOTE — Progress Notes (Signed)
Patient here with interpreter Patient complains of chest pain on and off and some SOB Over the past couple of months Patient has not addressed this in the past

## 2014-09-26 ENCOUNTER — Telehealth: Payer: Self-pay

## 2014-09-26 NOTE — Telephone Encounter (Signed)
Interpreter line used Patient not available Left message on voice mail to return our call 

## 2014-09-26 NOTE — Telephone Encounter (Signed)
-----   Message from Doris Cheadleeepak Advani, MD sent at 09/21/2014  9:33 AM EDT ----- Call and let the patient know that her blood chemistry and thyroid test is normal, noticed borderline elevated WBC count which is improved compared to previous blood work, will repeat CBC on the following visit.

## 2015-01-12 ENCOUNTER — Emergency Department (INDEPENDENT_AMBULATORY_CARE_PROVIDER_SITE_OTHER)
Admission: EM | Admit: 2015-01-12 | Discharge: 2015-01-12 | Disposition: A | Discharge status: Home / Self Care | Payer: Self-pay | Source: Home / Self Care | Attending: Family Medicine | Admitting: Family Medicine

## 2015-01-12 ENCOUNTER — Encounter (HOSPITAL_COMMUNITY): Payer: Self-pay | Admitting: Emergency Medicine

## 2015-01-12 DIAGNOSIS — L258 Unspecified contact dermatitis due to other agents: Secondary | ICD-10-CM

## 2015-01-12 DIAGNOSIS — L240 Irritant contact dermatitis due to detergents: Secondary | ICD-10-CM

## 2015-01-12 MED ORDER — HYDROXYZINE HCL 25 MG PO TABS: 25.0000 mg | ORAL_TABLET | Freq: Four times a day (QID) | ORAL | Status: DC

## 2015-01-12 MED ORDER — FLUTICASONE PROPIONATE 0.05 % EX CREA: TOPICAL_CREAM | Freq: Two times a day (BID) | CUTANEOUS | Status: DC

## 2015-01-12 NOTE — ED Notes (Signed)
Pt has a rash under her axillary bilaterally.  It has been there for one week and she has itching over her entire body.

## 2015-01-12 NOTE — ED Provider Notes (Signed)
CSN: [id number]     Arrival date & time 01/12/15  1341 History   First MD Initiated Contact with Patient 01/12/15 1410     Chief Complaint  Patient presents with  . Rash   (Consider location/radiation/quality/duration/timing/severity/associated sxs/prior Treatment) Patient is a 24 y.o. female presenting with rash. The history is provided by the patient.  Rash Location:  Torso Torso rash location:  R axilla and L axilla Quality: blistering, dryness and itchiness   Quality: not painful, not red, not swelling and not weeping   Severity:  Mild Onset quality:  Gradual Duration:  1 week Progression:  Unchanged Chronicity:  New Context: new detergent/soap   Relieved by:  None tried Worsened by:  Nothing tried Ineffective treatments:  None tried Associated symptoms: no fever     Past Medical History  Diagnosis Date  . Medical history non-contributory    Past Surgical History  Procedure Laterality Date  . No past surgeries     Family History  Problem Relation Age of Onset  . Hearing loss Neg Hx    Social History  Substance Use Topics  . Smoking status: Never Smoker   . Smokeless tobacco: Never Used  . Alcohol Use: No   OB History    Gravida Para Term Preterm AB TAB SAB Ectopic Multiple Living   Review of Systems  Constitutional: Negative for fever.  Skin: Positive for rash. Negative for wound.  All other systems reviewed and are negative.   Allergies  Review of patient's allergies indicates no known allergies.  Home Medications   Prior to Admission medications   Medication Sig Start Date End Date Taking? Authorizing Provider  fluconazole (DIFLUCAN) 150 MG tablet Take 1 tablet (150 mg total) by mouth once. 09/16/13   Marlis Edelson, CNM  fluticasone (CUTIVATE) 0.05 % cream Apply topically 2 (two) times daily. 01/12/15   Linna Hoff, MD  gabapentin (NEURONTIN) 300 MG capsule Take 1 capsule (300 mg total) by mouth at bedtime. 10/26/13   Doris Cheadle, MD  hydrOXYzine (ATARAX/VISTARIL) 25 MG tablet Take 1 tablet (25 mg total) by mouth every 6 (six) hours. For itching 01/12/15   Linna Hoff, MD  ibuprofen (ADVIL,MOTRIN) 400 MG tablet Take 1 tablet (400 mg total) by mouth every 6 (six) hours as needed for moderate pain (pain scale < 4). 09/20/14   Doris Cheadle, MD  naproxen (NAPROSYN) 375 MG tablet Take 1 tablet (375 mg total) by mouth 2 (two) times daily. For back and leg pain 09/27/13   Ria Clock, PA  nitrofurantoin, macrocrystal-monohydrate, (MACROBID) 100 MG capsule Take 1 capsule (100 mg total) by mouth 2 (two) times daily. For infection in urine 09/27/13   Ria Clock, PA  Prenatal Vit-Fe Fumarate-FA (PRENATAL MULTIVITAMIN) TABS tablet Take 1 tablet by mouth daily at 12 noon.    Historical Provider, MD   Meds Ordered and Administered this Visit  Medications - No data to display  BP 100/70 mmHg  Pulse 106  Temp(Src) 98.1 F (36.7 C) (Oral)  Resp 16  SpO2 99% No data found.   Physical Exam  Constitutional: She is oriented to person, place, and time. She appears well-developed and well-nourished.  Musculoskeletal: She exhibits no tenderness.  Neurological: She is alert and oriented to person, place, and time.  Skin: Skin is warm and dry. Rash noted.  Peeling bilat axillary dermatitis, nonpustular, is pruritic, nontender.  Nursing note and vitals reviewed.   ED Course  Procedures (including critical care time)  Labs Review Labs Reviewed - No data to display  Imaging Review No results found.   Visual Acuity Review  Right Eye Distance:   Left Eye Distance:   Bilateral Distance:    Right Eye Near:   Left Eye Near:    Bilateral Near:         MDM   1. Contact dermatitis due to soap       Linna Hoff, MD 01/12/15 1429

## 2015-12-18 ENCOUNTER — Ambulatory Visit: Payer: Self-pay | Attending: Family Medicine | Admitting: Family Medicine

## 2015-12-18 ENCOUNTER — Encounter: Payer: Self-pay | Admitting: Family Medicine

## 2015-12-18 DIAGNOSIS — Z124 Encounter for screening for malignant neoplasm of cervix: Secondary | ICD-10-CM

## 2015-12-18 DIAGNOSIS — A499 Bacterial infection, unspecified: Secondary | ICD-10-CM

## 2015-12-18 DIAGNOSIS — N911 Secondary amenorrhea: Secondary | ICD-10-CM

## 2015-12-18 DIAGNOSIS — Z23 Encounter for immunization: Secondary | ICD-10-CM | POA: Insufficient documentation

## 2015-12-18 DIAGNOSIS — R6889 Other general symptoms and signs: Secondary | ICD-10-CM

## 2015-12-18 DIAGNOSIS — Z79899 Other long term (current) drug therapy: Secondary | ICD-10-CM | POA: Insufficient documentation

## 2015-12-18 DIAGNOSIS — Z0001 Encounter for general adult medical examination with abnormal findings: Secondary | ICD-10-CM

## 2015-12-18 DIAGNOSIS — N760 Acute vaginitis: Secondary | ICD-10-CM | POA: Insufficient documentation

## 2015-12-18 DIAGNOSIS — B9689 Other specified bacterial agents as the cause of diseases classified elsewhere: Secondary | ICD-10-CM

## 2015-12-18 DIAGNOSIS — Z13228 Encounter for screening for other metabolic disorders: Secondary | ICD-10-CM

## 2015-12-18 DIAGNOSIS — N912 Amenorrhea, unspecified: Secondary | ICD-10-CM | POA: Insufficient documentation

## 2015-12-18 LAB — COMPREHENSIVE METABOLIC PANEL
ALBUMIN: 4.4 g/dL (ref 3.6–5.1)
ALT: 21 U/L (ref 6–29)
AST: 17 U/L (ref 10–30)
Alkaline Phosphatase: 96 U/L (ref 33–115)
BILIRUBIN TOTAL: 0.3 mg/dL (ref 0.2–1.2)
BUN: 11 mg/dL (ref 7–25)
CO2: 28 mmol/L (ref 20–31)
CREATININE: 0.73 mg/dL (ref 0.50–1.10)
Calcium: 9.4 mg/dL (ref 8.6–10.2)
Chloride: 102 mmol/L (ref 98–110)
GLUCOSE: 81 mg/dL (ref 65–99)
Potassium: 4.4 mmol/L (ref 3.5–5.3)
SODIUM: 140 mmol/L (ref 135–146)
Total Protein: 7.1 g/dL (ref 6.1–8.1)

## 2015-12-18 LAB — LIPID PANEL
Cholesterol: 170 mg/dL (ref 125–200)
HDL: 37 mg/dL — ABNORMAL LOW (ref >=46)
LDL CALC: 82 mg/dL (ref <130)
TRIGLYCERIDES: 256 mg/dL — AB (ref <150)
Total CHOL/HDL Ratio: 4.6 Ratio (ref <=5.0)
VLDL: 51 mg/dL — AB (ref <30)

## 2015-12-18 LAB — POCT URINE PREGNANCY: PREG TEST UR: NEGATIVE

## 2015-12-18 MED ORDER — METRONIDAZOLE 0.75 % VA GEL: 1.0000 | Freq: Every day | VAGINAL | 0 refills | Status: DC

## 2015-12-18 NOTE — Patient Instructions (Signed)
Health Maintenance, Female Adopting a healthy lifestyle and getting preventive care can go a long way to promote health and wellness. Talk with your health care provider about what schedule of regular examinations is right for you. This is a good chance for you to check in with your provider about disease prevention and staying healthy. In between checkups, there are plenty of things you can do on your own. Experts have done a lot of research about which lifestyle changes and preventive measures are most likely to keep you healthy. Ask your health care provider for more information. WEIGHT AND DIET  Eat a healthy diet  Be sure to include plenty of vegetables, fruits, low-fat dairy products, and lean protein.  Do not eat a lot of foods high in solid fats, added sugars, or salt.  Get regular exercise. This is one of the most important things you can do for your health.  Most adults should exercise for at least 150 minutes each week. The exercise should increase your heart rate and make you sweat (moderate-intensity exercise).  Most adults should also do strengthening exercises at least twice a week. This is in addition to the moderate-intensity exercise.  Maintain a healthy weight  Body mass index (BMI) is a measurement that can be used to identify possible weight problems. It estimates body fat based on height and weight. Your health care provider can help determine your BMI and help you achieve or maintain a healthy weight.  For females 23 years of age and older:   A BMI below 18.5 is considered underweight.  A BMI of 18.5 to 24.9 is normal.  A BMI of 25 to 29.9 is considered overweight.  A BMI of 30 and above is considered obese.  Watch levels of cholesterol and blood lipids  You should start having your blood tested for lipids and cholesterol at 23 years of age, then have this test every 5 years.  You may need to have your cholesterol levels checked more often if:  Your lipid  or cholesterol levels are high.  You are older than 23 years of age.  You are at high risk for heart disease.  CANCER SCREENING   Lung Cancer  Lung cancer screening is recommended for adults 23-66 years old who are at high risk for lung cancer because of a history of smoking.  A yearly low-dose CT scan of the lungs is recommended for people who:  Currently smoke.  Have quit within the past 15 years.  Have at least a 30-pack-year history of smoking. A pack year is smoking an average of one pack of cigarettes a day for 1 year.  Yearly screening should continue until it has been 15 years since you quit.  Yearly screening should stop if you develop a health problem that would prevent you from having lung cancer treatment.  Breast Cancer  Practice breast self-awareness. This means understanding how your breasts normally appear and feel.  It also means doing regular breast self-exams. Let your health care provider know about any changes, no matter how small.  If you are in your 20s or 30s, you should have a clinical breast exam (CBE) by a health care provider every 1-3 years as part of a regular health exam.  If you are 23 or older, have a CBE every year. Also consider having a breast X-ray (mammogram) every year.  If you have a family history of breast cancer, talk to your health care provider about genetic screening.  If you  are at high risk for breast cancer, talk to your health care provider about having an MRI and a mammogram every year.  Breast cancer gene (BRCA) assessment is recommended for women who have family members with BRCA-related cancers. BRCA-related cancers include:  Breast.  Ovarian.  Tubal.  Peritoneal cancers.  Results of the assessment will determine the need for genetic counseling and BRCA1 and BRCA2 testing. Cervical Cancer Your health care provider may recommend that you be screened regularly for cancer of the pelvic organs (ovaries, uterus, and  vagina). This screening involves a pelvic examination, including checking for microscopic changes to the surface of your cervix (Pap test). You may be encouraged to have this screening done every 3 years, beginning at age 23.  For women ages 30-65, health care providers may recommend pelvic exams and Pap testing every 3 years, or they may recommend the Pap and pelvic exam, combined with testing for human papilloma virus (HPV), every 5 years. Some types of HPV increase your risk of cervical cancer. Testing for HPV may also be done on women of any age with unclear Pap test results.  Other health care providers may not recommend any screening for nonpregnant women who are considered low risk for pelvic cancer and who do not have symptoms. Ask your health care provider if a screening pelvic exam is right for you.  If you have had past treatment for cervical cancer or a condition that could lead to cancer, you need Pap tests and screening for cancer for at least 20 years after your treatment. If Pap tests have been discontinued, your risk factors (such as having a new sexual partner) need to be reassessed to determine if screening should resume. Some women have medical problems that increase the chance of getting cervical cancer. In these cases, your health care provider may recommend more frequent screening and Pap tests. Colorectal Cancer  This type of cancer can be detected and often prevented.  Routine colorectal cancer screening usually begins at 23 years of age and continues through 23 years of age.  Your health care provider may recommend screening at an earlier age if you have risk factors for colon cancer.  Your health care provider may also recommend using home test kits to check for hidden blood in the stool.  A small camera at the end of a tube can be used to examine your colon directly (sigmoidoscopy or colonoscopy). This is done to check for the earliest forms of colorectal  cancer.  Routine screening usually begins at age 50.  Direct examination of the colon should be repeated every 5-10 years through 23 years of age. However, you may need to be screened more often if early forms of precancerous polyps or small growths are found. Skin Cancer  Check your skin from head to toe regularly.  Tell your health care provider about any new moles or changes in moles, especially if there is a change in a mole's shape or color.  Also tell your health care provider if you have a mole that is larger than the size of a pencil eraser.  Always use sunscreen. Apply sunscreen liberally and repeatedly throughout the day.  Protect yourself by wearing long sleeves, pants, a wide-brimmed hat, and sunglasses whenever you are outside. HEART DISEASE, DIABETES, AND HIGH BLOOD PRESSURE   High blood pressure causes heart disease and increases the risk of stroke. High blood pressure is more likely to develop in:  People who have blood pressure in the high end   of the normal range (130-139/85-89 mm Hg).  People who are overweight or obese.  People who are African American.  If you are 38-23 years of age, have your blood pressure checked every 3-5 years. If you are 61 years of age or older, have your blood pressure checked every year. You should have your blood pressure measured twice--once when you are at a hospital or clinic, and once when you are not at a hospital or clinic. Record the average of the two measurements. To check your blood pressure when you are not at a hospital or clinic, you can use:  An automated blood pressure machine at a pharmacy.  A home blood pressure monitor.  If you are between 45 years and 39 years old, ask your health care provider if you should take aspirin to prevent strokes.  Have regular diabetes screenings. This involves taking a blood sample to check your fasting blood sugar level.  If you are at a normal weight and have a low risk for diabetes,  have this test once every three years after 23 years of age.  If you are overweight and have a high risk for diabetes, consider being tested at a younger age or more often. PREVENTING INFECTION  Hepatitis B  If you have a higher risk for hepatitis B, you should be screened for this virus. You are considered at high risk for hepatitis B if:  You were born in a country where hepatitis B is common. Ask your health care provider which countries are considered high risk.  Your parents were born in a high-risk country, and you have not been immunized against hepatitis B (hepatitis B vaccine).  You have HIV or AIDS.  You use needles to inject street drugs.  You live with someone who has hepatitis B.  You have had sex with someone who has hepatitis B.  You get hemodialysis treatment.  You take certain medicines for conditions, including cancer, organ transplantation, and autoimmune conditions. Hepatitis C  Blood testing is recommended for:  Everyone born from [birth year] through 1963.  Anyone with known risk factors for hepatitis C. Sexually transmitted infections (STIs)  You should be screened for sexually transmitted infections (STIs) including gonorrhea and chlamydia if:  You are sexually active and are younger than 24 years of age.  You are older than 23 years of age and your health care provider tells you that you are at risk for this type of infection.  Your sexual activity has changed since you were last screened and you are at an increased risk for chlamydia or gonorrhea. Ask your health care provider if you are at risk.  If you do not have HIV, but are at risk, it may be recommended that you take a prescription medicine daily to prevent HIV infection. This is called pre-exposure prophylaxis (PrEP). You are considered at risk if:  You are sexually active and do not regularly use condoms or know the HIV status of your partner(s).  You take drugs by injection.  You are sexually  active with a partner who has HIV. Talk with your health care provider about whether you are at high risk of being infected with HIV. If you choose to begin PrEP, you should first be tested for HIV. You should then be tested every 3 months for as long as you are taking PrEP.  PREGNANCY   If you are premenopausal and you may become pregnant, ask your health care provider about preconception counseling.  If you may  become pregnant, take 400 to 800 micrograms (mcg) of folic acid every day.  If you want to prevent pregnancy, talk to your health care provider about birth control (contraception). OSTEOPOROSIS AND MENOPAUSE   Osteoporosis is a disease in which the bones lose minerals and strength with aging. This can result in serious bone fractures. Your risk for osteoporosis can be identified using a bone density scan.  If you are 61 years of age or older, or if you are at risk for osteoporosis and fractures, ask your health care provider if you should be screened.  Ask your health care provider whether you should take a calcium or vitamin D supplement to lower your risk for osteoporosis.  Menopause may have certain physical symptoms and risks.  Hormone replacement therapy may reduce some of these symptoms and risks. Talk to your health care provider about whether hormone replacement therapy is right for you.  HOME CARE INSTRUCTIONS   Schedule regular health, dental, and eye exams.  Stay current with your immunizations.   Do not use any tobacco products including cigarettes, chewing tobacco, or electronic cigarettes.  If you are pregnant, do not drink alcohol.  If you are breastfeeding, limit how much and how often you drink alcohol.  Limit alcohol intake to no more than 1 drink per day for nonpregnant women. One drink equals 12 ounces of beer, 5 ounces of wine, or 1 ounces of hard liquor.  Do not use street drugs.  Do not share needles.  Ask your health care provider for help if  you need support or information about quitting drugs.  Tell your health care provider if you often feel depressed.  Tell your health care provider if you have ever been abused or do not feel safe at home.   This information is not intended to replace advice given to you by your health care provider. Make sure you discuss any questions you have with your health care provider.   Document Released: 10/22/2010 Document Revised: 04/29/2014 Document Reviewed: 03/10/2013 Elsevier Interactive Patient Education [employer].

## 2015-12-18 NOTE — Progress Notes (Signed)
Subjective:  Patient ID: Kelsey Davies, female    DOB: 04/02/1991  Age: 25 y.o. MRN: [id number]  CC: Annual Exam   HPI Kelsey Davies presents for a  complete physical exam. She complains of amenorrhea for the last 6 months which also dates back to when she had her Implanon removed in 05/2015. Prior to that she had been irregular period as she was postpartum.  Past Medical History:  Diagnosis Date  . Medical history non-contributory     Past Surgical History:  Procedure Laterality Date  . NO PAST SURGERIES      No Known Allergies   Outpatient Medications Prior to Visit  Medication Sig Dispense Refill  . fluconazole (DIFLUCAN) 150 MG tablet Take 1 tablet (150 mg total) by mouth once. (Patient not taking: Reported on 12/18/2015) 1 tablet 1  . fluticasone (CUTIVATE) 0.05 % cream Apply topically 2 (two) times daily. (Patient not taking: Reported on 12/18/2015) 60 g 0  . gabapentin (NEURONTIN) 300 MG capsule Take 1 capsule (300 mg total) by mouth at bedtime. (Patient not taking: Reported on 12/18/2015) 90 capsule 1  . hydrOXYzine (ATARAX/VISTARIL) 25 MG tablet Take 1 tablet (25 mg total) by mouth every 6 (six) hours. For itching (Patient not taking: Reported on 12/18/2015) 30 tablet 0  . ibuprofen (ADVIL,MOTRIN) 400 MG tablet Take 1 tablet (400 mg total) by mouth every 6 (six) hours as needed for moderate pain (pain scale < 4). (Patient not taking: Reported on 12/18/2015) 30 tablet 1  . naproxen (NAPROSYN) 375 MG tablet Take 1 tablet (375 mg total) by mouth 2 (two) times daily. For back and leg pain (Patient not taking: Reported on 12/18/2015) 20 tablet 0  . nitrofurantoin, macrocrystal-monohydrate, (MACROBID) 100 MG capsule Take 1 capsule (100 mg total) by mouth 2 (two) times daily. For infection in urine (Patient not taking: Reported on 12/18/2015) 10 capsule 0  . Prenatal Vit-Fe Fumarate-FA (PRENATAL MULTIVITAMIN) TABS tablet Take 1 tablet by mouth daily at 12 noon.     No  facility-administered medications prior to visit.     ROS Review of Systems  Constitutional: Negative for activity change, appetite change and fatigue.  HENT: Negative for congestion, sinus pressure and sore throat.   Eyes: Negative for visual disturbance.  Respiratory: Negative for cough, chest tightness, shortness of breath and wheezing.   Cardiovascular: Negative for chest pain and palpitations.  Gastrointestinal: Negative for abdominal distention, abdominal pain and constipation.  Endocrine: Negative for polydipsia.  Genitourinary: Positive for menstrual problem. Negative for dysuria and frequency.  Musculoskeletal: Negative for arthralgias and back pain.  Skin: Negative for rash.  Neurological: Negative for tremors, light-headedness and numbness.  Hematological: Does not bruise/bleed easily.  Psychiatric/Behavioral: Negative for agitation and behavioral problems.    Objective:  BP 92/64 (BP Location: Left Arm, Patient Position: Sitting, Cuff Size: Large)   Pulse 98   Temp 98 F (36.7 C) (Oral)   Wt 136 lb 12.8 oz (62.1 kg)   SpO2 100%   BMI 26.72 kg/m   BP/Weight 12/18/2015 01/12/2015 09/20/2014  Systolic BP 92 100 111  Diastolic BP 64 70 78  Wt. (Lbs) 136.8 - 139.4  BMI 26.72 - 27.22      Physical Exam  Constitutional: She is oriented to person, place, and time. She appears well-developed and well-nourished. No distress.  HENT:  Head: Normocephalic.  Right Ear: External ear normal.  Left Ear: External ear normal.  Nose: Nose normal.  Mouth/Throat: Oropharynx is clear and moist.  Eyes: Conjunctivae  and EOM are normal. Pupils are equal, round, and reactive to light.  Neck: Normal range of motion. No JVD present.  Cardiovascular: Normal rate, regular rhythm, normal heart sounds and intact distal pulses.  Exam reveals no gallop.   No murmur heard. Pulmonary/Chest: Effort normal and breath sounds normal. No respiratory distress. She has no wheezes. She has no rales.  She exhibits no tenderness. Right breast exhibits mass. Right breast exhibits no skin change and no tenderness. Left breast exhibits no mass, no skin change and no tenderness.  Abdominal: Soft. Bowel sounds are normal. She exhibits no distension and no mass. There is no tenderness.  Genitourinary:  Genitourinary Comments: Normal external genitalia Fishy odor of vaginal discharge Normal cervix Normal adnexa  Musculoskeletal: Normal range of motion. She exhibits no edema or tenderness.  Neurological: She is alert and oriented to person, place, and time. She has normal reflexes.  Skin: Skin is warm and dry. She is not diaphoretic.  Psychiatric: She has a normal mood and affect.     Assessment & Plan:   1. Encounter for general adult medical examination with abnormal findings  2. Need for immunization against influenza - Flu Vaccine QUAD 36+ mos IM (Fluarix)  3. Bacterial vaginosis - metroNIDAZOLE (METROGEL VAGINAL) 0.75 % vaginal gel; Place 1 Applicatorful vaginally at bedtime.  Dispense: 70 g; Refill: 0  4. Screening for cervical cancer - Cytology - PAP ([place])  5. Screening for metabolic disorder - Comprehensive metabolic panel - Lipid panel  6. Amenorrhea, secondary Likely secondary to recent implantable contraceptive use We'll follow-up next office visit. - POCT urine pregnancy : neg   Meds ordered this encounter  Medications  . metroNIDAZOLE (METROGEL VAGINAL) 0.75 % vaginal gel    Sig: Place 1 Applicatorful vaginally at bedtime.    Dispense:  70 g    Refill:  0    Follow-up: Return in 6 months (on 06/19/2016) for Follow-up on amenorrhea.   Jaclyn Shaggy MD

## 2015-12-20 LAB — CYTOLOGY - PAP

## 2015-12-22 ENCOUNTER — Telehealth: Payer: Self-pay

## 2015-12-22 LAB — CERVICOVAGINAL ANCILLARY ONLY
Bacterial vaginitis: NEGATIVE
Candida vaginitis: NEGATIVE

## 2015-12-22 NOTE — Telephone Encounter (Signed)
-----   Message from Jaclyn ShaggyEnobong Amao, MD sent at 12/22/2015  9:27 AM EDT ----- Vaginal culture is negative for chlamydia and bacterial vaginosis.

## 2015-12-22 NOTE — Telephone Encounter (Signed)
Writer called patient per Dr. Venetia NightAmao and discussed pap smear, cultures and blood test results.  Patient stated understanding regarding all the results.

## 2016-07-03 ENCOUNTER — Ambulatory Visit: Payer: Self-pay | Admitting: Internal Medicine

## 2017-01-16 ENCOUNTER — Ambulatory Visit: Payer: Self-pay | Attending: Family Medicine | Admitting: Family Medicine

## 2017-01-16 ENCOUNTER — Encounter: Payer: Self-pay | Admitting: Family Medicine

## 2017-01-16 VITALS — BP 93/63 | HR 92 | Temp 98.3°F | Resp 18 | Ht 59.0 in | Wt 140.6 lb

## 2017-01-16 DIAGNOSIS — R1030 Lower abdominal pain, unspecified: Secondary | ICD-10-CM | POA: Insufficient documentation

## 2017-01-16 DIAGNOSIS — R300 Dysuria: Secondary | ICD-10-CM | POA: Insufficient documentation

## 2017-01-16 DIAGNOSIS — N898 Other specified noninflammatory disorders of vagina: Secondary | ICD-10-CM | POA: Insufficient documentation

## 2017-01-16 DIAGNOSIS — Z113 Encounter for screening for infections with a predominantly sexual mode of transmission: Secondary | ICD-10-CM

## 2017-01-16 DIAGNOSIS — R102 Pelvic and perineal pain: Secondary | ICD-10-CM | POA: Insufficient documentation

## 2017-01-16 LAB — POCT URINALYSIS DIPSTICK
Bilirubin, UA: NEGATIVE
GLUCOSE UA: NEGATIVE
Ketones, UA: NEGATIVE
Nitrite, UA: NEGATIVE
PH UA: 7 (ref 5.0–8.0)
Protein, UA: 30
SPEC GRAV UA: 1.015 (ref 1.010–1.025)
UROBILINOGEN UA: 0.2 U/dL

## 2017-01-16 LAB — POCT URINE PREGNANCY: PREG TEST UR: NEGATIVE

## 2017-01-16 MED ORDER — METRONIDAZOLE 500 MG PO TABS: 500.0000 mg | ORAL_TABLET | Freq: Two times a day (BID) | ORAL | 0 refills | Status: DC

## 2017-01-16 NOTE — Patient Instructions (Addendum)
Pelvic Pain, Female °Pelvic pain is pain in your lower belly (abdomen), below your belly button and between your hips. The pain may start suddenly (acute), keep coming back (recurring), or last a long time (chronic). Pelvic pain that lasts longer than six months is considered chronic. There are many causes of pelvic pain. Sometimes the cause of your pelvic pain is not known. °Follow these instructions at home: °· Take over-the-counter and prescription medicines only as told by your doctor. °· Rest as told by your doctor. °· Do not have sex it if hurts. °· Keep a journal of your pelvic pain. Write down: °? When the pain started. °? Where the pain is located. °? What seems to make the pain better or worse, such as food or your menstrual cycle. °? Any symptoms you have along with the pain. °· Keep all follow-up visits as told by your doctor. This is important. °Contact a doctor if: °· Medicine does not help your pain. °· Your pain comes back. °· You have new symptoms. °· You have unusual vaginal discharge or bleeding. °· You have a fever or chills. °· You are having a hard time pooping (constipation). °· You have blood in your pee (urine) or poop (stool). °· Your pee smells bad. °· You feel weak or lightheaded. °Get help right away if: °· You have sudden pain that is very bad. °· Your pain continues to get worse. °· You have very bad pain and also have any of the following symptoms: °? A fever. °? Feeling stick to your stomach (nausea). °? Throwing up (vomiting). °? Being very sweaty. °· You pass out (lose consciousness). °This information is not intended to replace advice given to you by your health care provider. Make sure you discuss any questions you have with your health care provider. °Document Released: 09/25/2007 Document Revised: 05/03/2015 Document Reviewed: 01/27/2015 °Elsevier Interactive Patient Education © 2018 [employer] ° °

## 2017-01-16 NOTE — Progress Notes (Signed)
Subjective:  Patient ID: Kelsey Davies, female    DOB: October 07, 1990  Age: 26 y.o. MRN: [id number]  CC: Abdominal Pain (lower)   HPI Kelsey Davies presents for c/o vaginal discharge. Onset 3 weeks ago. Association symptoms: dysuria, itching, pain. She denies nay vaginal lesions. Discharge described as white, large in amount.  Reports 1 sexual partner with the last 3 months. History of nexplanon implant she reports have implant removed 1 year ago. LPM currently on menstruation.      Outpatient Medications Prior to Visit  Medication Sig Dispense Refill  . fluconazole (DIFLUCAN) 150 MG tablet Take 1 tablet (150 mg total) by mouth once. (Patient not taking: Reported on 12/18/2015) 1 tablet 1  . fluticasone (CUTIVATE) 0.05 % cream Apply topically 2 (two) times daily. (Patient not taking: Reported on 12/18/2015) 60 g 0  . gabapentin (NEURONTIN) 300 MG capsule Take 1 capsule (300 mg total) by mouth at bedtime. (Patient not taking: Reported on 12/18/2015) 90 capsule 1  . hydrOXYzine (ATARAX/VISTARIL) 25 MG tablet Take 1 tablet (25 mg total) by mouth every 6 (six) hours. For itching (Patient not taking: Reported on 12/18/2015) 30 tablet 0  . ibuprofen (ADVIL,MOTRIN) 400 MG tablet Take 1 tablet (400 mg total) by mouth every 6 (six) hours as needed for moderate pain (pain scale < 4). (Patient not taking: Reported on 12/18/2015) 30 tablet 1  . metroNIDAZOLE (METROGEL VAGINAL) 0.75 % vaginal gel Place 1 Applicatorful vaginally at bedtime. 70 g 0  . naproxen (NAPROSYN) 375 MG tablet Take 1 tablet (375 mg total) by mouth 2 (two) times daily. For back and leg pain (Patient not taking: Reported on 12/18/2015) 20 tablet 0  . nitrofurantoin, macrocrystal-monohydrate, (MACROBID) 100 MG capsule Take 1 capsule (100 mg total) by mouth 2 (two) times daily. For infection in urine (Patient not taking: Reported on 12/18/2015) 10 capsule 0  . Prenatal Vit-Fe Fumarate-FA (PRENATAL MULTIVITAMIN) TABS tablet Take 1 tablet by  mouth daily at 12 noon.     No facility-administered medications prior to visit.     ROS Review of Systems  Constitutional: Negative.   Respiratory: Negative.   Cardiovascular: Negative.   Gastrointestinal: Positive for abdominal pain (lower).  Genitourinary: Positive for dysuria, pelvic pain and vaginal discharge.  Skin: Negative.       Objective:  BP 93/63 (BP Location: Left Arm, Patient Position: Sitting, Cuff Size: Normal)   Pulse 92   Temp 98.3 F (36.8 C) (Oral)   Resp 18   Ht  (1.499 m)   Wt 140 lb 9.6 oz (63.8 kg)   LMP 01/14/2017 Comment: still on it  SpO2 99%   BMI 28.40 kg/m   BP/Weight 01/16/2017 12/18/2015 01/12/2015  Systolic BP 93 92 100  Diastolic BP 63 64 70  Wt. (Lbs) 140.6 136.8 -  BMI 28.4 26.72 -     Physical Exam  Eyes: Pupils are equal, round, and reactive to light. Conjunctivae are normal.  Cardiovascular: Normal rate, regular rhythm, normal heart sounds and intact distal pulses.   Pulmonary/Chest: Effort normal and breath sounds normal.  Abdominal: Soft. Bowel sounds are normal. There is tenderness (pelvic).  Genitourinary: Cervix exhibits discharge (bloody).  Skin: Skin is warm and dry.  Nursing note and vitals reviewed.    Assessment & Plan:   1. Pelvic pain in female  - Urinalysis Dipstick - POCT urine pregnancy - Cervicovaginal ancillary only  2. Dysuria  - Cervicovaginal ancillary only  3. Vaginal discharge  - POCT urine pregnancy -  Cervicovaginal ancillary only - metroNIDAZOLE (FLAGYL) 500 MG tablet; Take 1 tablet (500 mg total) by mouth 2 (two) times daily.  Dispense: 14 tablet; Refill: 0  4. Screening for STDs (sexually transmitted diseases)  - Cervicovaginal ancillary only - HEP, RPR, HIV Panel - HSV(herpes simplex vrs) 1+2 ab-IgG   Meds ordered this encounter  Medications  . metroNIDAZOLE (FLAGYL) 500 MG tablet    Sig: Take 1 tablet (500 mg total) by mouth 2 (two) times daily.    Dispense:  14 tablet      Refill:  0    Order Specific Question:   Supervising Provider    Answer:   Quentin Angst [id number]    Follow-up: Return if symptoms worsen or fail to improve.   Lizbeth Bark FNP

## 2017-01-16 NOTE — Progress Notes (Signed)
JAPatient is here for lower abdomen pain   Vaginal  discharge & itchy

## 2017-01-17 LAB — HEP, RPR, HIV PANEL
HIV Screen 4th Generation wRfx: NONREACTIVE
Hepatitis B Surface Ag: NEGATIVE
RPR: NONREACTIVE

## 2017-01-17 LAB — HSV(HERPES SIMPLEX VRS) I + II AB-IGG: HSV 1 Glycoprotein G Ab, IgG: 44.7 index — ABNORMAL HIGH (ref 0.00–0.90)

## 2017-01-20 ENCOUNTER — Other Ambulatory Visit: Payer: Self-pay | Admitting: Family Medicine

## 2017-01-20 DIAGNOSIS — B3731 Acute candidiasis of vulva and vagina: Secondary | ICD-10-CM | POA: Insufficient documentation

## 2017-01-20 DIAGNOSIS — B373 Candidiasis of vulva and vagina: Secondary | ICD-10-CM | POA: Insufficient documentation

## 2017-01-20 LAB — CERVICOVAGINAL ANCILLARY ONLY
BACTERIAL VAGINITIS: NEGATIVE
Candida vaginitis: POSITIVE — AB
Chlamydia: NEGATIVE
NEISSERIA GONORRHEA: NEGATIVE
TRICH (WINDOWPATH): NEGATIVE

## 2017-01-20 MED ORDER — FLUCONAZOLE 150 MG PO TABS: 150.0000 mg | ORAL_TABLET | Freq: Once | ORAL | 0 refills | Status: AC

## 2017-01-31 ENCOUNTER — Telehealth: Payer: Self-pay

## 2017-01-31 NOTE — Telephone Encounter (Signed)
interpreter name &  ID [id number]  CMA call regarding lab results   Patient Verify DOB

## 2017-01-31 NOTE — Progress Notes (Signed)
Spoke with patient check to see where have you sent her diflucan & it does not show that it have been order

## 2017-01-31 NOTE — Telephone Encounter (Signed)
-----   Message from Lizbeth Bark, [place] sent at 01/20/2017  6:11 PM EDT ----- HIV, Hepatitis B, and syphilis are all negative. -Herpes type 2 primarily responsible for genital herpes is negative. Herpes type 1 which is primarily responsible for cold sores is positive. When you have cold sores do not kiss anyone, share utensils, or have oral sex. -Gonorrhea, Chlamydia, BV,and Trichomonas were all negative. Yeast was positive. You will be prescribed diflucan. To reduce your risk of developing yeast infections don't douche, don't use scented soap or sprays, and wear cotton undergarments.

## 2017-07-25 ENCOUNTER — Other Ambulatory Visit (HOSPITAL_COMMUNITY)
Admission: RE | Admit: 2017-07-25 | Discharge: 2017-07-25 | Disposition: A | Discharge status: Home / Self Care | Payer: BLUE CROSS/BLUE SHIELD | Source: Ambulatory Visit | Attending: Nurse Practitioner | Admitting: Nurse Practitioner

## 2017-07-25 ENCOUNTER — Ambulatory Visit: Payer: BLUE CROSS/BLUE SHIELD | Attending: Nurse Practitioner | Admitting: Nurse Practitioner

## 2017-07-25 ENCOUNTER — Encounter: Payer: Self-pay | Admitting: Nurse Practitioner

## 2017-07-25 VITALS — BP 107/75 | HR 93 | Temp 98.7°F | Ht 59.0 in | Wt 144.6 lb

## 2017-07-25 DIAGNOSIS — R399 Unspecified symptoms and signs involving the genitourinary system: Secondary | ICD-10-CM | POA: Diagnosis not present

## 2017-07-25 DIAGNOSIS — R300 Dysuria: Secondary | ICD-10-CM | POA: Diagnosis not present

## 2017-07-25 DIAGNOSIS — N760 Acute vaginitis: Secondary | ICD-10-CM | POA: Diagnosis not present

## 2017-07-25 LAB — POCT URINALYSIS DIPSTICK
BILIRUBIN UA: NEGATIVE
Glucose, UA: NEGATIVE
Ketones, UA: NEGATIVE
Nitrite, UA: NEGATIVE
Protein, UA: NEGATIVE
RBC UA: NEGATIVE
Spec Grav, UA: 1.015 (ref 1.010–1.025)
Urobilinogen, UA: 0.2 E.U./dL
pH, UA: 7.5 (ref 5.0–8.0)

## 2017-07-25 NOTE — Patient Instructions (Addendum)
Sexually Transmitted Disease A sexually transmitted disease (STD) is a disease or infection that may be passed (transmitted) from person to person, usually during sexual activity. This may happen by way of saliva, semen, blood, vaginal mucus, or urine. Common STDs include:  Gonorrhea.  Chlamydia.  Syphilis.  HIV and AIDS.  Genital herpes.  Hepatitis B and C.  Trichomonas.  Human papillomavirus (HPV).  Pubic lice.  Scabies.  Mites.  Bacterial vaginosis.  What are the causes? An STD may be caused by bacteria, a virus, or parasites. STDs are often transmitted during sexual activity if one person is infected. However, they may also be transmitted through nonsexual means. STDs may be transmitted after:  Sexual intercourse with an infected person.  Sharing sex toys with an infected person.  Sharing needles with an infected person or using unclean piercing or tattoo needles.  Having intimate contact with the genitals, mouth, or rectal areas of an infected person.  Exposure to infected fluids during birth.  What are the signs or symptoms? Different STDs have different symptoms. Some people may not have any symptoms. If symptoms are present, they may include:  Painful or bloody urination.  Pain in the pelvis, abdomen, vagina, anus, throat, or eyes.  A skin rash, itching, or irritation.  Growths, ulcerations, blisters, or sores in the genital and anal areas.  Abnormal vaginal discharge with or without bad odor.  Penile discharge in men.  Fever.  Pain or bleeding during sexual intercourse.  Swollen glands in the groin area.  Yellow skin and eyes (jaundice). This is seen with hepatitis.  Swollen testicles.  Infertility.  Sores and blisters in the mouth.  How is this diagnosed? To make a diagnosis, your health care provider may:  Take a medical history.  Perform a physical exam.  Take a sample of any discharge to examine.  Swab the throat, cervix,  opening to the penis, rectum, or vagina for testing.  Test a sample of your first morning urine.  Perform blood tests.  Perform a Pap test, if this applies.  Perform a colposcopy.  Perform a laparoscopy.  How is this treated? Treatment depends on the STD. Some STDs may be treated but not cured.  Chlamydia, gonorrhea, trichomonas, and syphilis can be cured with antibiotic medicine.  Genital herpes, hepatitis, and HIV can be treated, but not cured, with prescribed medicines. The medicines lessen symptoms.  Genital warts from HPV can be treated with medicine or by freezing, burning (electrocautery), or surgery. Warts may come back.  HPV cannot be cured with medicine or surgery. However, abnormal areas may be removed from the cervix, vagina, or vulva.  If your diagnosis is confirmed, your recent sexual partners need treatment. This is true even if they are symptom-free or have a negative culture or evaluation. They should not have sex until their health care providers say it is okay.  Your health care provider may test you for infection again 3 months after treatment.  How is this prevented? Take these steps to reduce your risk of getting an STD:  Use latex condoms, dental dams, and water-soluble lubricants during sexual activity. Do not use petroleum jelly or oils.  Avoid having multiple sex partners.  Do not have sex with someone who has other sex partners.  Do not have sex with anyone you do not know or who is at high risk for an STD.  Avoid risky sex practices that can break your skin.  Do not have sex if you have open sores  on your mouth or skin.  Avoid drinking too much alcohol or taking illegal drugs. Alcohol and drugs can affect your judgment and put you in a vulnerable position.  Avoid engaging in oral and anal sex acts.  Get vaccinated for HPV and hepatitis. If you have not received these vaccines in the past, talk to your health care provider about whether one or  both might be right for you.  If you are at risk of being infected with HIV, it is recommended that you take a prescription medicine daily to prevent HIV infection. This is called pre-exposure prophylaxis (PrEP). You are considered at risk if: ? You are a man who has sex with other men (MSM). ? You are a heterosexual man or woman and are sexually active with more than one partner. ? You take drugs by injection. ? You are sexually active with a partner who has HIV.  Talk with your health care provider about whether you are at high risk of being infected with HIV. If you choose to begin PrEP, you should first be tested for HIV. You should then be tested every 3 months for as long as you are taking PrEP.  Contact a health care provider if:  See your health care provider.  Tell your sexual partner(s). They should be tested and treated for any STDs.  Do not have sex until your health care provider says it is okay. Get help right away if: Contact your health care provider right away if:  You have severe abdominal pain.  You are a man and notice swelling or pain in your testicles.  You are a woman and notice swelling or pain in your vagina.  This information is not intended to replace advice given to you by your health care provider. Make sure you discuss any questions you have with your health care provider. Document Released: 06/29/2002 Document Revised: 10/27/2015 Document Reviewed: 10/27/2012 Elsevier Interactive Patient Education  2018 [employer].  Vaginitis Vaginitis is a condition in which the vaginal tissue swells and becomes red (inflamed). This condition is most often caused by a change in the normal balance of bacteria and yeast that live in the vagina. This change causes an overgrowth of certain bacteria or yeast, which causes the inflammation. There are different types of vaginitis, but the most common types are:  Bacterial vaginosis.  Yeast infection  (candidiasis).  Trichomoniasis vaginitis. This is a sexually transmitted disease (STD).  Viral vaginitis.  Atrophic vaginitis.  Allergic vaginitis.  What are the causes? The cause of this condition depends on the type of vaginitis. It can be caused by:  Bacteria (bacterial vaginosis).  Yeast, which is a fungus (yeast infection).  A parasite (trichomoniasis vaginitis).  A virus (viral vaginitis).  Low hormone levels (atrophic vaginitis). Low hormone levels can occur during pregnancy, breastfeeding, or after menopause.  Irritants, such as bubble baths, scented tampons, and feminine sprays (allergic vaginitis).  Other factors can change the normal balance of the yeast and bacteria that live in the vagina. These include:  Antibiotic medicines.  Poor hygiene.  Diaphragms, vaginal sponges, spermicides, birth control pills, and intrauterine devices (IUD).  Sex.  Infection.  Uncontrolled diabetes.  A weakened defense (immune) system.  What increases the risk? This condition is more likely to develop in women who:  Smoke.  Use vaginal douches, scented tampons, or scented sanitary pads.  Wear tight-fitting pants.  Wear thong underwear.  Use oral birth control pills or an IUD.  Have sex without a  condom.  Have multiple sex partners.  Have an STD.  Frequently use the spermicide nonoxynol-9.  Eat lots of foods high in sugar.  Have uncontrolled diabetes.  Have low estrogen levels.  Have a weakened immune system from an immune disorder or medical treatment.  Are pregnant or breastfeeding.  What are the signs or symptoms? Symptoms vary depending on the cause of the vaginitis. Common symptoms include:  Abnormal vaginal discharge. ? The discharge is white, gray, or yellow with bacterial vaginosis. ? The discharge is thick, white, and cheesy with a yeast infection. ? The discharge is frothy and yellow or greenish with trichomoniasis.  A bad vaginal smell.  The smell is fishy with bacterial vaginosis.  Vaginal itching, pain, or swelling.  Sex that is painful.  Pain or burning when urinating.  Sometimes there are no symptoms. How is this diagnosed? This condition is diagnosed based on your symptoms and medical history. A physical exam, including a pelvic exam, will also be done. You may also have other tests, including:  Tests to determine the pH level (acidity or alkalinity) of your vagina.  A whiff test, to assess the odor that results when a sample of your vaginal discharge is mixed with a potassium hydroxide solution.  Tests of vaginal fluid. A sample will be examined under a microscope.  How is this treated? Treatment varies depending on the type of vaginitis you have. Your treatment may include:  Antibiotic creams or pills to treat bacterial vaginosis and trichomoniasis.  Antifungal medicines, such as vaginal creams or suppositories, to treat a yeast infection.  Medicine to ease discomfort if you have viral vaginitis. Your sexual partner should also be treated.  Estrogen delivered in a cream, pill, suppository, or vaginal ring to treat atrophic vaginitis. If vaginal dryness occurs, lubricants and moisturizing creams may help. You may need to avoid scented soaps, sprays, or douches.  Stopping use of a product that is causing allergic vaginitis. Then using a vaginal cream to treat the symptoms.  Follow these instructions at home: Lifestyle  Keep your genital area clean and dry. Avoid soap, and only rinse the area with water.  Do not douche or use tampons until your health care provider says it is okay to do so. Use sanitary pads, if needed.  Do not have sex until your health care provider approves. When you can return to sex, practice safe sex and use condoms.  Wipe from front to back. This avoids the spread of bacteria from the rectum to the vagina. General instructions  Take over-the-counter and prescription medicines  only as told by your health care provider.  If you were prescribed an antibiotic medicine, take or use it as told by your health care provider. Do not stop taking or using the antibiotic even if you start to feel better.  Keep all follow-up visits as told by your health care provider. This is important. How is this prevented?  Use mild, non-scented products. Do not use things that can irritate the vagina, such as fabric softeners. Avoid the following products if they are scented: ? Feminine sprays. ? Detergents. ? Tampons. ? Feminine hygiene products. ? Soaps or bubble baths.  Let air reach your genital area. ? Wear cotton underwear to reduce moisture buildup. ? Avoid wearing underwear while you sleep. ? Avoid wearing tight pants and underwear or nylons without a cotton panel. ? Avoid wearing thong underwear.  Take off any wet clothing, such as bathing suits, as soon as possible.  Practice  safe sex and use condoms. Contact a health care provider if:  You have abdominal pain.  You have a fever.  You have symptoms that last for more than 2-3 days. Get help right away if:  You have a fever and your symptoms suddenly get worse. Summary  Vaginitis is a condition in which the vaginal tissue becomes inflamed.This condition is most often caused by a change in the normal balance of bacteria and yeast that live in the vagina.  Treatment varies depending on the type of vaginitis you have.  Do not douche, use tampons , or have sex until your health care provider approves. When you can return to sex, practice safe sex and use condoms. This information is not intended to replace advice given to you by your health care provider. Make sure you discuss any questions you have with your health care provider. Document Released: 02/03/2007 Document Revised: 05/14/2016 Document Reviewed: 05/14/2016 Elsevier Interactive Patient Education  [employer].

## 2017-07-25 NOTE — Progress Notes (Signed)
Assessment & Plan:  Kelsey Davies was seen today for establish care and vaginal itching.  Diagnoses and all orders for this visit:  Acute vaginitis -     Cervicovaginal ancillary only   UTI symptoms -     Urinalysis Dipstick -     CULTURE, URINE COMPREHENSIVE Wipe from front to back after every elimination     Patient has been counseled on age-appropriate routine health concerns for screening and prevention. These are reviewed and up-to-date. Referrals have been placed accordingly. Immunizations are up-to-date or declined.    Subjective:   Chief Complaint  Patient presents with  . Establish Care    Pt is here to establish care.   . Vaginal Itching    Pt. stated she is having watery discharge, vaginal itchy, pain when she urinates, and vaginal odor for 2 weeks.    HPI Kelsey Davies 27 y.o. female presents to office today to [name]. She has symptoms of vaginitis and dysuria. VRI was used to communicate directly with patient for the entire encounter including providing detailed patient instructions.   Vaginitis: Patient complains of an abnormal vaginal discharge for 2 weeks. Vaginal symptoms include burning, discharge described as white and malodorous, local irritation, odor and urinary symptoms of dysuria and lower abdominal pain.Vulvar symptoms include none.STI Risk: Possible STD exposure.  Contraception: none  Urinary Tract Infection Patient complains of dysuria and frequency She has had symptoms for 2 weeks. Patient also complains of NONE. Patient denies back pain, fever and headache. Patient does not have a history of recurrent UTI.  Patient does not have a history of pyelonephritis.   Review of Systems  Constitutional: Negative.  Negative for chills, fever, malaise/fatigue and weight loss.  Respiratory: Negative.  Negative for cough, shortness of breath and wheezing.   Cardiovascular: Negative.  Negative for chest pain, orthopnea and leg swelling.  Gastrointestinal:  Negative for abdominal pain.  Genitourinary: Positive for dysuria. Negative for flank pain.       SEE HPI  Skin: Negative.  Negative for rash.  Psychiatric/Behavioral: Negative for suicidal ideas.    Past Medical History:  Diagnosis Date  . Medical history non-contributory     Past Surgical History:  Procedure Laterality Date  . NO PAST SURGERIES      Family History  Problem Relation Age of Onset  . Asthma Paternal Grandmother   . Hearing loss Neg Hx     Social History Reviewed with no changes to be made today.   Outpatient Medications Prior to Visit  Medication Sig Dispense Refill  . metroNIDAZOLE (FLAGYL) 500 MG tablet Take 1 tablet (500 mg total) by mouth 2 (two) times daily. 14 tablet 0   No facility-administered medications prior to visit.     No Known Allergies     Objective:    BP 107/75 (BP Location: Left Arm, Patient Position: Sitting, Cuff Size: Normal)   Pulse 93   Temp 98.7 F (37.1 C) (Oral)   Ht 4\' 11"  (1.499 m)   Wt 144 lb 9.6 oz (65.6 kg)   LMP 07/08/2017   SpO2 95%   BMI 29.21 kg/m  Wt Readings from Last 3 Encounters:  07/25/17 144 lb 9.6 oz (65.6 kg)  01/16/17 140 lb 9.6 oz (63.8 kg)  12/18/15 136 lb 12.8 oz (62.1 kg)    Physical Exam  Constitutional: She is oriented to person, place, and time. She appears well-developed and well-nourished. She is cooperative.  HENT:  Head: Normocephalic and atraumatic.  Cardiovascular: Normal rate,  regular rhythm and normal heart sounds. Exam reveals no gallop and no friction rub.  No murmur heard. Pulmonary/Chest: Effort normal and breath sounds normal. No tachypnea. No respiratory distress. She has no decreased breath sounds. She has no wheezes. She has no rhonchi. She has no rales. She exhibits no tenderness.  Abdominal: Bowel sounds are normal.  Musculoskeletal: Normal range of motion. She exhibits no edema.  Neurological: She is alert and oriented to person, place, and time. Coordination normal.    Skin: Skin is warm and dry.  Psychiatric: She has a normal mood and affect. Her behavior is normal. Judgment and thought content normal.  Nursing note and vitals reviewed.     Patient has been counseled extensively about nutrition and exercise as well as the importance of adherence with medications and regular follow-up. The patient was given clear instructions to go to ER or return to medical center if symptoms don't improve, worsen or new problems develop. The patient verbalized understanding.   Follow-up: Return if symptoms worsen or fail to improve.   Claiborne Rigg, FNP-BC [facility] [place], [place] [phone]   07/25/2017, 2:09 PM

## 2017-07-27 ENCOUNTER — Other Ambulatory Visit: Payer: Self-pay | Admitting: Nurse Practitioner

## 2017-07-27 MED ORDER — NITROFURANTOIN MONOHYD MACRO 100 MG PO CAPS: 100.0000 mg | ORAL_CAPSULE | Freq: Two times a day (BID) | ORAL | 0 refills | Status: AC

## 2017-07-28 ENCOUNTER — Other Ambulatory Visit: Payer: Self-pay | Admitting: Nurse Practitioner

## 2017-07-28 ENCOUNTER — Telehealth: Payer: Self-pay

## 2017-07-28 LAB — CERVICOVAGINAL ANCILLARY ONLY
Bacterial vaginitis: POSITIVE — AB
Candida vaginitis: NEGATIVE
Chlamydia: NEGATIVE
Neisseria Gonorrhea: NEGATIVE
TRICH (WINDOWPATH): NEGATIVE

## 2017-07-28 MED ORDER — METRONIDAZOLE 500 MG PO TABS: 500.0000 mg | ORAL_TABLET | Freq: Two times a day (BID) | ORAL | 0 refills | Status: AC

## 2017-07-28 NOTE — Telephone Encounter (Signed)
-----   Message from Claiborne RiggZelda W Fleming, NP sent at 07/27/2017 11:58 PM EDT ----- Urine culture showing Ecol in your urine. Will send antibiotic to pharmacy

## 2017-07-28 NOTE — Telephone Encounter (Signed)
CMA called patient but a member pick up the phone and stated patient is not at home.  Will try again later.

## 2017-07-28 NOTE — Telephone Encounter (Signed)
CMA called and inform patient on urine results and Rx has been sent to the pharmacy.  Patient understood.

## 2017-07-29 ENCOUNTER — Telehealth: Payer: Self-pay

## 2017-07-29 LAB — CULTURE, URINE COMPREHENSIVE

## 2017-07-29 NOTE — Telephone Encounter (Signed)
-----   Message from Claiborne RiggZelda W Fleming, NP sent at 07/28/2017  6:35 PM EDT ----- Mellody DrownWet prep positive for BV bacterial vaginosis. Will send script to pharmacy

## 2017-07-29 NOTE — Telephone Encounter (Signed)
CMA spoke to patient to inform on lab results and Rx been sent.  Patient understood and no questions.

## 2017-10-09 ENCOUNTER — Ambulatory Visit: Payer: BLUE CROSS/BLUE SHIELD

## 2018-04-08 ENCOUNTER — Ambulatory Visit (HOSPITAL_COMMUNITY)
Admission: EM | Admit: 2018-04-08 | Discharge: 2018-04-08 | Disposition: A | Discharge status: Home / Self Care | Payer: BLUE CROSS/BLUE SHIELD | Attending: Family Medicine | Admitting: Family Medicine

## 2018-04-08 ENCOUNTER — Encounter (HOSPITAL_COMMUNITY): Payer: Self-pay | Admitting: Emergency Medicine

## 2018-04-08 DIAGNOSIS — Z3201 Encounter for pregnancy test, result positive: Secondary | ICD-10-CM

## 2018-04-08 DIAGNOSIS — R109 Unspecified abdominal pain: Secondary | ICD-10-CM | POA: Diagnosis not present

## 2018-04-08 DIAGNOSIS — Z331 Pregnant state, incidental: Secondary | ICD-10-CM | POA: Insufficient documentation

## 2018-04-08 DIAGNOSIS — R0789 Other chest pain: Secondary | ICD-10-CM

## 2018-04-08 DIAGNOSIS — R1030 Lower abdominal pain, unspecified: Secondary | ICD-10-CM | POA: Insufficient documentation

## 2018-04-08 DIAGNOSIS — R112 Nausea with vomiting, unspecified: Secondary | ICD-10-CM | POA: Diagnosis not present

## 2018-04-08 DIAGNOSIS — Z3491 Encounter for supervision of normal pregnancy, unspecified, first trimester: Secondary | ICD-10-CM

## 2018-04-08 LAB — POCT URINALYSIS DIP (DEVICE)
BILIRUBIN URINE: NEGATIVE
GLUCOSE, UA: NEGATIVE mg/dL
Hgb urine dipstick: NEGATIVE
Ketones, ur: NEGATIVE mg/dL
NITRITE: NEGATIVE
Protein, ur: NEGATIVE mg/dL
SPECIFIC GRAVITY, URINE: 1.025 (ref 1.005–1.030)
UROBILINOGEN UA: 0.2 mg/dL (ref 0.0–1.0)
pH: 7 (ref 5.0–8.0)

## 2018-04-08 LAB — POCT PREGNANCY, URINE: Preg Test, Ur: POSITIVE — AB

## 2018-04-08 MED ORDER — PRENATAL COMPLETE 14-0.4 MG PO TABS: 1.0000 | ORAL_TABLET | Freq: Every day | ORAL | 5 refills | Status: DC

## 2018-04-08 MED ORDER — ONDANSETRON 8 MG PO TBDP: 8.0000 mg | ORAL_TABLET | Freq: Three times a day (TID) | ORAL | 1 refills | Status: DC | PRN

## 2018-04-08 NOTE — ED Provider Notes (Signed)
[facility]    CSN: [id number] Arrival date & time: 04/08/18  1112     History   Chief Complaint Chief Complaint  Patient presents with  . Abdominal Pain    HPI Kelsey Davies is a 27 y.o. female.   This is an established 27 year old woman who presents to the [facility] urgent care with abdominal pain.   Pt sts lower abd pain and pain in chest with inspiration; pt sts some vomiting at times also; pt sts LMP was October 28       Past Medical History:  Diagnosis Date  . Medical history non-contributory     Patient Active Problem List   Diagnosis Date Noted  . Candidal vaginitis 01/20/2017  . Low back pain 08/12/2013  . Neuropathic pain 08/12/2013  . Active labor 06/23/2013  . Language barrier, speaks Nepali only 12/01/2012  . Supervision of normal first pregnancy 10/26/2012  . Idiopathic scoliosis 09/25/2012    Past Surgical History:  Procedure Laterality Date  . NO PAST SURGERIES      OB History    Gravida  1   Para  1   Term  1   Preterm      AB      Living  1     SAB      TAB      Ectopic      Multiple      Live Births  1            Home Medications    Prior to Admission medications   Medication Sig Start Date End Date Taking? Authorizing Provider  ondansetron (ZOFRAN-ODT) 8 MG disintegrating tablet Take 1 tablet (8 mg total) by mouth every 8 (eight) hours as needed for nausea. 04/08/18   Elvina Sidle, MD  Prenatal Vit-Fe Fumarate-FA (PRENATAL COMPLETE) 14-0.4 MG TABS Take 1 tablet by mouth daily. 04/08/18   Elvina Sidle, MD    Family History Family History  Problem Relation Age of Onset  . Asthma Paternal Grandmother   . Hearing loss Neg Hx     Social History Social History   Tobacco Use  . Smoking status: Never Smoker  . Smokeless tobacco: Never Used  Substance Use Topics  . Alcohol use: No  . Drug use: No     Allergies   Patient has no known allergies.   Review of Systems Review of  Systems   Physical Exam Triage Vital Signs ED Triage Vitals  Enc Vitals Group     BP 04/08/18 1219 113/66     Pulse Rate 04/08/18 1219 80     Resp 04/08/18 1219 18     Temp 04/08/18 1219 98 F (36.7 C)     Temp Source 04/08/18 1219 Oral     SpO2 04/08/18 1219 100 %     Weight --      Height --      Head Circumference --      Peak Flow --      Pain Score 04/08/18 1220 2     Pain Loc --      Pain Edu? --      Excl. in GC? --    No data found.  Updated Vital Signs BP 113/66 (BP Location: Right Arm)   Pulse 80   Temp 98 F (36.7 C) (Oral)   Resp 18   LMP  (LMP Unknown)   SpO2 100%    Physical Exam Vitals signs and nursing note reviewed.  Constitutional:  Appearance: She is well-developed. She is obese.  HENT:     Head: Normocephalic.     Mouth/Throat:     Mouth: Mucous membranes are moist.  Eyes:     Extraocular Movements: Extraocular movements intact.     Pupils: Pupils are equal, round, and reactive to light.  Cardiovascular:     Rate and Rhythm: Normal rate.  Pulmonary:     Effort: Pulmonary effort is normal.     Breath sounds: Normal breath sounds.  Abdominal:     Tenderness: There is no abdominal tenderness.     Comments: Nontender abdomen  Skin:    General: Skin is warm and dry.  Neurological:     General: No focal deficit present.     Mental Status: She is alert.  Psychiatric:        Mood and Affect: Mood normal.        Behavior: Behavior normal.      UC Treatments / Results  Labs (all labs ordered are listed, but only abnormal results are displayed) Labs Reviewed  POCT URINALYSIS DIP (DEVICE) - Abnormal; Notable for the following components:      Result Value   Leukocytes, UA TRACE (*)    All other components within normal limits  POCT PREGNANCY, URINE - Abnormal; Notable for the following components:   Preg Test, Ur POSITIVE (*)    All other components within normal limits  URINE CULTURE    EKG None  Radiology No results  found.  Procedures Procedures (including critical care time)  Medications Ordered in UC Medications - No data to display  Initial Impression / Assessment and Plan / UC Course  I have reviewed the triage vital signs and the nursing notes.  Pertinent labs & imaging results that were available during my care of the patient were reviewed by me and considered in my medical decision making (see chart for details).    Final Clinical Impressions(s) / UC Diagnoses   Final diagnoses:  First trimester pregnancy  Intractable vomiting with nausea, unspecified vomiting type   Discharge Instructions   None    ED Prescriptions    Medication Sig Dispense Auth. Provider   Prenatal Vit-Fe Fumarate-FA (PRENATAL COMPLETE) 14-0.4 MG TABS Take 1 tablet by mouth daily. 60 each Elvina SidleLauenstein, Jontavius Rabalais, MD   ondansetron (ZOFRAN-ODT) 8 MG disintegrating tablet Take 1 tablet (8 mg total) by mouth every 8 (eight) hours as needed for nausea. 30 tablet Elvina SidleLauenstein, Aamina Skiff, MD     Controlled Substance Prescriptions [place] Controlled Substance Registry consulted? Not Applicable   Elvina SidleLauenstein, Cassia Fein, MD 04/08/18 1246

## 2018-04-08 NOTE — ED Triage Notes (Addendum)
Pt sts lower abd pain and pain in chest with inspiration; pt sts some vomiting at times also; pt sts LMP was in October

## 2018-04-09 LAB — URINE CULTURE

## 2018-04-22 NOTE — L&D Delivery Note (Addendum)
Delivery Note Progressed to  Complete dilation and pushed very effectively to SVD  At 9:15 PM a viable and healthy female was delivered via Vaginal, Spontaneous (Presentation:ROA ).  APGAR: 9, 9; weight  .    No difficulty with shoulders, which delivered spontaneously Placenta status: Spontaneous and grossly intact with 3 vessel Cord:  with the following complications: tight nuchal cord x 1  Anesthesia:  Epidural Episiotomy: None Lacerations: None Suture Repair: none Est. Blood Loss (mL):  50  Mom to postpartum.  Baby to Couplet care / Skin to Skin.  Kelsey Davies 11/17/2018, 9:51 PM  [facility] Please schedule this patient for Postpartum visit in: 4 weeks with the following provider: Any provider For C/S patients schedule nurse incision check in weeks 2 weeks: no High risk pregnancy complicated by: IUGR Delivery mode:  SVD Anticipated Birth Control:  Depo PP Procedures needed: none  Schedule Integrated BH visit: no

## 2018-05-11 ENCOUNTER — Encounter: Payer: Self-pay | Admitting: Student

## 2018-05-11 ENCOUNTER — Ambulatory Visit (INDEPENDENT_AMBULATORY_CARE_PROVIDER_SITE_OTHER): Payer: Medicaid Other | Admitting: Student

## 2018-05-11 ENCOUNTER — Other Ambulatory Visit (HOSPITAL_COMMUNITY)
Admission: RE | Admit: 2018-05-11 | Discharge: 2018-05-11 | Disposition: A | Discharge status: Home / Self Care | Payer: Medicaid Other | Source: Ambulatory Visit | Attending: Student | Admitting: Student

## 2018-05-11 VITALS — BP 116/74 | HR 99 | Wt 140.0 lb

## 2018-05-11 DIAGNOSIS — O99611 Diseases of the digestive system complicating pregnancy, first trimester: Secondary | ICD-10-CM

## 2018-05-11 DIAGNOSIS — Z3480 Encounter for supervision of other normal pregnancy, unspecified trimester: Secondary | ICD-10-CM | POA: Diagnosis not present

## 2018-05-11 DIAGNOSIS — K5900 Constipation, unspecified: Secondary | ICD-10-CM

## 2018-05-11 MED ORDER — DOCUSATE SODIUM 100 MG PO CAPS: 100.0000 mg | ORAL_CAPSULE | Freq: Two times a day (BID) | ORAL | 0 refills | Status: DC

## 2018-05-11 NOTE — Progress Notes (Signed)
Subjective:   Kelsey Davies is a 28 y.o. G2P1001 at 2517w6d by LMP being seen today for her first obstetrical visit.  Her obstetrical history is significant for none. Patient does intend to breast feed. Pregnancy history fully reviewed. This is a planned pregnancy with her spouse. Same FOB as previous pregnancy. Denies any complications with her last pregnancy.   Patient reports constipation. Last BM was 4-5 days ago. Endorses some lower abdominal cramping. Has not taken anything to treat her constipation. No vaginal bleeding, dysuria, or discharge. No n/v.   HISTORY: OB History  Gravida Para Term Preterm AB Living  2 1 1  0 0 1  SAB TAB Ectopic Multiple Live Births  0 0 0 0 1    # Outcome Date GA Lbr Len/2nd Weight Sex Delivery Anes PTL Lv  2 Current           1 Term 06/23/13 5351w0d / 01:04 5 lb 13.8 oz (2.66 kg) M Vag-Spont EPI  LIV     Name: Devin GoingSANYASI,BOY Maezie     Apgar1: 7  Apgar5: 9   Past Medical History:  Diagnosis Date  . Medical history non-contributory    Past Surgical History:  Procedure Laterality Date  . NO PAST SURGERIES     Family History  Problem Relation Age of Onset  . Asthma Paternal Grandmother   . Hearing loss Neg Hx    Social History   Tobacco Use  . Smoking status: Never Smoker  . Smokeless tobacco: Never Used  Substance Use Topics  . Alcohol use: No  . Drug use: No   No Known Allergies Current Outpatient Medications on File Prior to Visit  Medication Sig Dispense Refill  . ondansetron (ZOFRAN-ODT) 8 MG disintegrating tablet Take 1 tablet (8 mg total) by mouth every 8 (eight) hours as needed for nausea. 30 tablet 1  . Prenatal Vit-Fe Fumarate-FA (PRENATAL COMPLETE) 14-0.4 MG TABS Take 1 tablet by mouth daily. (Patient not taking: Reported on 05/11/2018) 60 each 5   No current facility-administered medications on file prior to visit.        Exam   Vitals:   05/11/18 1401  BP: 116/74  Pulse: 99  Weight: 140 lb (63.5 kg)   Fetal  Heart Rate (bpm): 159  Uterus:   enlarged 10-12 wks size  Pelvic Exam: Perineum: no hemorrhoids, normal perineum   Vulva: normal external genitalia, no lesions   Vagina:  normal mucosa, normal discharge   Cervix: no lesions and normal, pap smear done.    Adnexa: normal adnexa and no mass, fullness, tenderness   Bony Pelvis: average  System: General: well-developed, well-nourished female in no acute distress   Breast:  normal appearance, no masses or tenderness   Skin: normal coloration and turgor, no rashes   Neurologic: oriented, normal, negative, normal mood   Extremities: normal strength, tone, and muscle mass, ROM of all joints is normal   HEENT PERRLA, extraocular movement intact and sclera clear, anicteric   Mouth/Teeth mucous membranes moist, pharynx normal without lesions and dental hygiene good   Neck supple and no masses   Cardiovascular: regular rate and rhythm   Respiratory:  no respiratory distress, normal breath sounds   Abdomen: soft, non-tender; bowel sounds normal; no masses,  no organomegaly     Assessment:   Pregnancy: G2P1001 Patient Active Problem List   Diagnosis Date Noted  . Supervision of other normal pregnancy, antepartum 05/11/2018  . Low back pain 08/12/2013  . Neuropathic pain  08/12/2013  . Language barrier, speaks Nepali only 12/01/2012  . Idiopathic scoliosis 09/25/2012     Plan:  1. Supervision of other normal pregnancy, antepartum  - Culture, OB Urine - Obstetric Panel, Including HIV - US MFM OB COMP + 14 WK; Future - Cytology - PAP( [place]) - Inheritest(R) CF/SMA Panel - Hemoglobin A1c - Genetic Screening  2. Constipation during pregnancy in first trimester  - docusate sodium (COLACE) 100 MG capsule; Take 1 capsule (100 mg total) by mouth 2 (two) times daily.  Dispense: 60 capsule; Refill: 0   Initial labs drawn. Hgb A1C Flu vax up to date Continue prenatal vitamins. Genetic Screening discussed, Quad screen and NIPS:  requested. Will get AFP at next visit.  Ultrasound discussed; fetal anatomic survey: ordered. Problem list reviewed and updated. The nature of [facility] with multiple MDs and other Advanced Practice Providers was explained to patient; also emphasized that residents, students are part of our team. Routine obstetric precautions reviewed. Return in about 4 weeks (around 06/08/2018) for Routine OB.   Judeth Hornrin Eugene Zeiders 3:15 PM 05/11/18

## 2018-05-11 NOTE — Patient Instructions (Signed)
Safe Medications in Pregnancy   Acne: Benzoyl Peroxide Salicylic Acid  Backache/Headache: Tylenol: 2 regular strength every 4 hours OR              2 Extra strength every 6 hours  Colds/Coughs/Allergies: Benadryl (alcohol free) 25 mg every 6 hours as needed Breath right strips Claritin Cepacol throat lozenges Chloraseptic throat spray Cold-Eeze- up to three times per day Cough drops, alcohol free Flonase (by prescription only) Guaifenesin Mucinex Robitussin DM (plain only, alcohol free) Saline nasal spray/drops Sudafed (pseudoephedrine) & Actifed ** use only after [redacted] weeks gestation and if you do not have high blood pressure Tylenol Vicks Vaporub Zinc lozenges Zyrtec   Constipation: Colace Ducolax suppositories Fleet enema Glycerin suppositories Metamucil Milk of magnesia Miralax Senokot Smooth move tea  Diarrhea: Kaopectate Imodium A-D  *NO pepto Bismol  Hemorrhoids: Anusol Anusol HC Preparation H Tucks  Indigestion: Tums Maalox Mylanta Zantac  Pepcid  Insomnia: Benadryl (alcohol free) 25mg  every 6 hours as needed Tylenol PM Unisom, no Gelcaps  Leg Cramps: Tums MagGel  Nausea/Vomiting:  Bonine Dramamine Emetrol Ginger extract Sea bands Meclizine  Nausea medication to take during pregnancy:  Unisom (doxylamine succinate 25 mg tablets) Take one tablet daily at bedtime. If symptoms are not adequately controlled, the dose can be increased to a maximum recommended dose of two tablets daily (1/2 tablet in the morning, 1/2 tablet mid-afternoon and one at bedtime). Vitamin B6 100mg  tablets. Take one tablet twice a day (up to 200 mg per day).  Skin Rashes: Aveeno products Benadryl cream or 25mg  every 6 hours as needed Calamine Lotion 1% cortisone cream  Yeast infection: Gyne-lotrimin 7 Monistat 7  Gum/tooth pain: Anbesol  **If taking multiple medications, please check labels to avoid duplicating the same active ingredients **take  medication as directed on the label ** Do not exceed 4000 mg of tylenol in 24 hours **Do not take medications that contain aspirin or ibuprofen     Constipation, Adult Constipation is when a person has fewer bowel movements in a week than normal, has difficulty having a bowel movement, or has stools that are dry, hard, or larger than normal. Constipation may be caused by an underlying condition. It may become worse with age if a person takes certain medicines and does not take in enough fluids. Follow these instructions at home: Eating and drinking   Eat foods that have a lot of fiber, such as fresh fruits and vegetables, whole grains, and beans.  Limit foods that are high in fat, low in fiber, or overly processed, such as french fries, hamburgers, cookies, candies, and soda.  Drink enough fluid to keep your urine clear or pale yellow. General instructions  Exercise regularly or as told by your health care provider.  Go to the restroom when you have the urge to go. Do not hold it in.  Take over-the-counter and prescription medicines only as told by your health care provider. These include any fiber supplements.  Practice pelvic floor retraining exercises, such as deep breathing while relaxing the lower abdomen and pelvic floor relaxation during bowel movements.  Watch your condition for any changes.  Keep all follow-up visits as told by your health care provider. This is important. Contact a health care provider if:  You have pain that gets worse.  You have a fever.  You do not have a bowel movement after 4 days.  You vomit.  You are not hungry.  You lose weight.  You are bleeding from the anus.  You have thin, pencil-like stools. Get help right away if:  You have a fever and your symptoms suddenly get worse.  You leak stool or have blood in your stool.  Your abdomen is bloated.  You have severe pain in your abdomen.  You feel dizzy or you faint. This  information is not intended to replace advice given to you by your health care provider. Make sure you discuss any questions you have with your health care provider. Document Released: 01/05/2004 Document Revised: 10/27/2015 Document Reviewed: 09/27/2015 Elsevier Interactive Patient Education  2019 [employer]   Tests and Screening During Pregnancy Having certain tests and screenings during pregnancy is an important part of your prenatal care. These tests help your health care provider find problems that might affect your pregnancy. Some tests are done for all pregnant women, and some are optional. Most of the tests and screenings do not pose any risks for you or your baby. You may need additional testing if any routine tests indicate a problem. Tests and screenings done in early pregnancy Some tests and screenings you can expect to have in early pregnancy include:  Blood tests, such as: ? Complete blood count (CBC). This test is done to check your red and white blood cells. It can help identify a risk for anemia, infection, or bleeding. ? Blood typing. This test determines your blood type as well as whether you have a certain protein in your red blood cells (Rh factor). If you do not have this protein (Rh negative) and your baby does have it (Rh positive), your body could make antibodies to the Rh factor. This could be dangerous to your baby's health. ? Tests to check for diseases that can cause birth defects or can be passed to your baby, such as:  [place] measles (rubella). The test indicates whether you are immune to rubella.  Hepatitis B and C. All women are tested for hepatitis B. You may also be tested for hepatitis C if you have risk factors for the condition.  Zika virus infection. You may have a blood or urine test to check for this infection if you or your partner has traveled to an area where the virus occurs.  Urine testing. A urine sample can be tested for diabetes, protein in  your urine, and signs of infection.  Testing for sexually transmitted infections (STIs), such as HIV, syphilis, and chlamydia.  Testing for tuberculosis. You may have this skin test if you are at risk for tuberculosis.  Fetal ultrasound. This is an imaging study of your developing baby. It is done using sound waves and a computer. This test may be done at 11-14 weeks to confirm your pregnancy and help determine your due date. Tests and screenings done later in pregnancy Certain tests are done for the first time during later pregnancy. In addition, some of the tests that were done in early pregnancy are repeated at this time. Some common tests you can expect to have later in pregnancy include:  Rh antibody testing. If you are Rh negative, you will have a blood test at about 28 weeks of pregnancy to see if you are producing Rh antibodies. If you have not started to make antibodies, you will be given an injection to prevent you from making antibodies for the rest of your pregnancy.  Glucose screening. This tests your blood sugar to find out whether you are developing the type of diabetes that occurs during pregnancy (gestational diabetes). You may have this screening earlier  if you have risk factors for diabetes.  Screening for group B streptococcus (GBS). GBS is a type of bacteria that may live in your rectum or vagina. You may have GBS without any symptoms. GBS can spread to your baby during birth. This test involves doing a rectal and vaginal swab at 35-37 weeks of pregnancy. If testing is positive for GBS, you may be treated with antibiotic medicine.  CBC to check for anemia and blood-clotting ability.  Urine tests to check for protein, which can be a sign of a condition called preeclampsia.  Fetal ultrasound. This may be repeated at 16-20 weeks to check how your baby is growing and developing. Screening for birth defects Some birth defects are caused by abnormal genes passed down through  families. Early in your pregnancy, tests can be done to find out if your baby is at risk for a genetic disorder. This testing is optional. The type of testing recommended for you will depend on your family and medical history, your ethnicity, and your age. Testing may include:  Screening tests. These tests may include an ultrasound, blood tests, or a combination of both. The blood tests are used to check for abnormal genes, and the ultrasound is done to look for early birth defects.  Carrier screening. This test involves checking the blood or saliva of both parents to see if they carry abnormal genes that could be passed down to a baby. If genetic screening shows that your baby is at risk for a genetic defect, additional diagnostic testing may be recommended, such as:  Amniocentesis. This involves testing a sample of fluid from your womb (amniotic fluid).  Chorionic villus sampling. In this test, a sample of cells from your placenta is checked for abnormal cells. Unlike other tests done during pregnancy, diagnostic testing does have some risk for your pregnancy. Talk to your health care provider about the risks and benefits of genetic testing. Where to find more information  American Pregnancy Association: americanpregnancy.org/prenatal-testing  Office on Women's Health: MightyReward.co.nz  March of Dimes: marchofdimes.org/pregnancy Questions to ask your health care provider  What routine tests are recommended for me?  When and how will these tests be done?  When will I get the results of routine tests?  What do the results of these tests mean for me or my baby?  Do you recommend any genetic screening tests? Which ones?  Should I see a genetic counselor before having genetic screening? Summary  Having tests and screenings during pregnancy is an important part of your prenatal care.  In early pregnancy, testing may be done to check blood type, Rh status, and risks for  various conditions that can affect your baby.  Fetal ultrasound may be done in early pregnancy to confirm a pregnancy and later to look for any birth defects.  Later in pregnancy, tests may include screening for GBS and gestational diabetes.  Genetic testing is optional. Consider talking to a genetic counselor about this testing. This information is not intended to replace advice given to you by your health care provider. Make sure you discuss any questions you have with your health care provider. Document Released: 06/23/2017 Document Revised: 06/23/2017 Document Reviewed: 06/23/2017 Elsevier Interactive Patient Education  2019 [employer].

## 2018-05-13 LAB — CULTURE, OB URINE

## 2018-05-13 LAB — URINE CULTURE, OB REFLEX

## 2018-05-14 LAB — CYTOLOGY - PAP
Bacterial vaginitis: NEGATIVE
Candida vaginitis: NEGATIVE
Chlamydia: NEGATIVE
Diagnosis: NEGATIVE
NEISSERIA GONORRHEA: NEGATIVE

## 2018-05-22 LAB — OBSTETRIC PANEL, INCLUDING HIV
Antibody Screen: NEGATIVE
Basophils Absolute: 0.1 10*3/uL (ref 0.0–0.2)
Basos: 0 %
EOS (ABSOLUTE): 0.3 10*3/uL (ref 0.0–0.4)
Eos: 3 %
HEMOGLOBIN: 12.8 g/dL (ref 11.1–15.9)
HIV Screen 4th Generation wRfx: NONREACTIVE
Hematocrit: 37.8 % (ref 34.0–46.6)
Hepatitis B Surface Ag: NEGATIVE
IMMATURE GRANULOCYTES: 1 %
Immature Grans (Abs): 0.1 10*3/uL (ref 0.0–0.1)
Lymphocytes Absolute: 2.3 10*3/uL (ref 0.7–3.1)
Lymphs: 20 %
MCH: 27.4 pg (ref 26.6–33.0)
MCHC: 33.9 g/dL (ref 31.5–35.7)
MCV: 81 fL (ref 79–97)
Monocytes Absolute: 0.5 10*3/uL (ref 0.1–0.9)
Monocytes: 4 %
Neutrophils Absolute: 8.1 10*3/uL — ABNORMAL HIGH (ref 1.4–7.0)
Neutrophils: 72 %
PLATELETS: 435 10*3/uL (ref 150–450)
RBC: 4.68 x10E6/uL (ref 3.77–5.28)
RDW: 14.3 % (ref 11.7–15.4)
RPR Ser Ql: NONREACTIVE
Rh Factor: POSITIVE
Rubella Antibodies, IGG: 30.9 index (ref >0.99)
WBC: 11.3 10*3/uL — ABNORMAL HIGH (ref 3.4–10.8)

## 2018-05-22 LAB — INHERITEST(R) CF/SMA PANEL

## 2018-05-22 LAB — HEMOGLOBIN A1C
Est. average glucose Bld gHb Est-mCnc: 103 mg/dL
HEMOGLOBIN A1C: 5.2 % (ref 4.8–5.6)

## 2018-06-08 ENCOUNTER — Encounter: Payer: Self-pay | Admitting: Internal Medicine

## 2018-06-08 ENCOUNTER — Other Ambulatory Visit: Payer: Self-pay

## 2018-06-08 ENCOUNTER — Ambulatory Visit (INDEPENDENT_AMBULATORY_CARE_PROVIDER_SITE_OTHER): Payer: Medicaid Other | Admitting: Internal Medicine

## 2018-06-08 VITALS — BP 113/70 | HR 112 | Wt 140.1 lb

## 2018-06-08 DIAGNOSIS — Z3482 Encounter for supervision of other normal pregnancy, second trimester: Secondary | ICD-10-CM

## 2018-06-08 DIAGNOSIS — M545 Low back pain, unspecified: Secondary | ICD-10-CM

## 2018-06-08 DIAGNOSIS — O26892 Other specified pregnancy related conditions, second trimester: Secondary | ICD-10-CM

## 2018-06-08 DIAGNOSIS — Z3480 Encounter for supervision of other normal pregnancy, unspecified trimester: Secondary | ICD-10-CM

## 2018-06-08 DIAGNOSIS — Z3A15 15 weeks gestation of pregnancy: Secondary | ICD-10-CM

## 2018-06-08 MED ORDER — COMFORT FIT MATERNITY SUPP LG MISC: 1.0000 | Freq: Every day | 0 refills | Status: DC | PRN

## 2018-06-08 MED ORDER — PREPLUS 27-1 MG PO TABS: 1.0000 | ORAL_TABLET | Freq: Every day | ORAL | 3 refills | Status: DC

## 2018-06-08 NOTE — Progress Notes (Signed)
   PRENATAL VISIT NOTE  Subjective:  Kelsey Davies is a 28 y.o. G2P1001 at [redacted]w[redacted]d being seen today for ongoing prenatal care.  She is currently monitored for the following issues for this low-risk pregnancy and has Idiopathic scoliosis; Low back pain; Neuropathic pain; and Supervision of other normal pregnancy, antepartum on their problem list.  Patient reports low back pain, requests pregnancy belt.  Contractions: Not present. Vag. Bleeding: None.  Movement: Present. Denies leaking of fluid.   The following portions of the patient's history were reviewed and updated as appropriate: allergies, current medications, past family history, past medical history, past social history, past surgical history and problem list. Problem list updated.  Objective:   Vitals:   06/08/18 1531  BP: 113/70  Pulse: (!) 112  Weight: 140 lb 1.6 oz (63.5 kg)    Fetal Status: Fetal Heart Rate (bpm): 156   Movement: Present     General:  Alert, oriented and cooperative. Patient is in no acute distress.  Skin: Skin is warm and dry. No rash noted.   Cardiovascular: Normal heart rate noted  Respiratory: Normal respiratory effort, no problems with respiration noted  Abdomen: Soft, gravid, appropriate for gestational age.  Pain/Pressure: Absent     Pelvic: Cervical exam deferred        Extremities: Normal range of motion.  Edema: None  Mental Status: Normal mood and affect. Normal behavior. Normal judgment and thought content.   Assessment and Plan:  Pregnancy: G2P1001 at [redacted]w[redacted]d  1. Supervision of other normal pregnancy, antepartum -Continue routine PNC  -Reviewed lab results from initial prenatal visit  - Prenatal Vit-Fe Fumarate-FA (PREPLUS) 27-1 MG TABS; Take 1 tablet by mouth daily.  Dispense: 30 tablet; Refill: 3 (patient reports Rx sent at last visit was not available for pick up) -Anatomy ultrasound scheduled   2. Low back pain during pregnancy in second trimester - Elastic Bandages & Supports  (COMFORT FIT MATERNITY SUPP LG) MISC; 1 Device by Does not apply route daily as needed.  Dispense: 1 each; Refill: 0   There are no diagnoses linked to this encounter. Preterm labor symptoms and general obstetric precautions including but not limited to vaginal bleeding, contractions, leaking of fluid and fetal movement were reviewed in detail with the patient. Please refer to After Visit Summary for other counseling recommendations.  Return in about 4 weeks (around 07/06/2018) for routine PNC.  Future Appointments  Date Time Provider Department Center  06/24/2018  8:15 AM Allie Bossier, MD [facility]  06/30/2018 10:15 AM [facility] [place] 4 [facility]    De Hollingshead, DO

## 2018-06-08 NOTE — Patient Instructions (Signed)

## 2018-06-24 ENCOUNTER — Other Ambulatory Visit: Payer: Self-pay

## 2018-06-24 ENCOUNTER — Ambulatory Visit (INDEPENDENT_AMBULATORY_CARE_PROVIDER_SITE_OTHER): Payer: Medicaid Other | Admitting: Obstetrics & Gynecology

## 2018-06-24 VITALS — BP 118/80 | HR 98 | Wt 143.0 lb

## 2018-06-24 DIAGNOSIS — Z3482 Encounter for supervision of other normal pregnancy, second trimester: Secondary | ICD-10-CM | POA: Diagnosis not present

## 2018-06-24 DIAGNOSIS — Z3A18 18 weeks gestation of pregnancy: Secondary | ICD-10-CM | POA: Diagnosis not present

## 2018-06-24 DIAGNOSIS — Z3480 Encounter for supervision of other normal pregnancy, unspecified trimester: Secondary | ICD-10-CM

## 2018-06-24 NOTE — Progress Notes (Signed)
Home Medicaid Form completed  

## 2018-06-24 NOTE — Progress Notes (Signed)
   PRENATAL VISIT NOTE  Subjective:  Kelsey Davies is a 28 y.o. G2P1001 at [redacted]w[redacted]d being seen today for ongoing prenatal care.  She is currently monitored for the following issues for this low-risk pregnancy and has Idiopathic scoliosis; Low back pain; Neuropathic pain; and Supervision of other normal pregnancy, antepartum on their problem list.  Patient reports no complaints.  Contractions: Not present. Vag. Bleeding: None.  Movement: Absent. Denies leaking of fluid.   The following portions of the patient's history were reviewed and updated as appropriate: allergies, current medications, past family history, past medical history, past social history, past surgical history and problem list. Problem list updated.  Objective:   Vitals:   06/24/18 0845  BP: 118/80  Pulse: 98  Weight: 143 lb (64.9 kg)    Fetal Status: Fetal Heart Rate (bpm): 147   Movement: Absent     General:  Alert, oriented and cooperative. Patient is in no acute distress.  Skin: Skin is warm and dry. No rash noted.   Cardiovascular: Normal heart rate noted  Respiratory: Normal respiratory effort, no problems with respiration noted  Abdomen: Soft, gravid, appropriate for gestational age.  Pain/Pressure: Absent     Pelvic: Cervical exam deferred        Extremities: Normal range of motion.  Edema: None  Mental Status: Normal mood and affect. Normal behavior. Normal judgment and thought content.   Assessment and Plan:  Pregnancy: G2P1001 at [redacted]w[redacted]d  1. Supervision of other normal pregnancy, antepartum - Rec limit her weight gain - Quad screen today  Preterm labor symptoms and general obstetric precautions including but not limited to vaginal bleeding, contractions, leaking of fluid and fetal movement were reviewed in detail with the patient. Please refer to After Visit Summary for other counseling recommendations.  Return in about 4 weeks (around 07/22/2018).  Future Appointments  Date Time Provider Department  Center  06/30/2018 10:15 AM [facility] [place] 4 [facility]    Allie Bossier, MD

## 2018-06-26 LAB — AFP TETRA
DIA MOM VALUE: 1.13
DIA Value (EIA): 193.66 pg/mL
DSR (By Age)    1 IN: 987
DSR (Second Trimester) 1 IN: 2894
Gestational Age: 18.1 WEEKS
MSAFP Mom: 0.72
MSAFP: 32.4 ng/mL
MSHCG Mom: 1.03
MSHCG: 29889 m[IU]/mL
Maternal Age At EDD: 25.9 yr
Osb Risk: 10000
T18 (By Age): 1:3847 {titer}
Test Results:: NEGATIVE
WEIGHT: 143 [lb_av]
uE3 Mom: 1.14
uE3 Value: 1.59 ng/mL

## 2018-06-30 ENCOUNTER — Other Ambulatory Visit (HOSPITAL_COMMUNITY): Payer: Self-pay | Admitting: *Deleted

## 2018-06-30 ENCOUNTER — Ambulatory Visit (HOSPITAL_COMMUNITY)
Admission: RE | Admit: 2018-06-30 | Discharge: 2018-06-30 | Disposition: A | Discharge status: Home / Self Care | Payer: Medicaid Other | Source: Ambulatory Visit | Attending: Student | Admitting: Student

## 2018-06-30 DIAGNOSIS — Z3A19 19 weeks gestation of pregnancy: Secondary | ICD-10-CM | POA: Diagnosis not present

## 2018-06-30 DIAGNOSIS — Z363 Encounter for antenatal screening for malformations: Secondary | ICD-10-CM

## 2018-06-30 DIAGNOSIS — Z3480 Encounter for supervision of other normal pregnancy, unspecified trimester: Secondary | ICD-10-CM | POA: Insufficient documentation

## 2018-06-30 DIAGNOSIS — Z362 Encounter for other antenatal screening follow-up: Secondary | ICD-10-CM

## 2018-07-14 ENCOUNTER — Encounter: Payer: Self-pay | Admitting: *Deleted

## 2018-07-17 ENCOUNTER — Telehealth: Payer: Self-pay

## 2018-07-17 NOTE — Telephone Encounter (Signed)
Called pt using Nepali Interpreter id # [id number] from [facility] Interpreter to advise pt of new check in process due to COVID-19, Non answer, left VM.

## 2018-07-22 ENCOUNTER — Other Ambulatory Visit: Payer: Self-pay

## 2018-07-22 ENCOUNTER — Ambulatory Visit (INDEPENDENT_AMBULATORY_CARE_PROVIDER_SITE_OTHER): Payer: Medicaid Other | Admitting: Nurse Practitioner

## 2018-07-22 VITALS — BP 99/67 | HR 106 | Temp 98.1°F | Wt 144.4 lb

## 2018-07-22 DIAGNOSIS — Z3480 Encounter for supervision of other normal pregnancy, unspecified trimester: Secondary | ICD-10-CM

## 2018-07-22 DIAGNOSIS — Z3482 Encounter for supervision of other normal pregnancy, second trimester: Secondary | ICD-10-CM

## 2018-07-22 DIAGNOSIS — Z3A22 22 weeks gestation of pregnancy: Secondary | ICD-10-CM

## 2018-07-22 NOTE — Progress Notes (Signed)
    Subjective:  Kelsey Davies is a 28 y.o. G2P1001 at [redacted]w[redacted]d being seen today for ongoing prenatal care.  She is currently monitored for the following issues for this low-risk pregnancy and has Idiopathic scoliosis; Low back pain; Neuropathic pain; and Supervision of other normal pregnancy, antepartum on their problem list.  Patient reports no complaints.  Contractions: Not present. Vag. Bleeding: None.  Movement: Present. Denies leaking of fluid.   The following portions of the patient's history were reviewed and updated as appropriate: allergies, current medications, past family history, past medical history, past social history, past surgical history and problem list. Problem list updated.  Objective:   Vitals:   07/22/18 0923  BP: 99/67  Pulse: (!) 106  Temp: 98.1 F (36.7 C)  Weight: 144 lb 6.4 oz (65.5 kg)    Fetal Status: Fetal Heart Rate (bpm): 150 Fundal Height: 22 cm Movement: Present     General:  Alert, oriented and cooperative. Patient is in no acute distress.  Skin: Skin is warm and dry. No rash noted.   Cardiovascular: Normal heart rate noted  Respiratory: Normal respiratory effort, no problems with respiration noted  Abdomen: Soft, gravid, appropriate for gestational age. Pain/Pressure: Present     Pelvic:  Cervical exam deferred        Extremities: Normal range of motion.  Edema: None  Mental Status: Normal mood and affect. Normal behavior. Normal judgment and thought content.   Urinalysis:      Assessment and Plan:  Pregnancy: G2P1001 at [redacted]w[redacted]d  1. Supervision of other normal pregnancy, antepartum BP cuff given and client shown how to make it work - record readings weekly - be prepared to show the recordings while doing a televisit - client has facetime on her phone. Also has an appointment in MFM on 07-29-18 and is aware of that appointment. Has someone in her household that speaks [place].  Is aware of Covid19 precautions on the news and has no questions.   Reports she is staying safe at home.3 weeks and   2.  Language barrier Interpreter on phone - Nepali  Preterm labor symptoms and general obstetric precautions including but not limited to vaginal bleeding, contractions, leaking of fluid and fetal movement were reviewed in detail with the patient. Please refer to After Visit Summary for other counseling recommendations.  Return in about 3 weeks (around 08/12/2018). for a phone visit. And again at 28 weeks in person for labs and visit.  Nolene Bernheim, RN, MSN, NP-BC Nurse Practitioner, [facility] for [employer], [facility] 07/22/2018 10:09 AM

## 2018-07-29 ENCOUNTER — Encounter (HOSPITAL_COMMUNITY): Payer: Self-pay

## 2018-07-29 ENCOUNTER — Ambulatory Visit (HOSPITAL_COMMUNITY): Payer: Medicaid Other

## 2018-07-29 ENCOUNTER — Ambulatory Visit (HOSPITAL_COMMUNITY): Payer: Medicaid Other | Attending: Obstetrics and Gynecology

## 2018-08-11 ENCOUNTER — Other Ambulatory Visit: Payer: Self-pay

## 2018-08-11 ENCOUNTER — Telehealth: Payer: Self-pay | Admitting: Obstetrics and Gynecology

## 2018-08-11 ENCOUNTER — Ambulatory Visit (INDEPENDENT_AMBULATORY_CARE_PROVIDER_SITE_OTHER): Payer: Medicaid Other | Admitting: Student

## 2018-08-11 DIAGNOSIS — Z3A25 25 weeks gestation of pregnancy: Secondary | ICD-10-CM | POA: Diagnosis not present

## 2018-08-11 DIAGNOSIS — Z3480 Encounter for supervision of other normal pregnancy, unspecified trimester: Secondary | ICD-10-CM

## 2018-08-11 DIAGNOSIS — Z3482 Encounter for supervision of other normal pregnancy, second trimester: Secondary | ICD-10-CM | POA: Diagnosis not present

## 2018-08-11 NOTE — Telephone Encounter (Signed)
The patient stated she could not download the app. She is experiencing issues, informed the patient the nurse/doctor will call her instead.

## 2018-08-11 NOTE — Telephone Encounter (Signed)
Called the patient to inform of the virtual meeting. Walked the patient through how to download the [employer]. The patient verbalized understanding.

## 2018-08-12 NOTE — Progress Notes (Signed)
   PRENATAL VISIT NOTE    TELEHEALTH VIRTUAL OBSTETRICS VISIT ENCOUNTER NOTE  CMA Neal connected with Kelsey Davies on 08/12/18 at  3:55 PM EDT by telephone at home and verified that she was speaking with the correct person using two identifiers. Over the phone interpreter was used for the duration of this visit.    I discussed the limitations, risks, security and privacy concerns of performing an evaluation and management service by telephone and the availability of in person appointments. I also discussed with the patient that there may be a patient responsible charge related to this service. The patient expressed understanding and agreed to proceed.  Subjective:  Kelsey Davies is a 28 y.o. G2P1001 at [redacted]w[redacted]d being followed for ongoing prenatal care.  She is currently monitored for the following issues for this low-risk pregnancy and has Idiopathic scoliosis; Low back pain; Neuropathic pain; and Supervision of other normal pregnancy, antepartum on their problem list.  Patient reports no complaints. Reports fetal movement. Denies any contractions, bleeding or leaking of fluid.   The following portions of the patient's history were reviewed and updated as appropriate: allergies, current medications, past family history, past medical history, past social history, past surgical history and problem list.   Objective:   General:  Alert, oriented and cooperative.   Mental Status: Normal mood and affect perceived. Normal judgment and thought content.  Rest of physical exam deferred due to type of encounter  Assessment and Plan:  Pregnancy: G2P1001 at [redacted]w[redacted]d 1. Supervision of other normal pregnancy, antepartum -Feeling strong fetal movements.  -Explained process for 2 hour GTT at next visit. -Patient has BP cuff; will continue to take BP weekly. BP by patient report today was 117/77.  -[place] has been rescheduled; patient knows new date.   Preterm labor symptoms and general obstetric  precautions including but not limited to vaginal bleeding, contractions, leaking of fluid and fetal movement were reviewed in detail with the patient.  I discussed the assessment and treatment plan with the patient. The patient was provided an opportunity to ask questions and all were answered. The patient agreed with the plan and demonstrated an understanding of the instructions. The patient was advised to call back or seek an in-person office evaluation/go to MAU at [facility] for any urgent or concerning symptoms. Please refer to After Visit Summary for other counseling recommendations.   I provided 20 minutes of non-face-to-face time during this encounter.  Return in about 3 weeks (around 09/01/2018), or LROB for 2 hour GTT.  Future Appointments  Date Time Provider Department Center  08/25/2018  9:00 AM WH-MFC NURSE WH-MFC MFC-US  08/25/2018  9:00 AM WH-MFC [place] 3 WH-MFCUS MFC-US  09/01/2018  8:20 AM WOC-WOCA LAB WOC-WOCA WOC  09/01/2018 10:55 AM Kooistra, Charlesetta Garibaldi, CNM WOC-WOCA WOC    Marylene Land, CNM Center for [employer], [facility]

## 2018-08-25 ENCOUNTER — Other Ambulatory Visit: Payer: Self-pay

## 2018-08-25 ENCOUNTER — Ambulatory Visit (HOSPITAL_COMMUNITY): Payer: Medicaid Other

## 2018-08-25 ENCOUNTER — Ambulatory Visit (HOSPITAL_COMMUNITY)
Admission: RE | Admit: 2018-08-25 | Discharge: 2018-08-25 | Disposition: A | Discharge status: Home / Self Care | Payer: Medicaid Other | Source: Ambulatory Visit | Attending: Maternal & Fetal Medicine | Admitting: Maternal & Fetal Medicine

## 2018-08-25 ENCOUNTER — Other Ambulatory Visit (HOSPITAL_COMMUNITY): Payer: Self-pay | Admitting: Maternal & Fetal Medicine

## 2018-08-25 DIAGNOSIS — Z362 Encounter for other antenatal screening follow-up: Secondary | ICD-10-CM | POA: Diagnosis present

## 2018-08-25 DIAGNOSIS — O26842 Uterine size-date discrepancy, second trimester: Secondary | ICD-10-CM

## 2018-08-25 DIAGNOSIS — Z3A27 27 weeks gestation of pregnancy: Secondary | ICD-10-CM

## 2018-08-26 ENCOUNTER — Other Ambulatory Visit (HOSPITAL_COMMUNITY): Payer: Self-pay | Admitting: *Deleted

## 2018-08-26 DIAGNOSIS — Z362 Encounter for other antenatal screening follow-up: Secondary | ICD-10-CM

## 2018-08-31 ENCOUNTER — Telehealth: Payer: Self-pay | Admitting: Obstetrics & Gynecology

## 2018-08-31 NOTE — Telephone Encounter (Signed)
Called the patient to confirm the appointment, the patient verbalized understating. °

## 2018-09-01 ENCOUNTER — Other Ambulatory Visit: Payer: Medicaid Other

## 2018-09-01 ENCOUNTER — Ambulatory Visit (INDEPENDENT_AMBULATORY_CARE_PROVIDER_SITE_OTHER): Payer: Medicaid Other | Admitting: Student

## 2018-09-01 ENCOUNTER — Other Ambulatory Visit: Payer: Self-pay

## 2018-09-01 VITALS — BP 114/78 | HR 105 | Temp 97.8°F | Wt 145.0 lb

## 2018-09-01 DIAGNOSIS — Z3A28 28 weeks gestation of pregnancy: Secondary | ICD-10-CM

## 2018-09-01 DIAGNOSIS — Z23 Encounter for immunization: Secondary | ICD-10-CM | POA: Diagnosis not present

## 2018-09-01 DIAGNOSIS — Z3483 Encounter for supervision of other normal pregnancy, third trimester: Secondary | ICD-10-CM | POA: Diagnosis not present

## 2018-09-01 DIAGNOSIS — Z3480 Encounter for supervision of other normal pregnancy, unspecified trimester: Secondary | ICD-10-CM

## 2018-09-01 NOTE — Progress Notes (Signed)
     PRENATAL VISIT NOTE  Subjective:  Kelsey Davies is a 28 y.o. G2P1001 at [redacted]w[redacted]d being seen today for ongoing prenatal care.  She is currently monitored for the following issues for this low-risk pregnancy and has Idiopathic scoliosis; Low back pain; Neuropathic pain; and Supervision of other normal pregnancy, antepartum on their problem list.  Patient reports no complaints. She has been taking her BP at home and writing it on paper but has not brought paper. She says that the top numbers have been 118/120 over 89 and 72.  Contractions: Not present. Vag. Bleeding: None.  Movement: Present. Denies leaking of fluid.   The following portions of the patient's history were reviewed and updated as appropriate: allergies, current medications, past family history, past medical history, past social history, past surgical history and problem list.   Objective:   Vitals:   09/01/18 1000  BP: 114/78  Pulse: (!) 105  Temp: 97.8 F (36.6 C)  Weight: 145 lb (65.8 kg)    Fetal Status: Fetal Heart Rate (bpm): 152 Fundal Height: 29 cm Movement: Present     General:  Alert, oriented and cooperative. Patient is in no acute distress.  Skin: Skin is warm and dry. No rash noted.   Cardiovascular: Normal heart rate noted  Respiratory: Normal respiratory effort, no problems with respiration noted  Abdomen: Soft, gravid, appropriate for gestational age.  Pain/Pressure: Present     Pelvic: Cervical exam deferred        Extremities: Normal range of motion.  Edema: None  Mental Status: Normal mood and affect. Normal behavior. Normal judgment and thought content.   Assessment and Plan:  Pregnancy: G2P1001 at [redacted]w[redacted]d 1. Supervision of other normal pregnancy, antepartum -2 hour GTT in process -Patient knows how to use Webex; showed her where to log in. Reviewed warning signs for BP and when to come to MAU. Gave her the address to the hospital.  -She showed me in her phone where she will put her BPs (Notes  app) so that she doesn't have to write on paper and forget it.  - Tdap vaccine greater than or equal to 7yo IM -Confirmed Depo for [facility] Preterm labor symptoms and general obstetric precautions including but not limited to vaginal bleeding, contractions, leaking of fluid and fetal movement were reviewed in detail with the patient. Please refer to After Visit Summary for other counseling recommendations.   Return in about 2 weeks (around 09/15/2018), or LROB on webex.  Future Appointments  Date Time Provider Department Center  09/01/2018 10:55 AM Marylene Land, CNM WOC-WOCA WOC  09/18/2018  8:55 AM Allie Bossier, MD [facility] WOC  09/29/2018 10:15 AM WH-MFC [place] 4 WH-MFCUS MFC-US    Marylene Land, CNM

## 2018-09-01 NOTE — Patient Instructions (Addendum)
Entrance for the [facility]:   [address] [place], [place], [place] [address]

## 2018-09-02 LAB — GLUCOSE TOLERANCE, 2 HOURS W/ 1HR
Glucose, 1 hour: 171 mg/dL (ref 65–179)
Glucose, 2 hour: 89 mg/dL (ref 65–152)
Glucose, Fasting: 89 mg/dL (ref 65–91)

## 2018-09-02 LAB — RPR: RPR Ser Ql: NONREACTIVE

## 2018-09-02 LAB — HIV ANTIBODY (ROUTINE TESTING W REFLEX): HIV Screen 4th Generation wRfx: NONREACTIVE

## 2018-09-02 LAB — CBC
Hematocrit: 40.2 % (ref 34.0–46.6)
Hemoglobin: 13.2 g/dL (ref 11.1–15.9)
MCH: 27.7 pg (ref 26.6–33.0)
MCHC: 32.8 g/dL (ref 31.5–35.7)
MCV: 84 fL (ref 79–97)
Platelets: 344 10*3/uL (ref 150–450)
RBC: 4.77 x10E6/uL (ref 3.77–5.28)
RDW: 14.3 % (ref 11.7–15.4)
WBC: 12.5 10*3/uL — ABNORMAL HIGH (ref 3.4–10.8)

## 2018-09-09 ENCOUNTER — Other Ambulatory Visit: Payer: Self-pay

## 2018-09-09 ENCOUNTER — Encounter (HOSPITAL_COMMUNITY): Payer: Self-pay

## 2018-09-09 ENCOUNTER — Ambulatory Visit (HOSPITAL_COMMUNITY)
Admission: EM | Admit: 2018-09-09 | Discharge: 2018-09-09 | Disposition: A | Discharge status: Home / Self Care | Payer: Medicaid Other | Attending: Family Medicine | Admitting: Family Medicine

## 2018-09-09 DIAGNOSIS — R21 Rash and other nonspecific skin eruption: Secondary | ICD-10-CM | POA: Diagnosis not present

## 2018-09-09 MED ORDER — TRIAMCINOLONE ACETONIDE 0.1 % EX CREA: 1.0000 "application " | TOPICAL_CREAM | Freq: Two times a day (BID) | CUTANEOUS | 0 refills | Status: DC

## 2018-09-09 NOTE — Discharge Instructions (Signed)
Please use triamcinolone cream twice daily and thin amount to areas on wrists and thigh, this should help with itching and swelling  I Expect this to gradually resolve on its own in time  They also apply ice to help with any swelling  Follow-up if rash changing, spreading, worsening, developing fevers, chills, headaches with this.

## 2018-09-09 NOTE — ED Triage Notes (Signed)
Patient presents to Urgent Care with complaints of itchy rash on bilateral arms and right upper thigh since 3-4 days ago. Patient reports she put a poison ivy cream on it but that did not help. Pt is [redacted] weeks pregnant.

## 2018-09-09 NOTE — ED Provider Notes (Signed)
[facility]    CSN: [id number] Arrival date & time: 09/09/18  1039     History   Chief Complaint Chief Complaint  Patient presents with  . Rash    HPI Kelsey Davies is a 28 y.o. female approximately 3927 weeks pregnant presenting today for evaluation of a rash.  Patient has had a rash to bilateral forearms as well as left proximal thigh for the past 3 to 4 days.  Has had associated itching associated with it.  Denies symptoms spreading.  Denies associated URI symptoms.  Denies fevers, chills or headache.  Denies any known exposure to poison ivy or known bug bites.  Denies history of similar.  She has tried to poison ivy cream with minimal relief.  Denies any new soaps, lotions, detergents or hygiene products.  Denies any new medicines.  HPI  Past Medical History:  Diagnosis Date  . Medical history non-contributory     Patient Active Problem List   Diagnosis Date Noted  . Supervision of other normal pregnancy, antepartum 05/11/2018  . Low back pain 08/12/2013  . Neuropathic pain 08/12/2013  . Idiopathic scoliosis 09/25/2012    Past Surgical History:  Procedure Laterality Date  . NO PAST SURGERIES      OB History    Gravida  2   Para  1   Term  1   Preterm      AB      Living  1     SAB      TAB      Ectopic      Multiple      Live Births  1            Home Medications    Prior to Admission medications   Medication Sig Start Date End Date Taking? Authorizing Provider  docusate sodium (COLACE) 100 MG capsule Take 1 capsule (100 mg total) by mouth 2 (two) times daily. Patient not taking: Reported on 08/11/2018 05/11/18   Judeth HornLawrence, Erin, NP  Elastic Bandages & Supports (COMFORT FIT MATERNITY SUPP LG) MISC 1 Device by Does not apply route daily as needed. 06/08/18   Arvilla MarketWallace, Catherine Lauren, DO  Prenatal Vit-Fe Fumarate-FA (PREPLUS) 27-1 MG TABS Take 1 tablet by mouth daily. 06/08/18   Arvilla MarketWallace, Catherine Lauren, DO  triamcinolone cream  (KENALOG) 0.1 % Apply 1 application topically 2 (two) times daily. 09/09/18   Wieters, Junius CreamerHallie C, PA-C    Family History Family History  Problem Relation Age of Onset  . Healthy Mother   . Rheum arthritis Maternal Grandmother   . Rheum arthritis Maternal Grandfather   . Asthma Paternal Grandmother   . Rheum arthritis Sister   . Rheum arthritis Brother   . Hearing loss Neg Hx     Social History Social History   Tobacco Use  . Smoking status: Never Smoker  . Smokeless tobacco: Never Used  Substance Use Topics  . Alcohol use: No  . Drug use: No     Allergies   Patient has no known allergies.   Review of Systems Review of Systems  Constitutional: Negative for fatigue and fever.  HENT: Negative for mouth sores.   Eyes: Negative for visual disturbance.  Respiratory: Negative for shortness of breath.   Cardiovascular: Negative for chest pain.  Gastrointestinal: Negative for abdominal pain, nausea and vomiting.  Genitourinary: Negative for genital sores.  Musculoskeletal: Negative for arthralgias and joint swelling.  Skin: Positive for rash. Negative for color change and wound.  Neurological: Negative  for dizziness, weakness, light-headedness and headaches.     Physical Exam Triage Vital Signs ED Triage Vitals  Enc Vitals Group     BP 09/09/18 1100 112/67     Pulse Rate 09/09/18 1100 (!) 101     Resp 09/09/18 1100 18     Temp 09/09/18 1100 98.1 F (36.7 C)     Temp Source 09/09/18 1100 Oral     SpO2 09/09/18 1100 100 %     Weight --      Height --      Head Circumference --      Peak Flow --      Pain Score 09/09/18 1059 4     Pain Loc --      Pain Edu? --      Excl. in GC? --    No data found.  Updated Vital Signs BP 112/67 (BP Location: Right Arm)   Pulse (!) 101   Temp 98.1 F (36.7 C) (Oral)   Resp 18   LMP 02/17/2018 (Exact Date)   SpO2 100%   Visual Acuity Right Eye Distance:   Left Eye Distance:   Bilateral Distance:    Right Eye Near:    Left Eye Near:    Bilateral Near:     Physical Exam Vitals signs and nursing note reviewed.  Constitutional:      Appearance: She is well-developed.     Comments: No acute distress  HENT:     Head: Normocephalic and atraumatic.     Nose: Nose normal.     Mouth/Throat:     Comments: Oral mucosa pink and moist, no tonsillar enlargement or exudate. Posterior pharynx patent and nonerythematous, no uvula deviation or swelling. Normal phonation. No lesions on buccal mucosa Eyes:     Conjunctiva/sclera: Conjunctivae normal.  Neck:     Musculoskeletal: Neck supple.  Cardiovascular:     Rate and Rhythm: Normal rate.  Pulmonary:     Effort: Pulmonary effort is normal. No respiratory distress.     Comments: Breathing comfortably at rest, CTABL, no wheezing, rales or other adventitious sounds auscultated Abdominal:     General: There is no distension.  Musculoskeletal: Normal range of motion.  Skin:    General: Skin is warm and dry.     Comments: See pictures below; bilateral forearms with multiple clustered erythematous papular lesions, occasional scabbing from excoriation to distal forearms, right proximal anterior thigh with maculopapular area of erythema  Neurological:     Mental Status: She is alert and oriented to person, place, and time.          UC Treatments / Results  Labs (all labs ordered are listed, but only abnormal results are displayed) Labs Reviewed - No data to display  EKG None  Radiology No results found.  Procedures Procedures (including critical care time)  Medications Ordered in UC Medications - No data to display  Initial Impression / Assessment and Plan / UC Course  I have reviewed the triage vital signs and the nursing notes.  Pertinent labs & imaging results that were available during my care of the patient were reviewed by me and considered in my medical decision making (see chart for details).     [redacted] weeks pregnant, rash localized to  distal forearms as well as proximal thigh.  Associated itching.  Possible bug bite versus other allergic reaction.  Will provide triamcinolone cream to apply topically twice daily and thin amount.  Advised not to use more frequently than this,  and to limit changes in skin color as well as limit systemic absorption.  I would expect self resolution over time.  Avoid itching.Discussed strict return precautions. Patient verbalized understanding and is agreeable with plan.  Final Clinical Impressions(s) / UC Diagnoses   Final diagnoses:  Rash and nonspecific skin eruption     Discharge Instructions     Please use triamcinolone cream twice daily and thin amount to areas on wrists and thigh, this should help with itching and swelling  I Expect this to gradually resolve on its own in time  They also apply ice to help with any swelling  Follow-up if rash changing, spreading, worsening, developing fevers, chills, headaches with this.   ED Prescriptions    Medication Sig Dispense Auth. Provider   triamcinolone cream (KENALOG) 0.1 % Apply 1 application topically 2 (two) times daily. 30 g Wieters, Hampton C, PA-C     Controlled Substance Prescriptions [place] Controlled Substance Registry consulted? Not Applicable   Lew Dawes, [place] 09/09/18 1116

## 2018-09-18 ENCOUNTER — Other Ambulatory Visit: Payer: Self-pay

## 2018-09-18 ENCOUNTER — Ambulatory Visit (INDEPENDENT_AMBULATORY_CARE_PROVIDER_SITE_OTHER): Payer: Medicaid Other | Admitting: Obstetrics & Gynecology

## 2018-09-18 VITALS — BP 100/66 | HR 107

## 2018-09-18 DIAGNOSIS — Z3483 Encounter for supervision of other normal pregnancy, third trimester: Secondary | ICD-10-CM | POA: Diagnosis not present

## 2018-09-18 DIAGNOSIS — Z3480 Encounter for supervision of other normal pregnancy, unspecified trimester: Secondary | ICD-10-CM

## 2018-09-18 DIAGNOSIS — Z3A30 30 weeks gestation of pregnancy: Secondary | ICD-10-CM | POA: Diagnosis not present

## 2018-09-18 NOTE — Progress Notes (Signed)
   TELEHEALTH VIRTUAL OBSTETRICS VISIT ENCOUNTER NOTE  I connected with Kelsey Davies on 09/18/18 at  8:55 AM EDT by telephone at home and verified that I am speaking with the correct person using two identifiers.   I discussed the limitations, risks, security and privacy concerns of performing an evaluation and management service by telephone and the availability of in person appointments. I also discussed with the patient that there may be a patient responsible charge related to this service. The patient expressed understanding and agreed to proceed.  Subjective:  Kelsey Davies is a 28 y.o. G2P1001 at [redacted]w[redacted]d being followed for ongoing prenatal care.  She is currently monitored for the following issues for this low-risk pregnancy and has Idiopathic scoliosis; Low back pain; Neuropathic pain; and Supervision of other normal pregnancy, antepartum on their problem list.  Patient reports no complaints. Reports fetal movement. Denies any contractions, bleeding or leaking of fluid.   The following portions of the patient's history were reviewed and updated as appropriate: allergies, current medications, past family history, past medical history, past social history, past surgical history and problem list.   Objective:   General:  Alert, oriented and cooperative.   Mental Status: Normal mood and affect perceived. Normal judgment and thought content.  Rest of physical exam deferred due to type of encounter  Assessment and Plan:  Pregnancy: G2P1001 at [redacted]w[redacted]d There are no diagnoses linked to this encounter. Preterm labor symptoms and general obstetric precautions including but not limited to vaginal bleeding, contractions, leaking of fluid and fetal movement were reviewed in detail with the patient.  I discussed the assessment and treatment plan with the patient. The patient was provided an opportunity to ask questions and all were answered. The patient agreed with the plan and demonstrated an  understanding of the instructions. The patient was advised to call back or seek an in-person office evaluation/go to MAU at [facility] for any urgent or concerning symptoms. Please refer to After Visit Summary for other counseling recommendations.   I provided 10 minutes of non-face-to-face time during this encounter.  No follow-ups on file.  Future Appointments  Date Time Provider Department Center  09/29/2018 10:15 AM WH-MFC [place] 4 WH-MFCUS MFC-US    Allie Bossier, MD Center for [employer], [facility]

## 2018-09-18 NOTE — Progress Notes (Signed)
I connected with  Kelsey Davies on 09/18/18 at  8:55 AM EDT by telephone and verified that I am speaking with the correct person using two identifiers.   I discussed the limitations, risks, security and privacy concerns of performing an evaluation and management service by telephone and the availability of in person appointments. I also discussed with the patient that there may be a patient responsible charge related to this service. The patient expressed understanding and agreed to proceed.  [name], CMA 09/18/2018  9:05 AM

## 2018-09-29 ENCOUNTER — Ambulatory Visit (HOSPITAL_COMMUNITY): Payer: Medicaid Other | Attending: Obstetrics and Gynecology

## 2018-09-29 ENCOUNTER — Encounter (HOSPITAL_COMMUNITY): Payer: Self-pay

## 2018-09-30 ENCOUNTER — Telehealth: Payer: Self-pay

## 2018-09-30 NOTE — Telephone Encounter (Signed)
Called the patient to inform of the upcoming appointment. Stated she has the [employer].

## 2018-10-02 ENCOUNTER — Ambulatory Visit (INDEPENDENT_AMBULATORY_CARE_PROVIDER_SITE_OTHER): Payer: Medicaid Other | Admitting: Medical

## 2018-10-02 ENCOUNTER — Other Ambulatory Visit: Payer: Self-pay

## 2018-10-02 ENCOUNTER — Encounter: Payer: Self-pay | Admitting: Medical

## 2018-10-02 VITALS — BP 99/76 | HR 95

## 2018-10-02 DIAGNOSIS — Z3483 Encounter for supervision of other normal pregnancy, third trimester: Secondary | ICD-10-CM

## 2018-10-02 DIAGNOSIS — Z3480 Encounter for supervision of other normal pregnancy, unspecified trimester: Secondary | ICD-10-CM

## 2018-10-02 DIAGNOSIS — Z3A32 32 weeks gestation of pregnancy: Secondary | ICD-10-CM

## 2018-10-02 NOTE — Patient Instructions (Signed)
Fetal Movement Counts Patient Name: ________________________________________________ Patient Due Date: ____________________ What is a fetal movement count?  A fetal movement count is the number of times that you feel your baby move during a certain amount of time. This may also be called a fetal kick count. A fetal movement count is recommended for every pregnant woman. You may be asked to start counting fetal movements as early as week 28 of your pregnancy. Pay attention to when your baby is most active. You may notice your baby's sleep and wake cycles. You may also notice things that make your baby move more. You should do a fetal movement count:  When your baby is normally most active.  At the same time each day. A good time to count movements is while you are resting, after having something to eat and drink. How do I count fetal movements? 1. Find a quiet, comfortable area. Sit, or lie down on your side. 2. Write down the date, the start time and stop time, and the number of movements that you felt between those two times. Take this information with you to your health care visits. 3. For 2 hours, count kicks, flutters, swishes, rolls, and jabs. You should feel at least 10 movements during 2 hours. 4. You may stop counting after you have felt 10 movements. 5. If you do not feel 10 movements in 2 hours, have something to eat and drink. Then, keep resting and counting for 1 hour. If you feel at least 4 movements during that hour, you may stop counting. Contact a health care provider if:  You feel fewer than 4 movements in 2 hours.  Your baby is not moving like he or she usually does. Date: ____________ Start time: ____________ Stop time: ____________ Movements: ____________ Date: ____________ Start time: ____________ Stop time: ____________ Movements: ____________ Date: ____________ Start time: ____________ Stop time: ____________ Movements: ____________ Date: ____________ Start time:  ____________ Stop time: ____________ Movements: ____________ Date: ____________ Start time: ____________ Stop time: ____________ Movements: ____________ Date: ____________ Start time: ____________ Stop time: ____________ Movements: ____________ Date: ____________ Start time: ____________ Stop time: ____________ Movements: ____________ Date: ____________ Start time: ____________ Stop time: ____________ Movements: ____________ Date: ____________ Start time: ____________ Stop time: ____________ Movements: ____________ This information is not intended to replace advice given to you by your health care provider. Make sure you discuss any questions you have with your health care provider. Document Released: 05/08/2006 Document Revised: 12/06/2015 Document Reviewed: 05/18/2015 Elsevier Interactive Patient Education  2019 Else[employer] Braxton Hicks Contractions Contractions of the uterus can occur throughout pregnancy, but they are not always a sign that you are in labor. You may have practice contractions called Braxton Hicks contractions. These false labor contractions are sometimes confused with true labor. What are Braxton Hicks contractions? Braxton Hicks contractions are tightening movements that occur in the muscles of the uterus before labor. Unlike true labor contractions, these contractions do not result in opening (dilation) and thinning of the cervix. Toward the end of pregnancy (32-34 weeks), Braxton Hicks contractions can happen more often and may become stronger. These contractions are sometimes difficult to tell apart from true labor because they can be very uncomfortable. You should not feel embarrassed if you go to the hospital with false labor. Sometimes, the only way to tell if you are in true labor is for your health care provider to look for changes in the cervix. The health care provider will do a physical exam and may monitor your contractions. If   you are not in true labor, the exam  should show that your cervix is not dilating and your water has not broken. If there are no other health problems associated with your pregnancy, it is completely safe for you to be sent home with false labor. You may continue to have Braxton Hicks contractions until you go into true labor. How to tell the difference between true labor and false labor True labor  Contractions last 30-70 seconds.  Contractions become very regular.  Discomfort is usually felt in the top of the uterus, and it spreads to the lower abdomen and low back.  Contractions do not go away with walking.  Contractions usually become more intense and increase in frequency.  The cervix dilates and gets thinner. False labor  Contractions are usually shorter and not as strong as true labor contractions.  Contractions are usually irregular.  Contractions are often felt in the front of the lower abdomen and in the groin.  Contractions may go away when you walk around or change positions while lying down.  Contractions get weaker and are shorter-lasting as time goes on.  The cervix usually does not dilate or become thin. Follow these instructions at home:   Take over-the-counter and prescription medicines only as told by your health care provider.  Keep up with your usual exercises and follow other instructions from your health care provider.  Eat and drink lightly if you think you are going into labor.  If Braxton Hicks contractions are making you uncomfortable: ? Change your position from lying down or resting to walking, or change from walking to resting. ? Sit and rest in a tub of warm water. ? Drink enough fluid to keep your urine pale yellow. Dehydration may cause these contractions. ? Do slow and deep breathing several times an hour.  Keep all follow-up prenatal visits as told by your health care provider. This is important. Contact a health care provider if:  You have a fever.  You have continuous  pain in your abdomen. Get help right away if:  Your contractions become stronger, more regular, and closer together.  You have fluid leaking or gushing from your vagina.  You pass blood-tinged mucus (bloody show).  You have bleeding from your vagina.  You have low back pain that you never had before.  You feel your baby's head pushing down and causing pelvic pressure.  Your baby is not moving inside you as much as it used to. Summary  Contractions that occur before labor are called Braxton Hicks contractions, false labor, or practice contractions.  Braxton Hicks contractions are usually shorter, weaker, farther apart, and less regular than true labor contractions. True labor contractions usually become progressively stronger and regular, and they become more frequent.  Manage discomfort from Braxton Hicks contractions by changing position, resting in a warm bath, drinking plenty of water, or practicing deep breathing. This information is not intended to replace advice given to you by your health care provider. Make sure you discuss any questions you have with your health care provider. Document Released: 08/22/2016 Document Revised: 01/21/2017 Document Reviewed: 08/22/2016 Elsevier Interactive Patient Education  2019 Elsevier Inc.  

## 2018-10-02 NOTE — Progress Notes (Signed)
   TELEHEALTH VIRTUAL OBSTETRICS VISIT ENCOUNTER NOTE  I connected with Kelsey Davies on 10/02/18 at  8:15 AM EDT by telephone at home and verified that I am speaking with the correct person using two identifiers. I was unable to connect with WebEx due to patient issues with connectivity.    I discussed the limitations, risks, security and privacy concerns of performing an evaluation and management service by telephone and the availability of in person appointments. I also discussed with the patient that there may be a patient responsible charge related to this service. The patient expressed understanding and agreed to proceed.  Subjective:  Kelsey Davies is a 28 y.o. G2P1001 at [redacted]w[redacted]d being followed for ongoing prenatal care.  She is currently monitored for the following issues for this low-risk pregnancy and has Idiopathic scoliosis; Low back pain; Neuropathic pain; and Supervision of other normal pregnancy, antepartum on their problem list.  Patient reports no complaints. Reports fetal movement. Denies any contractions, bleeding or leaking of fluid.   The following portions of the patient's history were reviewed and updated as appropriate: allergies, current medications, past family history, past medical history, past social history, past surgical history and problem list.   Objective:   General:  Alert, oriented and cooperative.   Mental Status: Normal mood and affect perceived. Normal judgment and thought content.  Rest of physical exam deferred due to type of encounter  Assessment and Plan:  Pregnancy: G2P1001 at [redacted]w[redacted]d 1. Supervision of other normal pregnancy, antepartum - [place] [facility] OB FOLLOW UP; Patient missed anatomy follow-up, will reschedule  Preterm labor symptoms and general obstetric precautions including but not limited to vaginal bleeding, contractions, leaking of fluid and fetal movement were reviewed in detail with the patient.  I discussed the assessment and treatment plan  with the patient. The patient was provided an opportunity to ask questions and all were answered. The patient agreed with the plan and demonstrated an understanding of the instructions. The patient was advised to call back or seek an in-person office evaluation/go to [facility]U at [facility] for any urgent or concerning symptoms. Please refer to After Visit Summary for other counseling recommendations.   I provided 10 minutes of non-face-to-face time during this encounter.  Return in about 4 weeks (around 10/30/2018) for LOB, In-Person needs cultures.  No future appointments.  Kerry Hough, PA-C [facility] [employer] [facility], [place]

## 2018-10-02 NOTE — Progress Notes (Signed)
I connected with  Bryson Dames on 10/02/18 at  8:15 AM EDT by telephone and verified that I am speaking with the correct person using two identifiers.   I discussed the limitations, risks, security and privacy concerns of performing an evaluation and management service by telephone and virtually and the availability of in person appointments. I also discussed with the patient that there may be a patient responsible charge related to this service. The patient expressed understanding and agreed to proceed.  Linda,RN 10/02/2018  8:29 AM

## 2018-10-05 ENCOUNTER — Ambulatory Visit (HOSPITAL_COMMUNITY)
Admission: RE | Admit: 2018-10-05 | Discharge: 2018-10-05 | Disposition: A | Discharge status: Home / Self Care | Payer: Medicaid Other | Source: Ambulatory Visit | Attending: Medical | Admitting: Medical

## 2018-10-05 ENCOUNTER — Other Ambulatory Visit: Payer: Self-pay

## 2018-10-05 DIAGNOSIS — Z362 Encounter for other antenatal screening follow-up: Secondary | ICD-10-CM

## 2018-10-05 DIAGNOSIS — Z3A32 32 weeks gestation of pregnancy: Secondary | ICD-10-CM

## 2018-10-05 DIAGNOSIS — O26843 Uterine size-date discrepancy, third trimester: Secondary | ICD-10-CM | POA: Diagnosis not present

## 2018-10-05 DIAGNOSIS — Z3480 Encounter for supervision of other normal pregnancy, unspecified trimester: Secondary | ICD-10-CM

## 2018-10-08 ENCOUNTER — Encounter: Payer: Self-pay | Admitting: Medical

## 2018-10-08 DIAGNOSIS — O26843 Uterine size-date discrepancy, third trimester: Secondary | ICD-10-CM | POA: Insufficient documentation

## 2018-10-20 ENCOUNTER — Inpatient Hospital Stay (HOSPITAL_COMMUNITY)
Admission: AD | Admit: 2018-10-20 | Discharge: 2018-10-20 | Disposition: A | Discharge status: Home / Self Care | Payer: Medicaid Other | Attending: Obstetrics and Gynecology | Admitting: Obstetrics and Gynecology

## 2018-10-20 ENCOUNTER — Encounter (HOSPITAL_COMMUNITY): Payer: Self-pay | Admitting: *Deleted

## 2018-10-20 ENCOUNTER — Other Ambulatory Visit: Payer: Self-pay

## 2018-10-20 DIAGNOSIS — R109 Unspecified abdominal pain: Secondary | ICD-10-CM | POA: Diagnosis not present

## 2018-10-20 DIAGNOSIS — Z3A35 35 weeks gestation of pregnancy: Secondary | ICD-10-CM

## 2018-10-20 DIAGNOSIS — O26893 Other specified pregnancy related conditions, third trimester: Secondary | ICD-10-CM | POA: Diagnosis not present

## 2018-10-20 DIAGNOSIS — O26899 Other specified pregnancy related conditions, unspecified trimester: Secondary | ICD-10-CM

## 2018-10-20 LAB — URINALYSIS, ROUTINE W REFLEX MICROSCOPIC
Bilirubin Urine: NEGATIVE
Glucose, UA: NEGATIVE mg/dL
Hgb urine dipstick: NEGATIVE
Ketones, ur: NEGATIVE mg/dL
Leukocytes,Ua: NEGATIVE
Nitrite: NEGATIVE
Protein, ur: NEGATIVE mg/dL
Specific Gravity, Urine: 1.008 (ref 1.005–1.030)
pH: 8 (ref 5.0–8.0)

## 2018-10-20 NOTE — MAU Note (Signed)
Pt reports she is having lower abd pain and cramping off and on every few minutes since last night. Denies any vag bleeding or leaking and reports good fetal movement

## 2018-10-20 NOTE — Discharge Instructions (Signed)
Signs and Symptoms of Labor Labor is your body's natural process of moving your baby, placenta, and umbilical cord out of your uterus. The process of labor usually starts when your baby is full-term, between 37 and 40 weeks of pregnancy. How will I know when I am close to going into labor? As your body prepares for labor and the birth of your baby, you may notice the following symptoms in the weeks and days before true labor starts:  Having a strong desire to get your home ready to receive your new baby. This is called nesting. Nesting may be a sign that labor is approaching, and it may occur several weeks before birth. Nesting may involve cleaning and organizing your home.  Passing a small amount of thick, bloody mucus out of your vagina (normal bloody show or losing your mucus plug). This may happen more than a week before labor begins, or it might occur right before labor begins as the opening of the cervix starts to widen (dilate). For some women, the entire mucus plug passes at once. For others, smaller portions of the mucus plug may gradually pass over several days.  Your baby moving (dropping) lower in your pelvis to get into position for birth (lightening). When this happens, you may feel more pressure on your bladder and pelvic bone and less pressure on your ribs. This may make it easier to breathe. It may also cause you to need to urinate more often and have problems with bowel movements.  Having "practice contractions" (Braxton Hicks contractions) that occur at irregular (unevenly spaced) intervals that are more than 10 minutes apart. This is also called false labor. False labor contractions are common after exercise or sexual activity, and they will stop if you change position, rest, or drink fluids. These contractions are usually mild and do not get stronger over time. They may feel like: ? A backache or back pain. ? Mild cramps, similar to menstrual cramps. ? Tightening or pressure in  your abdomen. Other early symptoms that labor may be starting soon include:  Nausea or loss of appetite.  Diarrhea.  Having a sudden burst of energy, or feeling very tired.  Mood changes.  Having trouble sleeping. How will I know when labor has begun? Signs that true labor has begun may include:  Having contractions that come at regular (evenly spaced) intervals and increase in intensity. This may feel like more intense tightening or pressure in your abdomen that moves to your back. ? Contractions may also feel like rhythmic pain in your upper thighs or back that comes and goes at regular intervals. ? For first-time mothers, this change in intensity of contractions often occurs at a more gradual pace. ? Women who have given birth before may notice a more rapid progression of contraction changes.  Having a feeling of pressure in the vaginal area.  Your water breaking (rupture of membranes). This is when the sac of fluid that surrounds your baby breaks. When this happens, you will notice fluid leaking from your vagina. This may be clear or blood-tinged. Labor usually starts within 24 hours of your water breaking, but it may take longer to begin. ? Some women notice this as a gush of fluid. ? Others notice that their underwear repeatedly becomes damp. Follow these instructions at home:   When labor starts, or if your water breaks, call your health care provider or nurse care line. Based on your situation, they will determine when you should go in for an   exam.  When you are in early labor, you may be able to rest and manage symptoms at home. Some strategies to try at home include: ? Breathing and relaxation techniques. ? Taking a warm bath or shower. ? Listening to music. ? Using a heating pad on the lower back for pain. If you are directed to use heat:  Place a towel between your skin and the heat source.  Leave the heat on for 20-30 minutes.  Remove the heat if your skin turns  bright red. This is especially important if you are unable to feel pain, heat, or cold. You may have a greater risk of getting burned. Get help right away if:  You have painful, regular contractions that are 5 minutes apart or less.  Labor starts before you are [redacted] weeks along in your pregnancy.  You have a fever.  You have a headache that does not go away.  You have bright red blood coming from your vagina.  You do not feel your baby moving.  You have a sudden onset of: ? Severe headache with vision problems. ? Nausea, vomiting, or diarrhea. ? Chest pain or shortness of breath. These symptoms may be an emergency. If your health care provider recommends that you go to the hospital or birth center where you plan to deliver, do not drive yourself. Have someone else drive you, or call emergency services (911 in the [place] Summary  Labor is your body's natural process of moving your baby, placenta, and umbilical cord out of your uterus.  The process of labor usually starts when your baby is full-term, between 37 and 40 weeks of pregnancy.  When labor starts, or if your water breaks, call your health care provider or nurse care line. Based on your situation, they will determine when you should go in for an exam. This information is not intended to replace advice given to you by your health care provider. Make sure you discuss any questions you have with your health care provider. Document Released: 09/13/2016 Document Revised: 01/06/2017 Document Reviewed: 09/13/2016 Elsevier Patient Education  2020 Elsevier Inc.  Fetal Movement Counts Patient Name: ________________________________________________ Patient Due Date: ____________________ What is a fetal movement count?  A fetal movement count is the number of times that you feel your baby move during a certain amount of time. This may also be called a fetal kick count. A fetal movement count is recommended for every pregnant woman. You may  be asked to start counting fetal movements as early as week 28 of your pregnancy. Pay attention to when your baby is most active. You may notice your baby's sleep and wake cycles. You may also notice things that make your baby move more. You should do a fetal movement count:  When your baby is normally most active.  At the same time each day. A good time to count movements is while you are resting, after having something to eat and drink. How do I count fetal movements? 1. Find a quiet, comfortable area. Sit, or lie down on your side. 2. Write down the date, the start time and stop time, and the number of movements that you felt between those two times. Take this information with you to your health care visits. 3. For 2 hours, count kicks, flutters, swishes, rolls, and jabs. You should feel at least 10 movements during 2 hours. 4. You may stop counting after you have felt 10 movements. 5. If you do not feel 10 movements in 2   hours, have something to eat and drink. Then, keep resting and counting for 1 hour. If you feel at least 4 movements during that hour, you may stop counting. Contact a health care provider if:  You feel fewer than 4 movements in 2 hours.  Your baby is not moving like he or she usually does. Date: ____________ Start time: ____________ Stop time: ____________ Movements: ____________ Date: ____________ Start time: ____________ Stop time: ____________ Movements: ____________ Date: ____________ Start time: ____________ Stop time: ____________ Movements: ____________ Date: ____________ Start time: ____________ Stop time: ____________ Movements: ____________ Date: ____________ Start time: ____________ Stop time: ____________ Movements: ____________ Date: ____________ Start time: ____________ Stop time: ____________ Movements: ____________ Date: ____________ Start time: ____________ Stop time: ____________ Movements: ____________ Date: ____________ Start time: ____________ Stop  time: ____________ Movements: ____________ Date: ____________ Start time: ____________ Stop time: ____________ Movements: ____________ This information is not intended to replace advice given to you by your health care provider. Make sure you discuss any questions you have with your health care provider. Document Released: 05/08/2006 Document Revised: 04/28/2018 Document Reviewed: 05/18/2015 Elsevier Patient Education  2020 Elsevier Inc.  

## 2018-10-20 NOTE — MAU Provider Note (Signed)
Chief Complaint:  Abdominal Pain   First Provider Initiated Contact with Patient 10/20/18 1333     HPI: Kelsey Davies is a 28 y.o. G2P1001 at 5565w0d who presents to maternity admissions reporting abdominal pain. Described lower abdominal cramping that occurs every few minutes & started last night. Denies n/v/d, dysuria, vaginal bleeding, or LOF. Good fetal movement. Last BM was this morning. No recent intercourse.   Location: abdomen Quality: cramping Severity: 7/10 in pain scale Duration: <1 day Timing: every few minutes Modifying factors: none Associated signs and symptoms: none  Past Medical History:  Diagnosis Date  . Medical history non-contributory    OB History  Gravida Para Term Preterm AB Living  2 1 1     1   SAB TAB Ectopic Multiple Live Births          1    # Outcome Date GA Lbr Len/2nd Weight Sex Delivery Anes PTL Lv  2 Current           1 Term 06/23/13 3471w0d / 01:04 2660 g M Vag-Spont EPI  LIV   Past Surgical History:  Procedure Laterality Date  . NO PAST SURGERIES     Family History  Problem Relation Age of Onset  . Healthy Mother   . Rheum arthritis Maternal Grandmother   . Rheum arthritis Maternal Grandfather   . Asthma Paternal Grandmother   . Rheum arthritis Sister   . Rheum arthritis Brother   . Hearing loss Neg Hx    Social History   Tobacco Use  . Smoking status: Never Smoker  . Smokeless tobacco: Never Used  Substance Use Topics  . Alcohol use: No  . Drug use: No   No Known Allergies Medications Prior to Admission  Medication Sig Dispense Refill Last Dose  . docusate sodium (COLACE) 100 MG capsule Take 1 capsule (100 mg total) by mouth 2 (two) times daily. 60 capsule 0 10/20/2018 at Unknown time  . Prenatal Vit-Fe Fumarate-FA (PREPLUS) 27-1 MG TABS Take 1 tablet by mouth daily. 30 tablet 3 10/20/2018 at Unknown time  . Elastic Bandages & Supports (COMFORT FIT MATERNITY SUPP LG) MISC 1 Device by Does not apply route daily as needed. (Patient  not taking: Reported on 09/18/2018) 1 each 0   . triamcinolone cream (KENALOG) 0.1 % Apply 1 application topically 2 (two) times daily. (Patient not taking: Reported on 09/18/2018) 30 g 0     I have reviewed patient's Past Medical Hx, Surgical Hx, Family Hx, Social Hx, medications and allergies.   ROS:  Review of Systems  Constitutional: Negative.   Gastrointestinal: Positive for abdominal pain.  Genitourinary: Negative.     Physical Exam   Patient Vitals for the past 24 hrs:  BP Temp Pulse Resp Height Weight  10/20/18 1238 - - - - 5' (1.524 m) 68.5 kg  10/20/18 1237 (!) 111/59 98.1 F (36.7 C) (!) 117 18 - -    Constitutional: Well-developed, well-nourished female in no acute distress.  Cardiovascular: normal rate & rhythm, no murmur Respiratory: normal effort, lung sounds clear throughout GI: Abd soft, non-tender, gravid appropriate for gestational age. Pos BS x 4 MS: Extremities nontender, no edema, normal ROM Neurologic: Alert and oriented x 4.  GU:     Dilation: Closed Effacement (%): 50 Station: -2 Exam by:: E.Hurshel Bouillon,NP  NST:  Baseline: 150 bpm, Variability: Good {> 6 bpm), Accelerations: Reactive, Decelerations: Absent and no contractions   Labs: Results for orders placed or performed during the hospital encounter of 10/20/18 (  from the past 24 hour(s))  Urinalysis, Routine w reflex microscopic     Status: Abnormal   Collection Time: 10/20/18 12:54 PM  Result Value Ref Range   Color, Urine STRAW (A) YELLOW   APPearance CLEAR CLEAR   Specific Gravity, Urine 1.008 1.005 - 1.030   pH 8.0 5.0 - 8.0   Glucose, UA NEGATIVE NEGATIVE mg/dL   Hgb urine dipstick NEGATIVE NEGATIVE   Bilirubin Urine NEGATIVE NEGATIVE   Ketones, ur NEGATIVE NEGATIVE mg/dL   Protein, ur NEGATIVE NEGATIVE mg/dL   Nitrite NEGATIVE NEGATIVE   Leukocytes,Ua NEGATIVE NEGATIVE    Imaging:  No results found.  MAU Course: Orders Placed This Encounter  Procedures  . Urinalysis, Routine w  reflex microscopic  . Discharge patient   No orders of the defined types were placed in this encounter.   MDM: Reactive NST No contractions Cervix closed U/a negative for infection  Assessment: 1. Abdominal cramping affecting pregnancy   2. [redacted] weeks gestation of pregnancy     Plan: Discharge home in stable condition.  Preterm Labor precautions and fetal kick counts  Follow-up Information    [facility] Follow up.   Specialty: Obstetrics and Gynecology Why: return for worsening symptoms Contact information: [address] [id number] [place] [phone]          Allergies as of 10/20/2018   No Known Allergies     Medication List    STOP taking these medications   triamcinolone cream 0.1 % Commonly known as: KENALOG     TAKE these medications   Comfort Fit Maternity Supp Lg Misc 1 Device by Does not apply route daily as needed.   docusate sodium 100 MG capsule Commonly known as: COLACE Take 1 capsule (100 mg total) by mouth 2 (two) times daily.   PrePLUS 27-1 MG Tabs Take 1 tablet by mouth daily.       Jorje Guild, NP 10/20/2018 1:56 PM

## 2018-10-29 ENCOUNTER — Telehealth: Payer: Self-pay | Admitting: Obstetrics & Gynecology

## 2018-10-29 NOTE — Telephone Encounter (Signed)
Called the patient to complete the pre-screen. The patient answered no to COVID19 symptoms and/or being previously diagnosed. Informed the patient of the wearing a face mask, sanitizing hands at the sanitizing station upon entering our office, and no visitors or children are allowed due to the COVID19 restrictions. The patient verbalized understanding. °

## 2018-10-30 ENCOUNTER — Ambulatory Visit (INDEPENDENT_AMBULATORY_CARE_PROVIDER_SITE_OTHER): Payer: Medicaid Other | Admitting: Women's Health

## 2018-10-30 ENCOUNTER — Other Ambulatory Visit (HOSPITAL_COMMUNITY)
Admission: RE | Admit: 2018-10-30 | Discharge: 2018-10-30 | Disposition: A | Discharge status: Home / Self Care | Payer: Medicaid Other | Source: Ambulatory Visit | Attending: Family Medicine | Admitting: Family Medicine

## 2018-10-30 ENCOUNTER — Other Ambulatory Visit: Payer: Self-pay

## 2018-10-30 VITALS — BP 108/75 | HR 102 | Temp 98.2°F | Wt 153.3 lb

## 2018-10-30 DIAGNOSIS — Z3480 Encounter for supervision of other normal pregnancy, unspecified trimester: Secondary | ICD-10-CM

## 2018-10-30 DIAGNOSIS — Z3A36 36 weeks gestation of pregnancy: Secondary | ICD-10-CM

## 2018-10-30 DIAGNOSIS — O26843 Uterine size-date discrepancy, third trimester: Secondary | ICD-10-CM

## 2018-10-30 LAB — OB RESULTS CONSOLE GBS: GBS: NEGATIVE

## 2018-10-30 LAB — OB RESULTS CONSOLE GC/CHLAMYDIA: Gonorrhea: NEGATIVE

## 2018-10-30 NOTE — Progress Notes (Signed)
Subjective:  Kelsey Davies is a 28 y.o. G2P1001 at [redacted]w[redacted]d being seen today for ongoing prenatal care.  She is currently monitored for the following issues for this low-risk pregnancy and has Idiopathic scoliosis; Low back pain; Neuropathic pain; Supervision of other normal pregnancy, antepartum; and Uterine size date discrepancy pregnancy, third trimester on their problem list.  Patient reports no complaints. Pt reports yesterday she experienced some menstrual-like cramping in her pelvis, but this is not present today. Contractions: Not present. Vag. Bleeding: None.  Movement: Present. Denies leaking of fluid.   The following portions of the patient's history were reviewed and updated as appropriate: allergies, current medications, past family history, past medical history, past social history, past surgical history and problem list. Problem list updated.  Objective:   Vitals:   10/30/18 0935  BP: 108/75  Pulse: (!) 102  Temp: 98.2 F (36.8 C)  Weight: 153 lb 4.8 oz (69.5 kg)    Fetal Status: Fetal Heart Rate (bpm): 144 Fundal Height: 36 cm Movement: Present  Presentation: Vertex  General:  Alert, oriented and cooperative. Patient is in no acute distress.  Skin: Skin is warm and dry. No rash noted.   Cardiovascular: Normal heart rate noted  Respiratory: Normal respiratory effort, no problems with respiration noted  Abdomen: Soft, gravid, appropriate for gestational age. Pain/Pressure: Present     Pelvic: Vag. Bleeding: None     Cervical exam performed Dilation: 1 Effacement (%): 50 Station: Ballotable  Extremities: Normal range of motion.  Edema: None  Mental Status: Normal mood and affect. Normal behavior. Normal judgment and thought content.   Assessment and Plan:  Pregnancy: G2P1001 at [redacted]w[redacted]d  1. Supervision of other normal pregnancy, antepartum -discussed s/sx of labor/PTL and when to present to MAU -discussed s/sx of obstetric emergencies and when to report to MAU - Culture,  beta strep (group b only) - GC/Chlamydia probe amp ([place])not at [facility]  2. Uterine size-date discrepancy in third trimester -pt measuring 36cm today @[redacted]w[redacted]d , will schedule f/u US as advised by MFM on scan 10/05/2018 - [place] MFM OB FOLLOW UP; Future   Term labor symptoms and general obstetric precautions including but not limited to vaginal bleeding, contractions, leaking of fluid and fetal movement were reviewed in detail with the patient. Please refer to After Visit Summary for other counseling recommendations.  Return in about 1 week (around 11/06/2018) for virtual visit and f/u US with MFM.   Ignacio Lowder, Gerrie Nordmann, NP

## 2018-10-30 NOTE — Patient Instructions (Addendum)
Abdominal Pain During Pregnancy  Abdominal pain is common during pregnancy, and has many possible causes. Some causes are more serious than others, and sometimes the cause is not known. Abdominal pain can be a sign that labor is starting. It can also be caused by normal growth and stretching of muscles and ligaments during pregnancy. Always tell your health care provider if you have any abdominal pain. Follow these instructions at home:  Do not have sex or put anything in your vagina until your pain goes away completely.  Get plenty of rest until your pain improves.  Drink enough fluid to keep your urine pale yellow.  Take over-the-counter and prescription medicines only as told by your health care provider.  Keep all follow-up visits as told by your health care provider. This is important. Contact a health care provider if:  Your pain continues or gets worse after resting.  You have lower abdominal pain that: ? Comes and goes at regular intervals. ? Spreads to your back. ? Is similar to menstrual cramps.  You have pain or burning when you urinate. Get help right away if:  You have a fever or chills.  You have vaginal bleeding.  You are leaking fluid from your vagina.  You are passing tissue from your vagina.  You have vomiting or diarrhea that lasts for more than 24 hours.  Your baby is moving less than usual.  You feel very weak or faint.  You have shortness of breath.  You develop severe pain in your upper abdomen. Summary  Abdominal pain is common during pregnancy, and has many possible causes.  If you experience abdominal pain during pregnancy, tell your health care provider right away.  Follow your health care provider's home care instructions and keep all follow-up visits as directed. This information is not intended to replace advice given to you by your health care provider. Make sure you discuss any questions you have with your health care  provider. Document Released: 04/08/2005 Document Revised: 07/27/2018 Document Reviewed: 07/11/2016 Elsevier Patient Education  2020 [employer]       Signs and Symptoms of Labor Labor is your body's natural process of moving your baby, placenta, and umbilical cord out of your uterus. The process of labor usually starts when your baby is full-term, between 3437 and 40 weeks of pregnancy. How will I know when I am close to going into labor? As your body prepares for labor and the birth of your baby, you may notice the following symptoms in the weeks and days before true labor starts:  Having a strong desire to get your home ready to receive your new baby. This is called nesting. Nesting may be a sign that labor is approaching, and it may occur several weeks before birth. Nesting may involve cleaning and organizing your home.  Passing a small amount of thick, bloody mucus out of your vagina (normal bloody show or losing your mucus plug). This may happen more than a week before labor begins, or it might occur right before labor begins as the opening of the cervix starts to widen (dilate). For some women, the entire mucus plug passes at once. For others, smaller portions of the mucus plug may gradually pass over several days.  Your baby moving (dropping) lower in your pelvis to get into position for birth (lightening). When this happens, you may feel more pressure on your bladder and pelvic bone and less pressure on your ribs. This may make it easier to breathe. It  may also cause you to need to urinate more often and have problems with bowel movements.  Having "practice contractions" (Braxton Hicks contractions) that occur at irregular (unevenly spaced) intervals that are more than 10 minutes apart. This is also called false labor. False labor contractions are common after exercise or sexual activity, and they will stop if you change position, rest, or drink fluids. These contractions are usually  mild and do not get stronger over time. They may feel like: ? A backache or back pain. ? Mild cramps, similar to menstrual cramps. ? Tightening or pressure in your abdomen. Other early symptoms that labor may be starting soon include:  Nausea or loss of appetite.  Diarrhea.  Having a sudden burst of energy, or feeling very tired.  Mood changes.  Having trouble sleeping. How will I know when labor has begun? Signs that true labor has begun may include:  Having contractions that come at regular (evenly spaced) intervals and increase in intensity. This may feel like more intense tightening or pressure in your abdomen that moves to your back. ? Contractions may also feel like rhythmic pain in your upper thighs or back that comes and goes at regular intervals. ? For first-time mothers, this change in intensity of contractions often occurs at a more gradual pace. ? Women who have given birth before may notice a more rapid progression of contraction changes.  Having a feeling of pressure in the vaginal area.  Your water breaking (rupture of membranes). This is when the sac of fluid that surrounds your baby breaks. When this happens, you will notice fluid leaking from your vagina. This may be clear or blood-tinged. Labor usually starts within 24 hours of your water breaking, but it may take longer to begin. ? Some women notice this as a gush of fluid. ? Others notice that their underwear repeatedly becomes damp. Follow these instructions at home:   When labor starts, or if your water breaks, call your health care provider or nurse care line. Based on your situation, they will determine when you should go in for an exam.  When you are in early labor, you may be able to rest and manage symptoms at home. Some strategies to try at home include: ? Breathing and relaxation techniques. ? Taking a warm bath or shower. ? Listening to music. ? Using a heating pad on the lower back for pain. If you  are directed to use heat:  Place a towel between your skin and the heat source.  Leave the heat on for 20-30 minutes.  Remove the heat if your skin turns bright red. This is especially important if you are unable to feel pain, heat, or cold. You may have a greater risk of getting burned. Get help right away if:  You have painful, regular contractions that are 5 minutes apart or less.  Labor starts before you are [redacted] weeks along in your pregnancy.  You have a fever.  You have a headache that does not go away.  You have bright red blood coming from your vagina.  You do not feel your baby moving.  You have a sudden onset of: ? Severe headache with vision problems. ? Nausea, vomiting, or diarrhea. ? Chest pain or shortness of breath. These symptoms may be an emergency. If your health care provider recommends that you go to the hospital or birth center where you plan to deliver, do not drive yourself. Have someone else drive you, or call emergency services (911  in the [place] Summary  Labor is your body's natural process of moving your baby, placenta, and umbilical cord out of your uterus.  The process of labor usually starts when your baby is full-term, between 1337 and 40 weeks of pregnancy.  When labor starts, or if your water breaks, call your health care provider or nurse care line. Based on your situation, they will determine when you should go in for an exam. This information is not intended to replace advice given to you by your health care provider. Make sure you discuss any questions you have with your health care provider. Document Released: 09/13/2016 Document Revised: 01/06/2017 Document Reviewed: 09/13/2016 Elsevier Patient Education  2020 [employer]

## 2018-11-02 LAB — GC/CHLAMYDIA PROBE AMP (~~LOC~~) NOT AT ARMC
Chlamydia: NEGATIVE
Neisseria Gonorrhea: NEGATIVE

## 2018-11-03 LAB — CULTURE, BETA STREP (GROUP B ONLY): Strep Gp B Culture: NEGATIVE

## 2018-11-09 ENCOUNTER — Other Ambulatory Visit: Payer: Self-pay

## 2018-11-09 ENCOUNTER — Ambulatory Visit (INDEPENDENT_AMBULATORY_CARE_PROVIDER_SITE_OTHER): Payer: Medicaid Other | Admitting: Nurse Practitioner

## 2018-11-09 VITALS — BP 110/74 | HR 95

## 2018-11-09 DIAGNOSIS — O26843 Uterine size-date discrepancy, third trimester: Secondary | ICD-10-CM

## 2018-11-09 DIAGNOSIS — Z3480 Encounter for supervision of other normal pregnancy, unspecified trimester: Secondary | ICD-10-CM

## 2018-11-09 DIAGNOSIS — Z3A37 37 weeks gestation of pregnancy: Secondary | ICD-10-CM

## 2018-11-09 NOTE — Progress Notes (Signed)
TELEHEALTH OBSTETRICS PRENATAL VIRTUAL VIDEO VISIT ENCOUNTER NOTE  [facility]   I connected with Kelsey Davies on 11/09/18 at  2:35 PM EDT by WebEx Video Encounter (provider on video - client and interpreter on phone - client not able to understand how to connect via video) at home and verified that I am speaking with the correct person using two identifiers.   I discussed the limitations, risks, security and privacy concerns of performing an evaluation and management service virtually and the availability of in person appointments. I also discussed with the patient that there may be a patient responsible charge related to this service. The patient expressed understanding and agreed to proceed.  Interpreter present on phone call for the entire visit.  Subjective:  Kelsey Davies is a 28 y.o. G2P1001 at 9564w6d being seen today for ongoing prenatal care.  She is currently monitored for the following issues for this low-risk pregnancy and has Idiopathic scoliosis; Low back pain; Neuropathic pain; Supervision of other normal pregnancy, antepartum; and Uterine size date discrepancy pregnancy, third trimester on their problem list.  Patient reports having pain at urethra - same pain she has had for a couple of weeks but worsening.  Asked about lab results from last urinalysis.  Had pain last night which would come and go and was bad.  Now it seems to be coming back.  Having bad pain every 4-5 minutes and has some leaking of fluid..  Contractions: Irritability. Vag. Bleeding: None.  Movement: Present. Denies any leaking of fluid.  Client asked about lab results from previously when she had pain at the urethra - urine culture was negative.  Advised this is likely from pressure of the baby coming down further in the pelvis.  The following portions of the patient's history were reviewed and updated as appropriate: allergies, current medications, past  family history, past medical history, past social history, past surgical history and problem list.   Objective:   Vitals:   11/09/18 1438  BP: 110/74  Pulse: 95    Fetal Status:     Movement: Present     General:  Alert, oriented and cooperative. Patient is in no acute distress.  Respiratory: Normal respiratory effort, no problems with respiration noted  Mental Status: Normal mood and affect. Normal behavior. Normal judgment and thought content.  Rest of physical exam deferred due to type of encounter  Imaging: No results found.  Assessment and Plan:  Pregnancy: G2P1001 at 264w6d  1.  Supervision of pregnancy  From client's description on the phone, sounds like she has possible ROM (some leaking) and possible labor contractions (pain that keeps coming since last night, worsening, comes every 4-5 minutes).  Hard to get detailed information from client but advised she be checked at [facility] today.  States she has had pains while we were on the phone. Reminded client of additional appointments for prenatal care however she has an appointment tomorrow for ultrasound.  Did not remind client of this appointment.   Term labor symptoms and general obstetric precautions including but not limited to vaginal bleeding, contractions, leaking of fluid and fetal movement were reviewed in detail with the patient. I discussed the assessment and treatment plan with the patient. The patient was provided an opportunity to ask questions and all were answered. The patient agreed with the plan and demonstrated an understanding of the instructions. The patient was advised to call back or seek an in-person office evaluation/go to  [facility]U at [facility] for any urgent or concerning symptoms. Please refer to After Visit Summary for other counseling recommendations.   I provided 15 minutes of face-to-face time during this encounter.  Return for keep appointments as scheduled.  Future  Appointments  Date Time Provider [place]  11/10/2018 12:45 PM [place] 2 WH-MFCUS MFC-US  11/16/2018  3:15 PM Tresea Mall, CNM WOC-WOCA [place]  11/23/2018  9:35 AM Tresea Mall, CNM WOC-WOCA [place]  11/26/2018  9:35 AM Rasch, Artist Pais, NP WOC-WOCA WOC  11/26/2018 11:15 AM WOC-WOCA NST [place], NP [facility]

## 2018-11-10 ENCOUNTER — Ambulatory Visit (HOSPITAL_COMMUNITY): Payer: Medicaid Other | Admitting: *Deleted

## 2018-11-10 ENCOUNTER — Encounter (HOSPITAL_COMMUNITY): Payer: Self-pay | Admitting: *Deleted

## 2018-11-10 ENCOUNTER — Encounter: Payer: Self-pay | Admitting: Medical

## 2018-11-10 ENCOUNTER — Other Ambulatory Visit: Payer: Self-pay | Admitting: Medical

## 2018-11-10 ENCOUNTER — Ambulatory Visit (HOSPITAL_BASED_OUTPATIENT_CLINIC_OR_DEPARTMENT_OTHER): Payer: Medicaid Other | Admitting: *Deleted

## 2018-11-10 ENCOUNTER — Other Ambulatory Visit: Payer: Self-pay

## 2018-11-10 ENCOUNTER — Ambulatory Visit (HOSPITAL_COMMUNITY)
Admission: RE | Admit: 2018-11-10 | Discharge: 2018-11-10 | Disposition: A | Discharge status: Home / Self Care | Payer: Medicaid Other | Source: Ambulatory Visit | Attending: Obstetrics and Gynecology | Admitting: Obstetrics and Gynecology

## 2018-11-10 ENCOUNTER — Other Ambulatory Visit: Payer: Self-pay | Admitting: Women's Health

## 2018-11-10 DIAGNOSIS — O365931 Maternal care for other known or suspected poor fetal growth, third trimester, fetus 1: Secondary | ICD-10-CM

## 2018-11-10 DIAGNOSIS — Z3480 Encounter for supervision of other normal pregnancy, unspecified trimester: Secondary | ICD-10-CM | POA: Diagnosis present

## 2018-11-10 DIAGNOSIS — O26842 Uterine size-date discrepancy, second trimester: Secondary | ICD-10-CM | POA: Diagnosis not present

## 2018-11-10 DIAGNOSIS — Z362 Encounter for other antenatal screening follow-up: Secondary | ICD-10-CM

## 2018-11-10 DIAGNOSIS — O365990 Maternal care for other known or suspected poor fetal growth, unspecified trimester, not applicable or unspecified: Secondary | ICD-10-CM

## 2018-11-10 DIAGNOSIS — Z3A38 38 weeks gestation of pregnancy: Secondary | ICD-10-CM | POA: Diagnosis not present

## 2018-11-10 DIAGNOSIS — O26843 Uterine size-date discrepancy, third trimester: Secondary | ICD-10-CM | POA: Diagnosis not present

## 2018-11-10 NOTE — Procedures (Signed)
Kelsey Davies Nov 18, 1990 [redacted]w[redacted]d  Fetus A Non-Stress Test Interpretation for 11/10/18  Indication: IUGR  Fetal Heart Rate A Mode: External Baseline Rate (A): 140 bpm Variability: Moderate Accelerations: 15 x 15 Decelerations: None Multiple birth?: No  Uterine Activity Mode: Toco Contraction Frequency (min): occ UC noted Contraction Duration (sec): 60-80 Contraction Quality: Mild Resting Tone Palpated: Relaxed Resting Time: Adequate  Interpretation (Fetal Testing) Nonstress Test Interpretation: Reactive Comments: FHR tracing rev'd by Dr. Gertie Exon

## 2018-11-16 ENCOUNTER — Ambulatory Visit (INDEPENDENT_AMBULATORY_CARE_PROVIDER_SITE_OTHER): Payer: Medicaid Other | Admitting: Advanced Practice Midwife

## 2018-11-16 ENCOUNTER — Other Ambulatory Visit: Payer: Self-pay

## 2018-11-16 VITALS — BP 112/80 | HR 102

## 2018-11-16 DIAGNOSIS — Z3A38 38 weeks gestation of pregnancy: Secondary | ICD-10-CM

## 2018-11-16 DIAGNOSIS — O0993 Supervision of high risk pregnancy, unspecified, third trimester: Secondary | ICD-10-CM

## 2018-11-16 DIAGNOSIS — O365930 Maternal care for other known or suspected poor fetal growth, third trimester, not applicable or unspecified: Secondary | ICD-10-CM

## 2018-11-16 NOTE — Progress Notes (Signed)
I connected with  Kelsey Davies on 11/16/18 at  3:15 PM EDT by telephone and verified that I am speaking with the correct person using two identifiers.   I discussed the limitations, risks, security and privacy concerns of performing an evaluation and management service by telephone and virtually and the availability of in person appointments. I also discussed with the patient that there may be a patient responsible charge related to this service. The patient expressed understanding and agreed to proceed. Her husband tested + corona virus Friday 7/24 and is staying in home self isolating. Patient denies symptoms. Informed her if she has appt and is having symptoms to not come to office- to call to reschedule.   Edan Juday,RN 11/16/2018  3:18 PM

## 2018-11-16 NOTE — Progress Notes (Signed)
   TELEHEALTH VIRTUAL OBSTETRICS VISIT ENCOUNTER NOTE  I connected with Kelsey Davies on 11/16/18 at  3:15 PM EDT by telephone at home and verified that I am speaking with the correct person using two identifiers.   I discussed the limitations, risks, security and privacy concerns of performing an evaluation and management service by telephone and the availability of in person appointments. I also discussed with the patient that there may be a patient responsible charge related to this service. The patient expressed understanding and agreed to proceed.  Subjective:  Kelsey Davies is a 28 y.o. G2P1001 at [redacted]w[redacted]d being followed for ongoing prenatal care.  She is currently monitored for the following issues for this low-risk pregnancy and has Idiopathic scoliosis; Low back pain; Neuropathic pain; Supervision of other normal pregnancy, antepartum; Uterine size date discrepancy pregnancy, third trimester; and IUGR (intrauterine growth restriction) affecting care of mother, third trimester, fetus 1 on their problem list.  Patient reports no complaints. Reports fetal movement. Denies any contractions, bleeding or leaking of fluid.   The following portions of the patient's history were reviewed and updated as appropriate: allergies, current medications, past family history, past medical history, past social history, past surgical history and problem list.   Objective:   General:  Alert, oriented and cooperative.   Mental Status: Normal mood and affect perceived. Normal judgment and thought content.  Rest of physical exam deferred due to type of encounter  Assessment and Plan:  Pregnancy: G2P1001 at [redacted]w[redacted]d 1. Supervision of high risk pregnancy in third trimester - Nepali interpretor used for visit  2. Intrauterine growth restriction (IUGR) affecting care of mother, third trimester, single or unspecified fetus - Patient is scheduled for IOL tomorrow  - FOB tested + for COVID yesterday  - Patient  tested at the HD on 11/16/2018. Unsure if she will have results available by IOL for 11/17/2018. Patient aware that we may have to repeat testing with a rapid test on admission. Patient denies any symptoms at this time.   Term labor symptoms and general obstetric precautions including but not limited to vaginal bleeding, contractions, leaking of fluid and fetal movement were reviewed in detail with the patient.  I discussed the assessment and treatment plan with the patient. The patient was provided an opportunity to ask questions and all were answered. The patient agreed with the plan and demonstrated an understanding of the instructions. The patient was advised to call back or seek an in-person office evaluation/go to MAU at [facility] for any urgent or concerning symptoms. Please refer to After Visit Summary for other counseling recommendations.   I provided 15 minutes of non-face-to-face time during this encounter.  Return for Patient is scheduled for IOL. Needs 6 week PP visit scheduled .  Future Appointments  Date Time Provider [place]  11/17/2018  7:30 AM [facility]-LD [place] None    Marcille Buffy DNP, CNM  11/16/18  3:43 PM  Center for [employer], [facility]

## 2018-11-17 ENCOUNTER — Inpatient Hospital Stay (HOSPITAL_COMMUNITY): Payer: Medicaid Other | Admitting: Anesthesiology

## 2018-11-17 ENCOUNTER — Other Ambulatory Visit: Payer: Self-pay

## 2018-11-17 ENCOUNTER — Encounter (HOSPITAL_COMMUNITY): Payer: Self-pay

## 2018-11-17 ENCOUNTER — Inpatient Hospital Stay (HOSPITAL_COMMUNITY)
Admission: AD | Admit: 2018-11-17 | Discharge: 2018-11-19 | DRG: 807 | Disposition: A | Discharge status: Home / Self Care | Payer: Medicaid Other | Attending: Family Medicine | Admitting: Family Medicine

## 2018-11-17 ENCOUNTER — Inpatient Hospital Stay (HOSPITAL_COMMUNITY): Payer: Medicaid Other

## 2018-11-17 DIAGNOSIS — Z1159 Encounter for screening for other viral diseases: Secondary | ICD-10-CM

## 2018-11-17 DIAGNOSIS — O26843 Uterine size-date discrepancy, third trimester: Secondary | ICD-10-CM

## 2018-11-17 DIAGNOSIS — Z8759 Personal history of other complications of pregnancy, childbirth and the puerperium: Secondary | ICD-10-CM | POA: Diagnosis present

## 2018-11-17 DIAGNOSIS — Z3480 Encounter for supervision of other normal pregnancy, unspecified trimester: Secondary | ICD-10-CM

## 2018-11-17 DIAGNOSIS — Z20822 Contact with and (suspected) exposure to covid-19: Secondary | ICD-10-CM

## 2018-11-17 DIAGNOSIS — Z20828 Contact with and (suspected) exposure to other viral communicable diseases: Secondary | ICD-10-CM | POA: Diagnosis present

## 2018-11-17 DIAGNOSIS — O365930 Maternal care for other known or suspected poor fetal growth, third trimester, not applicable or unspecified: Secondary | ICD-10-CM

## 2018-11-17 DIAGNOSIS — Z3A39 39 weeks gestation of pregnancy: Secondary | ICD-10-CM

## 2018-11-17 DIAGNOSIS — O365931 Maternal care for other known or suspected poor fetal growth, third trimester, fetus 1: Secondary | ICD-10-CM

## 2018-11-17 LAB — ABO/RH: ABO/RH(D): A POS

## 2018-11-17 LAB — CBC
HCT: 39.5 % (ref 36.0–46.0)
Hemoglobin: 12.8 g/dL (ref 12.0–15.0)
MCH: 27.8 pg (ref 26.0–34.0)
MCHC: 32.4 g/dL (ref 30.0–36.0)
MCV: 85.9 fL (ref 80.0–100.0)
Platelets: 381 10*3/uL (ref 150–400)
RBC: 4.6 MIL/uL (ref 3.87–5.11)
RDW: 14.7 % (ref 11.5–15.5)
WBC: 11 10*3/uL — ABNORMAL HIGH (ref 4.0–10.5)
nRBC: 0 % (ref 0.0–0.2)

## 2018-11-17 LAB — SARS CORONAVIRUS 2 BY RT PCR (HOSPITAL ORDER, PERFORMED IN ~~LOC~~ HOSPITAL LAB): SARS Coronavirus 2: NEGATIVE

## 2018-11-17 LAB — TYPE AND SCREEN
ABO/RH(D): A POS
Antibody Screen: NEGATIVE

## 2018-11-17 LAB — RPR: RPR Ser Ql: NONREACTIVE

## 2018-11-17 MED ORDER — DIPHENHYDRAMINE HCL 25 MG PO CAPS: 25.0000 mg | ORAL_CAPSULE | Freq: Four times a day (QID) | ORAL | Status: DC | PRN

## 2018-11-17 MED ORDER — TETANUS-DIPHTH-ACELL PERTUSSIS 5-2.5-18.5 LF-MCG/0.5 IM SUSP: 0.5000 mL | Freq: Once | INTRAMUSCULAR | Status: DC

## 2018-11-17 MED ORDER — EPHEDRINE 5 MG/ML INJ: 10.0000 mg | INTRAVENOUS | Status: DC | PRN

## 2018-11-17 MED ORDER — DIBUCAINE (PERIANAL) 1 % EX OINT: 1.0000 "application " | TOPICAL_OINTMENT | CUTANEOUS | Status: DC | PRN

## 2018-11-17 MED ORDER — ZOLPIDEM TARTRATE 5 MG PO TABS: 5.0000 mg | ORAL_TABLET | Freq: Every evening | ORAL | Status: DC | PRN

## 2018-11-17 MED ORDER — LACTATED RINGERS IV SOLN: 500.0000 mL | INTRAVENOUS | Status: DC | PRN

## 2018-11-17 MED ORDER — OXYTOCIN 40 UNITS IN NORMAL SALINE INFUSION - SIMPLE MED
2.5000 [IU]/h | INTRAVENOUS | Status: DC
  Filled 2018-11-17 (×2): qty 1000

## 2018-11-17 MED ORDER — SIMETHICONE 80 MG PO CHEW: 80.0000 mg | CHEWABLE_TABLET | ORAL | Status: DC | PRN

## 2018-11-17 MED ORDER — TERBUTALINE SULFATE 1 MG/ML IJ SOLN
0.2500 mg | Freq: Once | INTRAMUSCULAR | Status: AC | PRN
  Administered 2018-11-17: 0.25 mg via SUBCUTANEOUS

## 2018-11-17 MED ORDER — LIDOCAINE HCL (PF) 1 % IJ SOLN: INTRAMUSCULAR | Status: DC | PRN

## 2018-11-17 MED ORDER — IBUPROFEN 600 MG PO TABS
600.0000 mg | ORAL_TABLET | Freq: Four times a day (QID) | ORAL | Status: DC
  Administered 2018-11-18 – 2018-11-19 (×6): 600 mg via ORAL
  Filled 2018-11-17 (×6): qty 1

## 2018-11-17 MED ORDER — OXYCODONE-ACETAMINOPHEN 5-325 MG PO TABS: 1.0000 | ORAL_TABLET | ORAL | Status: DC | PRN

## 2018-11-17 MED ORDER — LACTATED RINGERS IV SOLN
500.0000 mL | Freq: Once | INTRAVENOUS | Status: AC
  Administered 2018-11-17: 500 mL via INTRAVENOUS

## 2018-11-17 MED ORDER — FLEET ENEMA 7-19 GM/118ML RE ENEM: 1.0000 | ENEMA | RECTAL | Status: DC | PRN

## 2018-11-17 MED ORDER — LACTATED RINGERS AMNIOINFUSION
INTRAVENOUS | Status: DC
  Administered 2018-11-17: 19:00:00 via INTRAUTERINE

## 2018-11-17 MED ORDER — PHENYLEPHRINE 40 MCG/ML (10ML) SYRINGE FOR IV PUSH (FOR BLOOD PRESSURE SUPPORT)
80.0000 ug | PREFILLED_SYRINGE | INTRAVENOUS | Status: DC | PRN
  Filled 2018-11-17: qty 10

## 2018-11-17 MED ORDER — FENTANYL-BUPIVACAINE-NACL 0.5-0.125-0.9 MG/250ML-% EP SOLN
12.0000 mL/h | EPIDURAL | Status: DC | PRN
  Filled 2018-11-17: qty 250

## 2018-11-17 MED ORDER — OXYCODONE-ACETAMINOPHEN 5-325 MG PO TABS: 2.0000 | ORAL_TABLET | ORAL | Status: DC | PRN

## 2018-11-17 MED ORDER — LACTATED RINGERS IV SOLN
INTRAVENOUS | Status: DC
  Administered 2018-11-17 (×2): via INTRAVENOUS

## 2018-11-17 MED ORDER — ONDANSETRON HCL 4 MG/2ML IJ SOLN: 4.0000 mg | INTRAMUSCULAR | Status: DC | PRN

## 2018-11-17 MED ORDER — PHENYLEPHRINE 40 MCG/ML (10ML) SYRINGE FOR IV PUSH (FOR BLOOD PRESSURE SUPPORT)
80.0000 ug | PREFILLED_SYRINGE | INTRAVENOUS | Status: DC | PRN
  Administered 2018-11-17: 80 ug via INTRAVENOUS

## 2018-11-17 MED ORDER — SENNOSIDES-DOCUSATE SODIUM 8.6-50 MG PO TABS
2.0000 | ORAL_TABLET | ORAL | Status: DC
  Administered 2018-11-18 – 2018-11-19 (×2): 2 via ORAL
  Filled 2018-11-17 (×2): qty 2

## 2018-11-17 MED ORDER — BENZOCAINE-MENTHOL 20-0.5 % EX AERO
1.0000 "application " | INHALATION_SPRAY | CUTANEOUS | Status: DC | PRN
  Administered 2018-11-18: 1 via TOPICAL
  Filled 2018-11-17: qty 56

## 2018-11-17 MED ORDER — FENTANYL CITRATE (PF) 100 MCG/2ML IJ SOLN
100.0000 ug | INTRAMUSCULAR | Status: DC | PRN
  Administered 2018-11-17: 100 ug via INTRAVENOUS
  Filled 2018-11-17: qty 2

## 2018-11-17 MED ORDER — ACETAMINOPHEN 325 MG PO TABS: 650.0000 mg | ORAL_TABLET | ORAL | Status: DC | PRN

## 2018-11-17 MED ORDER — ONDANSETRON HCL 4 MG PO TABS: 4.0000 mg | ORAL_TABLET | ORAL | Status: DC | PRN

## 2018-11-17 MED ORDER — SOD CITRATE-CITRIC ACID 500-334 MG/5ML PO SOLN: 30.0000 mL | ORAL | Status: DC | PRN

## 2018-11-17 MED ORDER — COCONUT OIL OIL: 1.0000 "application " | TOPICAL_OIL | Status: DC | PRN

## 2018-11-17 MED ORDER — WITCH HAZEL-GLYCERIN EX PADS: 1.0000 "application " | MEDICATED_PAD | CUTANEOUS | Status: DC | PRN

## 2018-11-17 MED ORDER — TERBUTALINE SULFATE 1 MG/ML IJ SOLN
0.2500 mg | Freq: Once | INTRAMUSCULAR | Status: DC | PRN
  Filled 2018-11-17: qty 1

## 2018-11-17 MED ORDER — DIPHENHYDRAMINE HCL 50 MG/ML IJ SOLN: 12.5000 mg | INTRAMUSCULAR | Status: DC | PRN

## 2018-11-17 MED ORDER — PRENATAL MULTIVITAMIN CH
1.0000 | ORAL_TABLET | Freq: Every day | ORAL | Status: DC
  Administered 2018-11-18: 1 via ORAL
  Filled 2018-11-17: qty 1

## 2018-11-17 MED ORDER — OXYTOCIN BOLUS FROM INFUSION
500.0000 mL | Freq: Once | INTRAVENOUS | Status: AC
  Administered 2018-11-17: 500 mL via INTRAVENOUS

## 2018-11-17 MED ORDER — OXYTOCIN 40 UNITS IN NORMAL SALINE INFUSION - SIMPLE MED
1.0000 m[IU]/min | INTRAVENOUS | Status: DC
  Administered 2018-11-17: 2 m[IU]/min via INTRAVENOUS

## 2018-11-17 MED ORDER — SODIUM CHLORIDE (PF) 0.9 % IJ SOLN
INTRAMUSCULAR | Status: DC | PRN
  Administered 2018-11-17: 12 mL/h via EPIDURAL

## 2018-11-17 MED ORDER — LIDOCAINE HCL (PF) 1 % IJ SOLN
INTRAMUSCULAR | Status: DC | PRN
  Administered 2018-11-17: 6 mL via EPIDURAL

## 2018-11-17 MED ORDER — LIDOCAINE HCL (PF) 1 % IJ SOLN: 30.0000 mL | INTRAMUSCULAR | Status: DC | PRN

## 2018-11-17 MED ORDER — ONDANSETRON HCL 4 MG/2ML IJ SOLN: 4.0000 mg | Freq: Four times a day (QID) | INTRAMUSCULAR | Status: DC | PRN

## 2018-11-17 NOTE — Progress Notes (Signed)
Called to room by RN for prolonged decel to 80-90 bpms lasting approximately 4 minutes. Cervix 7/90/0. Prior to this, patient with recurrent variables. Had decreased Pitocin due to MVUs >200 and started amnioinfusion. Patient appears to be tachysystole with inadequate recovery between contractions. Terbutaline given. FHR recovered to 150-160 bpm. Discussed with patient that if recurred without fetal recovery and further cervical progression, would be an indication for delivery by CS. Will plan to monitor/allow for fetal recovery and restart Pitocin if strip reassuring.   Phill Myron, D.O. [facility] Family Medicine Fellow, [facility] for [facility], [place] [facility] 11/17/2018, 7:05 PM

## 2018-11-17 NOTE — Progress Notes (Signed)
OB/GYN Faculty Practice: Labor Progress Note  Subjective: Patient doing well without complaints.  Objective: BP 98/69   Pulse (!) 104   Temp 97.8 F (36.6 C) (Oral)   Resp 17   Ht 4\' 11"  (1.499 m)   Wt 69.4 kg   LMP 02/17/2018 (Exact Date)   SpO2 99%   BMI 30.90 kg/m  Gen: well appearing, NAD Dilation: 6 Effacement (%): 80 Station: -2 Presentation: Vertex Exam by:: Dr. Sylvester Harder  Assessment and Plan: 28 y.o. G2P1001 [redacted]w[redacted]d admitted for IOL-IUGR  Labor: unchanged from last exam, will initiate pit -- pain control: CSE  Fetal Well-Being: EFW 2613 (6%ile). Cephalic by BSUS.  -- Category 1 - continuous fetal monitoring  -- GBS negative   Corliss Blacker, PGY-III Family medicine  3:46 PM

## 2018-11-17 NOTE — H&P (Signed)
LABOR AND DELIVERY ADMISSION HISTORY AND PHYSICAL NOTE  Kelsey Davies is a 28 y.o. female G2P1001 with IUP at 7276w0d by LMP presenting for IOL for IUGR.  She reports positive fetal movement. She denies leakage of fluid or vaginal bleeding.  Prenatal History/Complications: [facility] at [facility]  Pregnancy complications:  - IUGR; 2613g/6% with normal dopplers at 38w  -FOB COVID+, mother tested negative at admission  -language barrier, patient speaks Nepali   Past Medical History: Past Medical History:  Diagnosis Date  . Medical history non-contributory     Past Surgical History: Past Surgical History:  Procedure Laterality Date  . NO PAST SURGERIES      Obstetrical History: OB History    Gravida  2   Para  1   Term  1   Preterm      AB      Living  1     SAB      TAB      Ectopic      Multiple      Live Births  1           Social History: Social History   Socioeconomic History  . Marital status: Married    Spouse name: Not on file  . Number of children: Not on file  . Years of education: Not on file  . Highest education level: Not on file  Occupational History  . Not on file  Social Needs  . Financial resource strain: Not on file  . Food insecurity    Worry: Never true    Inability: Never true  . Transportation needs    Medical: No    Non-medical: No  Tobacco Use  . Smoking status: Never Smoker  . Smokeless tobacco: Never Used  Substance and Sexual Activity  . Alcohol use: No  . Drug use: No  . Sexual activity: Yes    Birth control/protection: None  Lifestyle  . Physical activity    Days per week: Not on file    Minutes per session: Not on file  . Stress: Not on file  Relationships  . Social Musicianconnections    Talks on phone: Not on file    Gets together: Not on file    Attends religious service: Not on file    Active member of club or organization: Not on file    Attends meetings of clubs or organizations: Not on file    Relationship  status: Not on file  Other Topics Concern  . Not on file  Social History Narrative  . Not on file    Family History: Family History  Problem Relation Age of Onset  . Healthy Mother   . Rheum arthritis Maternal Grandmother   . Rheum arthritis Maternal Grandfather   . Asthma Paternal Grandmother   . Rheum arthritis Sister   . Rheum arthritis Brother   . Hearing loss Neg Hx     Allergies: No Known Allergies  Medications Prior to Admission  Medication Sig Dispense Refill Last Dose  . docusate sodium (COLACE) 100 MG capsule Take 1 capsule (100 mg total) by mouth 2 (two) times daily. (Patient not taking: Reported on 10/30/2018) 60 capsule 0   . Elastic Bandages & Supports (COMFORT FIT MATERNITY SUPP LG) MISC 1 Device by Does not apply route daily as needed. (Patient not taking: Reported on 09/18/2018) 1 each 0   . Prenatal Vit-Fe Fumarate-FA (PREPLUS) 27-1 MG TABS Take 1 tablet by mouth daily. 30 tablet 3  Review of Systems  All systems reviewed and negative except as stated in HPI  Physical Exam Blood pressure 101/72, pulse 86, resp. rate 18, height 4\' 11"  (1.499 m), weight 69.4 kg, last menstrual period 02/17/2018. General appearance: alert, oriented, NAD Lungs: normal respiratory effort Heart: regular rate Abdomen: soft, non-tender; gravid, FH appropriate for GA Extremities: No calf swelling or tenderness Presentation: cephalic Fetal monitoring: 145 bpm, moderate variability, +acels, no decels  Uterine activity: Occasional  Dilation: 1 Effacement (%): 50 Station: -2 Exam by:: August Albino RNC   Prenatal labs: ABO, Rh: --/--/A POS (07/28 0803) Antibody: NEG (07/28 0803) Rubella: 30.90 (01/20 1444) RPR: Non Reactive (05/12 0833)  HBsAg: Negative (01/20 1444)  HIV: Non Reactive (05/12 0833)  GC/Chlamydia: Negative  GBS: Negative (07/10 0000)  2-hr GTT: Normal  Genetic screening:  Quad Negative  Anatomy US: Normal anatomy   Prenatal Transfer Tool  Maternal  Diabetes: No Genetic Screening: Normal Maternal Ultrasounds/Referrals: IUGR Fetal Ultrasounds or other Referrals:  Referred to Materal Fetal Medicine  Maternal Substance Abuse:  No Significant Maternal Medications:  None Significant Maternal Lab Results: Group B Strep negative  Results for orders placed or performed during the hospital encounter of 11/17/18 (from the past 24 hour(s))  SARS Coronavirus 2 (CEPHEID - Performed in [place] hospital lab), [facility] Order   Collection Time: 11/17/18  7:33 AM   Specimen: Nasopharyngeal Swab  Result Value Ref Range   SARS Coronavirus 2 NEGATIVE NEGATIVE  CBC   Collection Time: 11/17/18  8:03 AM  Result Value Ref Range   WBC 11.0 (H) 4.0 - 10.5 K/uL   RBC 4.60 3.87 - 5.11 MIL/uL   Hemoglobin 12.8 12.0 - 15.0 g/dL   HCT 39.5 36.0 - 46.0 %   MCV 85.9 80.0 - 100.0 fL   MCH 27.8 26.0 - 34.0 pg   MCHC 32.4 30.0 - 36.0 g/dL   RDW 14.7 11.5 - 15.5 %   Platelets 381 150 - 400 K/uL   nRBC 0.0 0.0 - 0.2 %  Type and screen   Collection Time: 11/17/18  8:03 AM  Result Value Ref Range   ABO/RH(D) A POS    Antibody Screen NEG    Sample Expiration      11/20/2018,2359 Performed at [facility], [place]. [address], [place], [place] [address]     Patient Active Problem List   Diagnosis Date Noted  . IUGR (intrauterine growth restriction) affecting care of mother 11/17/2018  . IUGR (intrauterine growth restriction) affecting care of mother, third trimester, fetus 1 11/10/2018  . Uterine size date discrepancy pregnancy, third trimester 10/08/2018  . Supervision of other normal pregnancy, antepartum 05/11/2018  . Low back pain 08/12/2013  . Neuropathic pain 08/12/2013  . Idiopathic scoliosis 09/25/2012    Assessment: Kelsey Davies is a 28 y.o. G2P1001 at [redacted]w[redacted]d here for IOL for IUGR.   #Labor: Induction. Cat II strip at admission, will start induction with FB. Placed at 9373 without complications.  #Pain: Plans for epidural  #FWB: Now Cat I   #ID:  GBS neg  #MOF: Bottle  #MOC:Depo  #Circ:  Declines   Melina Schools 11/17/2018, 10:06 AM

## 2018-11-17 NOTE — Anesthesia Procedure Notes (Signed)
Epidural Patient location during procedure: OB Start time: 11/17/2018 12:49 PM End time: 11/17/2018 1:52 PM  Staffing Anesthesiologist: Janeece Riggers, MD  Preanesthetic Checklist Completed: patient identified, site marked, surgical consent, pre-op evaluation, timeout performed, IV checked, risks and benefits discussed and monitors and equipment checked  Epidural Patient position: sitting Prep: site prepped and draped and DuraPrep Patient monitoring: continuous pulse ox and blood pressure Approach: midline Location: L2-L3 Injection technique: LOR air  Needle:  Needle type: Tuohy  Needle gauge: 17 G Needle length: 9 cm and 9 Needle insertion depth: 4 cm Catheter type: closed end flexible Catheter size: 19 Gauge Catheter at skin depth: 9 cm Test dose: negative  Assessment Events: blood not aspirated, injection not painful, no injection resistance, negative IV test and no paresthesia

## 2018-11-17 NOTE — Progress Notes (Signed)
OB/GYN Faculty Practice: Labor Progress Note  Subjective: Patient doing well without complaints.  Objective: BP 98/62   Pulse 99   Temp 98.2 F (36.8 C) (Oral)   Resp 17   Ht 4\' 11"  (1.499 m)   Wt 69.4 kg   LMP 02/17/2018 (Exact Date)   SpO2 99%   BMI 30.90 kg/m  Gen: well appearing, NAD Cervical exam: 7/80/-2  FHT: baseline 140bmp, +accels, mod variability, +early decels, no lates/variables Toco: q1-3 minutes  Assessment and Plan: 28 y.o. G2P1001 [redacted]w[redacted]d admitted for IOL-IUGR  Labor: progressing well --pit @6 , started at 1540 --AROM with moderate clear fluid @1730 , IUPC placed without difficulty --pain control: CSE  Fetal Well-Being: EFW 2613 (6%ile). Cephalic by BSUS.  -- Category I - continuous fetal monitoring  -- GBS negative   Corliss Blacker, PGY-III Family medicine  5:31 PM

## 2018-11-17 NOTE — MAU Note (Signed)
Covid swab collected. Pt asymptomatic. Tolerated well.

## 2018-11-17 NOTE — Discharge Summary (Signed)
Postpartum Discharge Summary     Patient Name: Kelsey Davies DOB: 21-Aug-1990 MRN: [id number]  Date of admission: 11/17/2018 Delivering Provider: Aviva SignsWILLIAMS, Ralene Gasparyan L   Date of discharge: 11/19/2018  Admitting diagnosis: pregnancy Intrauterine pregnancy: 6730w0d     Secondary diagnosis:  Active Problems:   IUGR (intrauterine growth restriction) affecting care of mother   Vaginal delivery   Close Exposure to Covid-19 Virus  Additional problems: Close Exposure to COVID person at home (son had it and recovered 2 wks ago, husb has it now)     Discharge diagnosis: Term Pregnancy Delivered and IUGR                                                                                                Post partum procedures:none  Augmentation: AROM and Pitocin  Complications: None  Hospital course:  Induction of Labor With Vaginal Delivery     28 y.o. yo G2P1001 at 530w0d was admitted in no labor  on 11/17/2018. Patient had an uncomplicated labor course as follows:  AROM was performed Labor was not progressing so Pitocin was started She received an epidural for pain management  Membrane Rupture Time/Date: 5:27 PM ,11/17/2018   Intrapartum Procedures: Episiotomy: None [1]                                         Lacerations:  None [1]  Patient had a delivery of a Viable infant. 11/17/2018  Information for the patient's newborn:  Kelsey Davies, Kelsey Davies [[id number]][[id number]]  Delivery Method: Vaginal, Spontaneous(Filed from Delivery Summary)     Pateint had an uncomplicated postpartum course.  She is ambulating, tolerating a regular diet, passing flatus, and urinating well. Patient is discharged home in stable condition on 11/19/18.   Magnesium Sulfate recieved: No BMZ received: No  Physical exam  Vitals:   11/18/18 1135 11/18/18 1529 11/18/18 1955 11/19/18 0524  BP: 108/70 140/70 102/64 (!) 88/58  Pulse: 91 91 81 79  Resp: 17 16 16 16   Temp: 98.5 F (36.9 C) 98.5 F (36.9 C) 97.9 F (36.6 C) 97.8  F (36.6 C)  TempSrc: Oral Oral Oral Oral  SpO2:   100% 99%  Weight:      Height:       General: alert, cooperative and no distress Lochia: appropriate Uterine Fundus: firm Incision: N/A DVT Evaluation: No evidence of DVT seen on physical exam. Labs: Lab Results  Component Value Date   WBC 11.0 (H) 11/17/2018   HGB 12.8 11/17/2018   HCT 39.5 11/17/2018   MCV 85.9 11/17/2018   PLT 381 11/17/2018   CMP Latest Ref Rng & Units 12/18/2015  Glucose 65 - 99 mg/dL 81  BUN 7 - 25 mg/dL 11  Creatinine 7.820.50 - 9.561.10 mg/dL 2.130.73  Sodium 086135 - 578146 mmol/L 140  Potassium 3.5 - 5.3 mmol/L 4.4  Chloride 98 - 110 mmol/L 102  CO2 20 - 31 mmol/L 28  Calcium 8.6 - 10.2 mg/dL 9.4  Total Protein 6.1 - 8.1 g/dL 7.1  Total Bilirubin 0.2 - 1.2 mg/dL 0.3  Alkaline Phos 33 - 115 U/L 96  AST 10 - 30 U/L 17  ALT 6 - 29 U/L 21    Discharge instruction: per After Visit Summary and "Baby and Me Booklet".  After visit meds:  Allergies as of 11/19/2018   No Known Allergies     Medication List    STOP taking these medications   Comfort Fit Maternity Supp Lg Misc     TAKE these medications   docusate sodium 100 MG capsule Commonly known as: COLACE Take 1 capsule (100 mg total) by mouth 2 (two) times daily.   ibuprofen 600 MG tablet Commonly known as: ADVIL Take 1 tablet (600 mg total) by mouth every 6 (six) hours.   PrePLUS 27-1 MG Tabs Take 1 tablet by mouth daily.       Diet: routine diet  Activity: Advance as tolerated. Pelvic rest for 6 weeks.   Outpatient follow up:4 weeks Follow up Appt:No future appointments. Follow up Visit: [place] [facility] Follow up.   Specialty: Obstetrics and Gynecology Contact information: [address] [place], [place] [id number] [place] [id number] [phone]           [facility] Please schedule this patient for Postpartum visit in: 4 weeks with the following  provider: Any provider For C/S patients schedule nurse incision check in weeks 2 weeks: no High risk pregnancy complicated by: IUGR Delivery mode:  SVD Anticipated Birth Control:  Depo PP Procedures needed: none  Schedule Integrated BH visit: no      Newborn Data: Live born female  Birth Weight:   APGAR: 10, 9  Newborn Delivery   Birth date/time: 11/17/2018 21:15:00 Delivery type: Vaginal, Spontaneous      Baby Feeding: Breast Disposition:home with mother                        Seen by pediatrician to arrange followup    11/19/2018 Hansel Feinstein, CNM

## 2018-11-18 NOTE — Anesthesia Preprocedure Evaluation (Signed)
Anesthesia Evaluation  Patient identified by MRN, date of birth, ID band Patient awake    Reviewed: Allergy & Precautions, Patient's Chart, lab work & pertinent test results  History of Anesthesia Complications Negative for: history of anesthetic complications  Airway Mallampati: II  TM Distance: >3 FB Neck ROM: Full    Dental no notable dental hx.    Pulmonary neg pulmonary ROS,    Pulmonary exam normal        Cardiovascular negative cardio ROS Normal cardiovascular exam     Neuro/Psych negative neurological ROS     GI/Hepatic negative GI ROS, Neg liver ROS,   Endo/Other  negative endocrine ROS  Renal/GU negative Renal ROS     Musculoskeletal negative musculoskeletal ROS (+)   Abdominal   Peds  Hematology negative hematology ROS (+)   Anesthesia Other Findings Day of surgery medications reviewed with the patient.  Reproductive/Obstetrics (+) Pregnancy                             Anesthesia Physical Anesthesia Plan  ASA: II  Anesthesia Plan: Epidural   Post-op Pain Management:    Induction:   PONV Risk Score and Plan: Treatment may vary due to age or medical condition  Airway Management Planned: Natural Airway  Additional Equipment:   Intra-op Plan:   Post-operative Plan:   Informed Consent: I have reviewed the patients History and Physical, chart, labs and discussed the procedure including the risks, benefits and alternatives for the proposed anesthesia with the patient or authorized representative who has indicated his/her understanding and acceptance.       Plan Discussed with:   Anesthesia Plan Comments:         Anesthesia Quick Evaluation  

## 2018-11-18 NOTE — Progress Notes (Signed)
POSTPARTUM PROGRESS NOTE  Subjective: Kelsey Davies is a 28 y.o. S0Y3016 PPD#1 s/p SVD at [redacted]w[redacted]d.  She reports she doing well. No acute events overnight. She denies any problems with ambulating, voiding or po intake. Denies nausea or vomiting. She has passed flatus. Pain is well controlled.  Lochia is improving.  Objective: Blood pressure 100/66, pulse 83, temperature 97.7 F (36.5 C), temperature source Oral, resp. rate 18, height 4\' 11"  (1.499 m), weight 69.4 kg, last menstrual period 02/17/2018, SpO2 100 %, unknown if currently breastfeeding.  Physical Exam:  General: alert, cooperative and no distress Chest: no respiratory distress Abdomen: soft, non-tender  Uterine Fundus: firm, appropriately tender Extremities: No calf swelling or tenderness  no edema  Recent Labs    11/17/18 0803  HGB 12.8  HCT 39.5    Assessment/Plan: Laraya Pestka is a 28 y.o. [id number] PPD#1 s/p SVD at [redacted]w[redacted]d for IOL-IUGR.  Routine Postpartum Care: Doing well, pain well-controlled.  -- Continue routine care, lactation support  -- Contraception: depo -- Feeding: bottle  Dispo: Plan for discharge likely tomorrow.Corliss Blacker, [facility]

## 2018-11-18 NOTE — Anesthesia Postprocedure Evaluation (Signed)
Anesthesia Post Note  Patient: Kelsey Davies  Procedure(s) Performed: AN AD HOC LABOR EPIDURAL     Patient location during evaluation: Mother Baby Anesthesia Type: Epidural Level of consciousness: awake and alert Pain management: pain level controlled Vital Signs Assessment: post-procedure vital signs reviewed and stable Respiratory status: spontaneous breathing, nonlabored ventilation and respiratory function stable Cardiovascular status: stable Postop Assessment: no headache, no backache and epidural receding Anesthetic complications: no    Last Vitals:  Vitals:   11/18/18 0019 11/18/18 0500  BP: 112/75 100/66  Pulse: (!) 108 83  Resp: 18   Temp: 37.3 C 36.5 C  SpO2: 99% 100%    Last Pain:  Vitals:   11/18/18 0500  TempSrc: Oral  PainSc: 5    Pain Goal:                   [employer]

## 2018-11-18 NOTE — Progress Notes (Signed)
Covid precautions taken. Patient is doing well. She understands good english. She does not wish to have any interpreter. Patient states she is not dizzy and she her bleeding is doing well. Plan of care told to patient. Patient and support person told to call out if she needs any medicine or has any questions. Patient and support person both understood and had no questions.

## 2018-11-19 DIAGNOSIS — Z20822 Contact with and (suspected) exposure to covid-19: Secondary | ICD-10-CM | POA: Diagnosis present

## 2018-11-19 DIAGNOSIS — Z20828 Contact with and (suspected) exposure to other viral communicable diseases: Secondary | ICD-10-CM | POA: Diagnosis present

## 2018-11-19 MED ORDER — IBUPROFEN 600 MG PO TABS: 600.0000 mg | ORAL_TABLET | Freq: Four times a day (QID) | ORAL | 0 refills | Status: DC

## 2018-11-19 MED ORDER — MEDROXYPROGESTERONE ACETATE 150 MG/ML IM SUSP
150.0000 mg | Freq: Once | INTRAMUSCULAR | Status: AC
  Administered 2018-11-19: 150 mg via INTRAMUSCULAR
  Filled 2018-11-19: qty 1

## 2018-11-19 NOTE — Discharge Instructions (Signed)
COVID-19: How to Protect Yourself and Others Know how it spreads  There is currently no vaccine to prevent coronavirus disease 2019 (COVID-19).  The best way to prevent illness is to avoid being exposed to this virus.  The virus is thought to spread mainly from person-to-person. ? Between people who are in close contact with one another (within about 6 feet). ? Through respiratory droplets produced when an infected person coughs, sneezes or talks. ? These droplets can land in the mouths or noses of people who are nearby or possibly be inhaled into the lungs. ? Some recent studies have suggested that COVID-19 may be spread by people who are not showing symptoms. Everyone should Clean your hands often  Wash your hands often with soap and water for at least 20 seconds especially after you have been in a public place, or after blowing your nose, coughing, or sneezing.  If soap and water are not readily available, use a hand sanitizer that contains at least 60% alcohol. Cover all surfaces of your hands and rub them together until they feel dry.  Avoid touching your eyes, nose, and mouth with unwashed hands. Avoid close contact  Stay home if you are sick.  Avoid close contact with people who are sick.  Put distance between yourself and other people. ? Remember that some people without symptoms may be able to spread virus. ? This is especially important for people who are at higher risk of getting very [email or web address] Cover your mouth and nose with a cloth face cover when around others  You could spread COVID-19 to others even if you do not feel sick.  Everyone should wear a cloth face cover when they have to go out in public, for example to the grocery store or to pick up other necessities. ? Cloth face coverings should not be placed on young children under age 18, anyone who has trouble breathing, or is unconscious,  incapacitated or otherwise unable to remove the mask without assistance.  The cloth face cover is meant to protect other people in case you are infected.  Do NOT use a facemask meant for a Dietitian.  Continue to keep about 6 feet between yourself and others. The cloth face cover is not a substitute for social distancing. Cover coughs and sneezes  If you are in a private setting and do not have on your cloth face covering, remember to always cover your mouth and nose with a tissue when you cough or sneeze or use the inside of your elbow.  Throw used tissues in the trash.  Immediately wash your hands with soap and water for at least 20 seconds. If soap and water are not readily available, clean your hands with a hand sanitizer that contains at least 60% alcohol. Clean and disinfect  Clean AND disinfect frequently touched surfaces daily. This includes tables, doorknobs, light switches, countertops, handles, desks, phones, keyboards, toilets, faucets, and sinks. [email or web address]  If surfaces are dirty, clean them: Use detergent or soap and water prior to disinfection.  Then, use a household disinfectant. You can see a list of EPA-registered household disinfectants here. [email or web address].[email or web address] 08/25/2018 This information is not intended to replace advice given to you by your health care provider. Make sure you discuss any questions you have with your health care provider. Document Released: 08/04/2018 Document Revised: 09/02/2018 Document Reviewed: 08/04/2018 Elsevier Patient Education  [place]. Medroxyprogesterone injection [Contraceptive] What is this medicine? MEDROXYPROGESTERONE (me DROX ee proe JES te rone) contraceptive injections prevent  pregnancy. They provide effective birth control for 3 months. Depo-subQ Provera 104 is also used for treating pain related to endometriosis. This medicine may be used  for other purposes; ask your health care provider or pharmacist if you have questions. COMMON BRAND NAME(S): Depo-Provera, Depo-subQ Provera 104 What should I tell my health care provider before I take this medicine? They need to know if you have any of these conditions:  frequently drink alcohol  asthma  blood vessel disease or a history of a blood clot in the lungs or legs  bone disease such as osteoporosis  breast cancer  diabetes  eating disorder (anorexia nervosa or bulimia)  high blood pressure  HIV infection or AIDS  kidney disease  liver disease  mental depression  migraine  seizures (convulsions)  stroke  tobacco smoker  vaginal bleeding  an unusual or allergic reaction to medroxyprogesterone, other hormones, medicines, foods, dyes, or preservatives  pregnant or trying to get pregnant  breast-feeding How should I use this medicine? Depo-Provera Contraceptive injection is given into a muscle. Depo-subQ Provera 104 injection is given under the skin. These injections are given by a health care professional. You must not be pregnant before getting an injection. The injection is usually given during the first 5 days after the start of a menstrual period or 6 weeks after delivery of a baby. Talk to your pediatrician regarding the use of this medicine in children. Special care may be needed. These injections have been used in female children who have started having menstrual periods. Overdosage: If you think you have taken too much of this medicine contact a poison control center or emergency room at once. NOTE: This medicine is only for you. Do not share this medicine with others. What if I miss a dose? Try not to miss a dose. You must get an injection once every 3 months to maintain birth control. If you cannot keep an appointment, call and reschedule it. If you wait longer than 13 weeks between Depo-Provera contraceptive injections or longer than 14 weeks  between Depo-subQ Provera 104 injections, you could get pregnant. Use another method for birth control if you miss your appointment. You may also need a pregnancy test before receiving another injection. What may interact with this medicine? Do not take this medicine with any of the following medications:  bosentan This medicine may also interact with the following medications:  aminoglutethimide  antibiotics or medicines for infections, especially rifampin, rifabutin, rifapentine, and griseofulvin  aprepitant  barbiturate medicines such as phenobarbital or primidone  bexarotene  carbamazepine  medicines for seizures like ethotoin, felbamate, oxcarbazepine, phenytoin, topiramate  modafinil  St. John's wort This list may not describe all possible interactions. Give your health care provider a list of all the medicines, herbs, non-prescription drugs, or dietary supplements you use. Also tell them if you smoke, drink alcohol, or use illegal drugs. Some items may interact with your medicine. What should I watch for while using this medicine? This drug does not protect you against HIV infection (AIDS) or other sexually transmitted diseases. Use of this product may cause you to lose calcium from your bones. Loss of calcium may cause weak bones (osteoporosis). Only use this product for more than 2 years if other forms of birth control are not right for you. The longer you use this product for birth control the more likely you will be at risk for weak bones. Ask your health care professional how you can keep strong bones. You may have  a change in bleeding pattern or irregular periods. Many females stop having periods while taking this drug. If you have received your injections on time, your chance of being pregnant is very low. If you think you may be pregnant, see your health care professional as soon as possible. Tell your health care professional if you want to get pregnant within the next  year. The effect of this medicine may last a long time after you get your last injection. What side effects may I notice from receiving this medicine? Side effects that you should report to your doctor or health care professional as soon as possible:  allergic reactions like skin rash, itching or hives, swelling of the face, lips, or tongue  breast tenderness or discharge  breathing problems  changes in vision  depression  feeling faint or lightheaded, falls  fever  pain in the abdomen, chest, groin, or leg  problems with balance, talking, walking  unusually weak or tired  yellowing of the eyes or skin Side effects that usually do not require medical attention (report to your doctor or health care professional if they continue or are bothersome):  acne  fluid retention and swelling  headache  irregular periods, spotting, or absent periods  temporary pain, itching, or skin reaction at site where injected  weight gain This list may not describe all possible side effects. Call your doctor for medical advice about side effects. You may report side effects to FDA at 1-800-FDA-1088. Where should I keep my medicine? This does not apply. The injection will be given to you by a health care professional. NOTE: This sheet is a summary. It may not cover all possible information. If you have questions about this medicine, talk to your doctor, pharmacist, or health care provider.  2020 Elsevier/Gold Standard (2008-04-29 18:37:56) Postpartum Care After Vaginal Delivery This sheet gives you information about how to care for yourself from the time you deliver your baby to up to 6-12 weeks after delivery (postpartum period). Your health care provider may also give you more specific instructions. If you have problems or questions, contact your health care provider. Follow these instructions at home: Vaginal bleeding  It is normal to have vaginal bleeding (lochia) after delivery. Wear a  sanitary pad for vaginal bleeding and discharge. ? During the first week after delivery, the amount and appearance of lochia is often similar to a menstrual period. ? Over the next few weeks, it will gradually decrease to a dry, yellow-brown discharge. ? For most women, lochia stops completely by 4-6 weeks after delivery. Vaginal bleeding can vary from woman to woman.  Change your sanitary pads frequently. Watch for any changes in your flow, such as: ? A sudden increase in volume. ? A change in color. ? Large blood clots.  If you pass a blood clot from your vagina, save it and call your health care provider to discuss. Do not flush blood clots down the toilet before talking with your health care provider.  Do not use tampons or douches until your health care provider says this is safe.  If you are not breastfeeding, your period should return 6-8 weeks after delivery. If you are feeding your child breast milk only (exclusive breastfeeding), your period may not return until you stop breastfeeding. Perineal care  Keep the area between the vagina and the anus (perineum) clean and dry as told by your health care provider. Use medicated pads and pain-relieving sprays and creams as directed.  If you had a  cut in the perineum (episiotomy) or a tear in the vagina, check the area for signs of infection until you are healed. Check for: ? More redness, swelling, or pain. ? Fluid or blood coming from the cut or tear. ? Warmth. ? Pus or a bad smell.  You may be given a squirt bottle to use instead of wiping to clean the perineum area after you go to the bathroom. As you start healing, you may use the squirt bottle before wiping yourself. Make sure to wipe gently.  To relieve pain caused by an episiotomy, a tear in the vagina, or swollen veins in the anus (hemorrhoids), try taking a warm sitz bath 2-3 times a day. A sitz bath is a warm water bath that is taken while you are sitting down. The water should  only come up to your hips and should cover your buttocks. Breast care  Within the first few days after delivery, your breasts may feel heavy, full, and uncomfortable (breast engorgement). Milk may also leak from your breasts. Your health care provider can suggest ways to help relieve the discomfort. Breast engorgement should go away within a few days.  If you are breastfeeding: ? Wear a bra that supports your breasts and fits you well. ? Keep your nipples clean and dry. Apply creams and ointments as told by your health care provider. ? You may need to use breast pads to absorb milk that leaks from your breasts. ? You may have uterine contractions every time you breastfeed for up to several weeks after delivery. Uterine contractions help your uterus return to its normal size. ? If you have any problems with breastfeeding, work with your health care provider or Advertising copywriterlactation consultant.  If you are not breastfeeding: ? Avoid touching your breasts a lot. Doing this can make your breasts produce more milk. ? Wear a good-fitting bra and use cold packs to help with swelling. ? Do not squeeze out (express) milk. This causes you to make more milk. Intimacy and sexuality  Ask your health care provider when you can engage in sexual activity. This may depend on: ? Your risk of infection. ? How fast you are healing. ? Your comfort and desire to engage in sexual activity.  You are able to get pregnant after delivery, even if you have not had your period. If desired, talk with your health care provider about methods of birth control (contraception). Medicines  Take over-the-counter and prescription medicines only as told by your health care provider.  If you were prescribed an antibiotic medicine, take it as told by your health care provider. Do not stop taking the antibiotic even if you start to feel better. Activity  Gradually return to your normal activities as told by your health care provider. Ask  your health care provider what activities are safe for you.  Rest as much as possible. Try to rest or take a nap while your baby is sleeping. Eating and drinking   Drink enough fluid to keep your urine pale yellow.  Eat high-fiber foods every day. These may help prevent or relieve constipation. High-fiber foods include: ? Whole grain cereals and breads. ? Brown rice. ? Beans. ? Fresh fruits and vegetables.  Do not try to lose weight quickly by cutting back on calories.  Take your prenatal vitamins until your postpartum checkup or until your health care provider tells you it is okay to stop. Lifestyle  Do not use any products that contain nicotine or tobacco, such as  cigarettes and e-cigarettes. If you need help quitting, ask your health care provider.  Do not drink alcohol, especially if you are breastfeeding. General instructions  Keep all follow-up visits for you and your baby as told by your health care provider. Most women visit their health care provider for a postpartum checkup within the first 3-6 weeks after delivery. Contact a health care provider if:  You feel unable to cope with the changes that your child brings to your life, and these feelings do not go away.  You feel unusually sad or worried.  Your breasts become red, painful, or hard.  You have a fever.  You have trouble holding urine or keeping urine from leaking.  You have little or no interest in activities you used to enjoy.  You have not breastfed at all and you have not had a menstrual period for 12 weeks after delivery.  You have stopped breastfeeding and you have not had a menstrual period for 12 weeks after you stopped breastfeeding.  You have questions about caring for yourself or your baby.  You pass a blood clot from your vagina. Get help right away if:  You have chest pain.  You have difficulty breathing.  You have sudden, severe leg pain.  You have severe pain or cramping in your  lower abdomen.  You bleed from your vagina so much that you fill more than one sanitary pad in one hour. Bleeding should not be heavier than your heaviest period.  You develop a severe headache.  You faint.  You have blurred vision or spots in your vision.  You have bad-smelling vaginal discharge.  You have thoughts about hurting yourself or your baby. If you ever feel like you may hurt yourself or others, or have thoughts about taking your own life, get help right away. You can go to the nearest emergency department or call:  Your local emergency services (911 in the [place]  A suicide crisis helpline, such as the National Suicide Prevention Lifeline at (660)553-76791-620-068-7074. This is open 24 hours a day. Summary  The period of time right after you deliver your newborn up to 6-12 weeks after delivery is called the postpartum period.  Gradually return to your normal activities as told by your health care provider.  Keep all follow-up visits for you and your baby as told by your health care provider. This information is not intended to replace advice given to you by your health care provider. Make sure you discuss any questions you have with your health care provider. Document Released: 02/03/2007 Document Revised: 04/11/2017 Document Reviewed: 01/20/2017 Elsevier Patient Education  2020 [employer]

## 2018-11-23 ENCOUNTER — Encounter: Payer: Medicaid Other | Admitting: Advanced Practice Midwife

## 2018-11-26 ENCOUNTER — Encounter: Payer: Medicaid Other | Admitting: Obstetrics and Gynecology

## 2018-11-26 ENCOUNTER — Other Ambulatory Visit: Payer: Medicaid Other

## 2018-12-16 ENCOUNTER — Telehealth: Payer: Self-pay | Admitting: Obstetrics and Gynecology

## 2018-12-16 NOTE — Telephone Encounter (Signed)
Attempted to contact patient w/ Nepali interpreter ID# [phone] about her appointment on 8/27 @ 8:55. No answer, interpreter left voicemail instructing patient that the appointment is a virtual visit. Patient instructed to download the [facility] app if not already done so. Patient instructed to give the office a call with any concerns.

## 2018-12-17 ENCOUNTER — Other Ambulatory Visit: Payer: Self-pay

## 2018-12-17 ENCOUNTER — Ambulatory Visit: Payer: Medicaid Other | Admitting: Obstetrics and Gynecology

## 2018-12-17 NOTE — Progress Notes (Signed)
Patient did not keep appointment today.    Lezlie Lye, NP 12/17/2018 9:35 AM

## 2018-12-17 NOTE — Progress Notes (Signed)
@  906am per interpreter phone kept ringing and stated the number is unavailable.  @0910  called the number without the interpreter and number is unavailable cannot leave a message.

## 2019-01-11 ENCOUNTER — Encounter: Payer: Self-pay | Admitting: Nurse Practitioner

## 2019-01-12 ENCOUNTER — Other Ambulatory Visit: Payer: Self-pay

## 2019-01-12 ENCOUNTER — Ambulatory Visit (INDEPENDENT_AMBULATORY_CARE_PROVIDER_SITE_OTHER): Payer: Medicaid Other | Admitting: Nurse Practitioner

## 2019-01-12 DIAGNOSIS — Z3042 Encounter for surveillance of injectable contraceptive: Secondary | ICD-10-CM

## 2019-01-12 DIAGNOSIS — K5909 Other constipation: Secondary | ICD-10-CM

## 2019-01-12 NOTE — Progress Notes (Signed)
[employer] [phone]

## 2019-01-12 NOTE — Progress Notes (Signed)
TELEHEALTH VIRTUAL POSTPARTUM VISIT ENCOUNTER NOTE  I connected with client on 01/12/19 at  2:35 PM EDT by telephone at home and verified that I am speaking with the correct person using two identifiers. Interpreter on the phone for the entire visit.   I discussed the limitations, risks, security and privacy concerns of performing an evaluation and management service by telephone and the availability of in person appointments. I also discussed with the patient that there may be a patient responsible charge related to this service. The patient expressed understanding and agreed to proceed.  Appointment Date: 01/12/2019  [facility]N Clinic: [facility]  Chief Complaint:  Postpartum Visit  History of Present Illness: Telia Amundson is a 28 y.o. [place] [id number] (Patient's last menstrual period was 02/17/2018 (exact date).), seen for the above chief complaint. Her past medical history is significant for  Previous back pain and language barrier.  She is s/p normal spontaneous vaginal delivery on 11-17-18 at [redacted]w[redacted]d; she was discharged to home on 11-19-18 and had Depo before discharge.  Pregnancy complicated by IUGR, close exposure to Covid 19. Baby is doing well.  Complains of constipation.  Vaginal bleeding or discharge: some spotting Mode of feeding infant: Bottle Intercourse: No  Contraception: Depo-Provera has had first injection PP depression s/s: No .  Any bowel or bladder issues: constipation and takes stool softener BID.  BM ever 3-4 days. Pap smear: no abnormalities (date: 04-2018)  Review of Systems: Her 12 point review of systems is negative or as noted in the History of Present Illness.  Patient Active Problem List   Diagnosis Date Noted  . Erroneous encounter - disregard 12/17/2018  . Close Exposure to Covid-19 Virus 11/19/2018  . History of prior pregnancy with IUGR newborn 11/17/2018  . Vaginal delivery 11/17/2018  . IUGR (intrauterine growth restriction) affecting care of mother,  third trimester, fetus 1 11/10/2018  . Uterine size date discrepancy pregnancy, third trimester 10/08/2018  . Supervision of other normal pregnancy, antepartum 05/11/2018  . Low back pain 08/12/2013  . Neuropathic pain 08/12/2013  . Idiopathic scoliosis 09/25/2012    Medications Deshon Koslowski had no medications administered during this visit. Current Outpatient Medications  Medication Sig Dispense Refill  . docusate sodium (COLACE) 100 MG capsule Take 1 capsule (100 mg total) by mouth 2 (two) times daily. 60 capsule 0  . Prenatal Vit-Fe Fumarate-FA (PREPLUS) 27-1 MG TABS Take 1 tablet by mouth daily. 30 tablet 3  . ibuprofen (ADVIL) 600 MG tablet Take 1 tablet (600 mg total) by mouth every 6 (six) hours. (Patient not taking: Reported on 01/12/2019) 30 tablet 0   No current facility-administered medications for this visit.     Allergies Patient has no known allergies.  Physical Exam:  General:  Alert, oriented and cooperative.   Mental Status: Normal mood and affect perceived. Normal judgment and thought content.  Rest of physical exam deferred due to type of encounter  PP Depression Screening:   Edinburgh Postnatal Depression Scale - 01/12/19 1500      Edinburgh Postnatal Depression Scale:  In the Past 7 Days   I have been able to laugh and see the funny side of things.  0    I have looked forward with enjoyment to things.  0    I have blamed myself unnecessarily when things went wrong.  0    I have been anxious or worried for no good reason.  0    I have felt scared or panicky for no good reason.  0    Things have been getting on top of me.  0    I have been so unhappy that I have had difficulty sleeping.  0    I have felt sad or miserable.  0    I have been so unhappy that I have been crying.  0    The thought of harming myself has occurred to me.  0    Edinburgh Postnatal Depression Scale Total  0       Assessment:Patient is a 28 y.o. [id number] who is 8 weeks postpartum  from a normal spontaneous vaginal delivery.  She is doing well.   Plan: 1.  Postpartum visit Doing well Continue Depo and return in 4 weeks for next Depo shot. Advised to get a flu shot at her next visit.   RTC 4 weeks  I discussed the assessment and treatment plan with the patient. The patient was provided an opportunity to ask questions and all were answered. The patient agreed with the plan and demonstrated an understanding of the instructions.   The patient was advised to call back or seek an in-person evaluation/go to the ED for any concerning postpartum symptoms.  I provided 12 minutes of non-face-to-face time during this encounter.   Currie Paris, NP [facility] [employer], [facility]

## 2019-02-09 ENCOUNTER — Other Ambulatory Visit: Payer: Self-pay

## 2019-02-09 ENCOUNTER — Ambulatory Visit (INDEPENDENT_AMBULATORY_CARE_PROVIDER_SITE_OTHER): Payer: Medicaid Other

## 2019-02-09 DIAGNOSIS — Z3042 Encounter for surveillance of injectable contraceptive: Secondary | ICD-10-CM

## 2019-02-09 MED ORDER — MEDROXYPROGESTERONE ACETATE 150 MG/ML IM SUSP
150.0000 mg | Freq: Once | INTRAMUSCULAR | Status: AC
  Administered 2019-02-09: 14:00:00 150 mg via INTRAMUSCULAR

## 2019-02-09 NOTE — Progress Notes (Addendum)
Bryson Dames here for Depo-Provera  Injection.  Injection administered without complication. Patient will return in 3 months for next injection.  Verdell Carmine, RN 02/09/2019  2:01 PM   Chart reviewed for nurse visit. Agree with plan of care.   Virginia Rochester, NP 02/09/2019 2:44 PM

## 2019-02-14 ENCOUNTER — Encounter (HOSPITAL_COMMUNITY): Payer: Self-pay

## 2019-02-14 ENCOUNTER — Other Ambulatory Visit: Payer: Self-pay

## 2019-02-14 ENCOUNTER — Ambulatory Visit (HOSPITAL_COMMUNITY)
Admission: EM | Admit: 2019-02-14 | Discharge: 2019-02-14 | Disposition: A | Discharge status: Home / Self Care | Payer: Medicaid Other | Attending: Urgent Care | Admitting: Urgent Care

## 2019-02-14 DIAGNOSIS — M436 Torticollis: Secondary | ICD-10-CM

## 2019-02-14 DIAGNOSIS — M62838 Other muscle spasm: Secondary | ICD-10-CM | POA: Diagnosis not present

## 2019-02-14 DIAGNOSIS — M542 Cervicalgia: Secondary | ICD-10-CM

## 2019-02-14 MED ORDER — NAPROXEN 500 MG PO TABS: 500.0000 mg | ORAL_TABLET | Freq: Two times a day (BID) | ORAL | 0 refills | Status: DC

## 2019-02-14 MED ORDER — CYCLOBENZAPRINE HCL 5 MG PO TABS: 5.0000 mg | ORAL_TABLET | Freq: Three times a day (TID) | ORAL | 0 refills | Status: DC | PRN

## 2019-02-14 NOTE — ED Triage Notes (Signed)
Patient presents to Urgent Care with complaints of left shoulder pain since 2 days ago. Patient reports no known injury.

## 2019-02-14 NOTE — ED Provider Notes (Signed)
  MRN: [id number] DOB: 07-Feb-1991  Subjective:   Kelsey Davies is a 28 y.o. female presenting for 2-day history of cute onset persistent left-sided neck discomfort that radiates to the left shoulder.  Pain is constant, moderate to severe and has difficulty with turning her head and bending her neck.  Patient states that symptoms started when she woke up.  Denies any kind of fall, trauma, history of neck issues or car accidents.  She has a 55-month-old currently and has been taking care of her baby pretty consistently, she is not breast-feeding.  Sleep has been erratic.  Patient states that she is hydrating very well.  Has tried Tylenol with intermittent relief only.    No Known Allergies  Past Medical History:  Diagnosis Date  . Medical history non-contributory      Past Surgical History:  Procedure Laterality Date  . NO PAST SURGERIES      ROS  Objective:   Vitals: BP 111/69 (BP Location: Right Arm)   Pulse 94   Temp 98.2 F (36.8 C) (Oral)   Resp 16   SpO2 100%   Breastfeeding No   Physical Exam Constitutional:      General: She is not in acute distress.    Appearance: Normal appearance. She is well-developed. She is not ill-appearing.  HENT:     Head: Normocephalic and atraumatic.     Nose: Nose normal.     Mouth/Throat:     Mouth: Mucous membranes are moist.     Pharynx: Oropharynx is clear.  Eyes:     General: No scleral icterus.    Extraocular Movements: Extraocular movements intact.     Pupils: Pupils are equal, round, and reactive to light.  Neck:     Musculoskeletal: Decreased range of motion (Very limited rotation and flexion to the left). Pain with movement, torticollis and muscular tenderness (Left-sided paraspinal muscles extending across the entire left trapezius) present. No edema, erythema, injury or spinous process tenderness.   Cardiovascular:     Rate and Rhythm: Normal rate.  Pulmonary:     Effort: Pulmonary effort is normal.  Skin:    General:  Skin is warm and dry.  Neurological:     General: No focal deficit present.     Mental Status: She is alert and oriented to person, place, and time.  Psychiatric:        Mood and Affect: Mood normal.        Behavior: Behavior normal.      Assessment and Plan :   1. Trapezius muscle spasm   2. Neck pain   3. Torticollis     We will manage conservatively for torticollis and trapezius muscle spasm with NSAID and muscle relaxant, rest and modification of physical activity.  Anticipatory guidance provided.  Counseled patient on potential for adverse effects with medications prescribed/recommended today, ER and return-to-clinic precautions discussed, patient verbalized understanding.    Jaynee Eagles, PA-C 02/14/19 1233

## 2019-02-25 ENCOUNTER — Other Ambulatory Visit: Payer: Self-pay

## 2019-02-25 DIAGNOSIS — Z20822 Contact with and (suspected) exposure to covid-19: Secondary | ICD-10-CM

## 2019-02-26 LAB — NOVEL CORONAVIRUS, NAA: SARS-CoV-2, NAA: NOT DETECTED

## 2019-04-27 ENCOUNTER — Ambulatory Visit: Payer: Medicaid Other

## 2019-07-05 ENCOUNTER — Ambulatory Visit: Payer: Medicaid Other | Admitting: Nurse Practitioner

## 2019-08-03 ENCOUNTER — Encounter: Payer: Self-pay | Admitting: Nurse Practitioner

## 2019-08-03 ENCOUNTER — Ambulatory Visit: Payer: Medicaid Other | Attending: Nurse Practitioner | Admitting: Nurse Practitioner

## 2019-08-03 ENCOUNTER — Other Ambulatory Visit: Payer: Self-pay

## 2019-08-03 VITALS — BP 114/75 | HR 74 | Temp 97.7°F | Ht 59.0 in | Wt 151.0 lb

## 2019-08-03 DIAGNOSIS — H519 Unspecified disorder of binocular movement: Secondary | ICD-10-CM

## 2019-08-03 DIAGNOSIS — Z131 Encounter for screening for diabetes mellitus: Secondary | ICD-10-CM

## 2019-08-03 DIAGNOSIS — Z0000 Encounter for general adult medical examination without abnormal findings: Secondary | ICD-10-CM | POA: Diagnosis not present

## 2019-08-03 DIAGNOSIS — Z130 Encounter for screening for diseases of the blood and blood-forming organs and certain disorders involving the immune mechanism: Secondary | ICD-10-CM

## 2019-08-03 DIAGNOSIS — Z1329 Encounter for screening for other suspected endocrine disorder: Secondary | ICD-10-CM

## 2019-08-03 DIAGNOSIS — Z13228 Encounter for screening for other metabolic disorders: Secondary | ICD-10-CM

## 2019-08-03 DIAGNOSIS — Z13220 Encounter for screening for lipoid disorders: Secondary | ICD-10-CM

## 2019-08-03 NOTE — Progress Notes (Signed)
Assessment & Plan:  Kelsey Davies was seen today for annual exam.  Diagnoses and all orders for this visit:  Encounter for annual physical exam -     Hemoglobin A1c -     CBC -     CMP14+EGFR -     Lipid panel -     TSH  Screening for deficiency anemia -     CBC  Screening for metabolic disorder -     NWG95+AOZH  Encounter for screening for diabetes mellitus -     Hemoglobin A1c  Lipid screening -     Lipid panel  Thyroid disorder screening -     TSH  Eye movement abnormality -     Ambulatory referral to Ophthalmology    Patient has been counseled on age-appropriate routine health concerns for screening and prevention. These are reviewed and up-to-date. Referrals have been placed accordingly. Immunizations are up-to-date or declined.    Subjective:   Chief Complaint  Patient presents with  . Annual Exam    Pt. is here for a physical.    HPI Kelsey Davies 29 y.o. female presents to office today for annual physical..   Review of Systems  Constitutional: Negative for fever, malaise/fatigue and weight loss.  HENT: Negative.  Negative for nosebleeds.   Eyes: Negative.  Negative for blurred vision, double vision and photophobia.  Respiratory: Negative.  Negative for cough and shortness of breath.   Cardiovascular: Negative.  Negative for chest pain, palpitations and leg swelling.  Gastrointestinal: Negative.  Negative for heartburn, nausea and vomiting.  Genitourinary: Negative.   Musculoskeletal: Negative.  Negative for myalgias.  Skin: Negative.   Neurological: Negative.  Negative for dizziness, focal weakness, seizures and headaches.  Endo/Heme/Allergies: Negative.   Psychiatric/Behavioral: Negative.  Negative for suicidal ideas.    Past Medical History:  Diagnosis Date  . Medical history non-contributory     Past Surgical History:  Procedure Laterality Date  . NO PAST SURGERIES      Family History  Problem Relation Age of Onset  . Healthy Mother     . Rheum arthritis Maternal Grandmother   . Rheum arthritis Maternal Grandfather   . Asthma Paternal Grandmother   . Rheum arthritis Sister   . Rheum arthritis Brother   . Hearing loss Neg Hx     Social History Reviewed with no changes to be made today.   Outpatient Medications Prior to Visit  Medication Sig Dispense Refill  . cyclobenzaprine (FLEXERIL) 5 MG tablet Take 1 tablet (5 mg total) by mouth 3 (three) times daily as needed for muscle spasms. (Patient not taking: Reported on 08/03/2019) 30 tablet 0  . naproxen (NAPROSYN) 500 MG tablet Take 1 tablet (500 mg total) by mouth 2 (two) times daily. (Patient not taking: Reported on 08/03/2019) 30 tablet 0  . Prenatal Vit-Fe Fumarate-FA (PREPLUS) 27-1 MG TABS Take 1 tablet by mouth daily. (Patient not taking: Reported on 08/03/2019) 30 tablet 3   No facility-administered medications prior to visit.    No Known Allergies     Objective:    BP 114/75 (BP Location: Left Arm, Patient Position: Sitting, Cuff Size: Normal)   Pulse 74   Temp 97.7 F (36.5 C) (Temporal)   Ht 4' 11" (1.499 m)   Wt 151 lb (68.5 kg)   SpO2 98%   BMI 30.50 kg/m  Wt Readings from Last 3 Encounters:  08/03/19 151 lb (68.5 kg)  11/17/18 153 lb (69.4 kg)  10/30/18 153 lb 4.8 oz (69.5  kg)    Physical Exam Constitutional:      Appearance: She is well-developed.  HENT:     Head: Normocephalic and atraumatic.     Right Ear: External ear normal.     Left Ear: External ear normal.     Nose: Nose normal.     Mouth/Throat:     Lips: Pink.     Mouth: Mucous membranes are moist.     Dentition: Normal dentition.     Tongue: No lesions.     Palate: No mass.     Pharynx: No oropharyngeal exudate.  Eyes:     General: No scleral icterus.       Right eye: No discharge.     Extraocular Movements:     Right eye: Abnormal extraocular motion present.     Left eye: Abnormal extraocular motion present.     Conjunctiva/sclera: Conjunctivae normal.     Pupils:  Pupils are equal, round, and reactive to light.  Neck:     Thyroid: No thyromegaly.     Trachea: No tracheal deviation.  Cardiovascular:     Rate and Rhythm: Normal rate and regular rhythm.     Pulses:          Dorsalis pedis pulses are 1+ on the right side and 1+ on the left side.       Posterior tibial pulses are 1+ on the right side and 1+ on the left side.     Heart sounds: Normal heart sounds. No murmur. No friction rub.  Pulmonary:     Effort: Pulmonary effort is normal. No accessory muscle usage or respiratory distress.     Breath sounds: Normal breath sounds. No decreased breath sounds, wheezing, rhonchi or rales.  Chest:     Chest wall: No tenderness.     Breasts: Breasts are symmetrical.        Right: No inverted nipple, mass, nipple discharge, skin change or tenderness.        Left: No inverted nipple, mass, nipple discharge, skin change or tenderness.  Abdominal:     General: Bowel sounds are normal. There is no distension.     Palpations: Abdomen is soft. There is no mass.     Tenderness: There is no abdominal tenderness. There is no guarding or rebound.  Musculoskeletal:        General: No tenderness or deformity. Normal range of motion.     Cervical back: Normal range of motion and neck supple.  Lymphadenopathy:     Cervical: No cervical adenopathy.  Skin:    General: Skin is warm and dry.     Findings: No erythema.  Neurological:     Mental Status: She is alert and oriented to person, place, and time.     Cranial Nerves: No cranial nerve deficit.     Sensory: Sensation is intact.     Motor: Motor function is intact.     Coordination: Coordination is intact. Coordination normal.     Gait: Gait is intact.     Deep Tendon Reflexes: Reflexes are normal and symmetric.     Reflex Scores:      Patellar reflexes are 2+ on the right side and 2+ on the left side. Psychiatric:        Speech: Speech normal.        Behavior: Behavior normal.        Thought Content:  Thought content normal.        Judgment: Judgment normal.  Patient has been counseled extensively about nutrition and exercise as well as the importance of adherence with medications and regular follow-up. The patient was given clear instructions to go to ER or return to medical center if symptoms don't improve, worsen or new problems develop. The patient verbalized understanding.   Follow-up: Return if symptoms worsen or fail to improve.   Gildardo Pounds, FNP-BC [facility] [place], [place]   08/03/2019, 10:33 AM

## 2019-08-03 NOTE — Patient Instructions (Addendum)
Health Maintenance, Female Adopting a healthy lifestyle and getting preventive care are important in promoting health and wellness. Ask your health care provider about:  The right schedule for you to have regular tests and exams.  Things you can do on your own to prevent diseases and keep yourself healthy. What should I know about diet, weight, and exercise? Eat a healthy diet   Eat a diet that includes plenty of vegetables, fruits, low-fat dairy products, and lean protein.  Do not eat a lot of foods that are high in solid fats, added sugars, or sodium. Maintain a healthy weight Body mass index (BMI) is used to identify weight problems. It estimates body fat based on height and weight. Your health care provider can help determine your BMI and help you achieve or maintain a healthy weight. Get regular exercise Get regular exercise. This is one of the most important things you can do for your health. Most adults should:  Exercise for at least 150 minutes each week. The exercise should increase your heart rate and make you sweat (moderate-intensity exercise).  Do strengthening exercises at least twice a week. This is in addition to the moderate-intensity exercise.  Spend less time sitting. Even light physical activity can be beneficial. Watch cholesterol and blood lipids Have your blood tested for lipids and cholesterol at 27 years of age, then have this test every 5 years. Have your cholesterol levels checked more often if:  Your lipid or cholesterol levels are high.  You are older than 27 years of age.  You are at high risk for heart disease. What should I know about cancer screening? Depending on your health history and family history, you may need to have cancer screening at various ages. This may include screening for:  Breast cancer.  Cervical cancer.  Colorectal cancer.  Skin cancer.  Lung cancer. What should I know about heart disease, diabetes, and high blood  pressure? Blood pressure and heart disease  High blood pressure causes heart disease and increases the risk of stroke. This is more likely to develop in people who have high blood pressure readings, are of African descent, or are overweight.  Have your blood pressure checked: ? Every 3-5 years if you are 18-39 years of age. ? Every year if you are 40 years old or older. Diabetes Have regular diabetes screenings. This checks your fasting blood sugar level. Have the screening done:  Once every three years after age 40 if you are at a normal weight and have a low risk for diabetes.  More often and at a younger age if you are overweight or have a high risk for diabetes. What should I know about preventing infection? Hepatitis B If you have a higher risk for hepatitis B, you should be screened for this virus. Talk with your health care provider to find out if you are at risk for hepatitis B infection. Hepatitis C Testing is recommended for:  Everyone born from 1945 through 1965.  Anyone with known risk factors for hepatitis C. Sexually transmitted infections (STIs)  Get screened for STIs, including gonorrhea and chlamydia, if: ? You are sexually active and are younger than 27 years of age. ? You are older than 27 years of age and your health care provider tells you that you are at risk for this type of infection. ? Your sexual activity has changed since you were last screened, and you are at increased risk for chlamydia or gonorrhea. Ask your health care provider if   you are at risk.  Ask your health care provider about whether you are at high risk for HIV. Your health care provider may recommend a prescription medicine to help prevent HIV infection. If you choose to take medicine to prevent HIV, you should first get tested for HIV. You should then be tested every 3 months for as long as you are taking the medicine. Pregnancy  If you are about to stop having your period (premenopausal) and  you may become pregnant, seek counseling before you get pregnant.  Take 400 to 800 micrograms (mcg) of folic acid every day if you become pregnant.  Ask for birth control (contraception) if you want to prevent pregnancy. Osteoporosis and menopause Osteoporosis is a disease in which the bones lose minerals and strength with aging. This can result in bone fractures. If you are 65 years old or older, or if you are at risk for osteoporosis and fractures, ask your health care provider if you should:  Be screened for bone loss.  Take a calcium or vitamin D supplement to lower your risk of fractures.  Be given hormone replacement therapy (HRT) to treat symptoms of menopause. Follow these instructions at home: Lifestyle  Do not use any products that contain nicotine or tobacco, such as cigarettes, e-cigarettes, and chewing tobacco. If you need help quitting, ask your health care provider.  Do not use street drugs.  Do not share needles.  Ask your health care provider for help if you need support or information about quitting drugs. Alcohol use  Do not drink alcohol if: ? Your health care provider tells you not to drink. ? You are pregnant, may be pregnant, or are planning to become pregnant.  If you drink alcohol: ? Limit how much you use to 0-1 drink a day. ? Limit intake if you are breastfeeding.  Be aware of how much alcohol is in your drink. In the [place] one drink equals one 12 oz bottle of beer (355 mL), one 5 oz glass of wine (148 mL), or one 1 oz glass of hard liquor (44 mL). General instructions  Schedule regular health, dental, and eye exams.  Stay current with your vaccines.  Tell your health care provider if: ? You often feel depressed. ? You have ever been abused or do not feel safe at home. Summary  Adopting a healthy lifestyle and getting preventive care are important in promoting health and wellness.  Follow your health care provider's instructions about healthy  diet, exercising, and getting tested or screened for diseases.  Follow your health care provider's instructions on monitoring your cholesterol and blood pressure. This information is not intended to replace advice given to you by your health care provider. Make sure you discuss any questions you have with your health care provider. Document Revised: 04/01/2018 Document Reviewed: 04/01/2018 Elsevier Patient Education  2020 Elsevier Inc.  

## 2019-08-04 LAB — CMP14+EGFR
ALT: 53 IU/L — ABNORMAL HIGH (ref 0–32)
AST: 31 IU/L (ref 0–40)
Albumin/Globulin Ratio: 1.6 (ref 1.2–2.2)
Albumin: 4.1 g/dL (ref 3.9–5.0)
Alkaline Phosphatase: 97 IU/L (ref 39–117)
BUN/Creatinine Ratio: 8 — ABNORMAL LOW (ref 9–23)
BUN: 5 mg/dL — ABNORMAL LOW (ref 6–20)
Bilirubin Total: 0.4 mg/dL (ref 0.0–1.2)
CO2: 21 mmol/L (ref 20–29)
Calcium: 9.5 mg/dL (ref 8.7–10.2)
Chloride: 103 mmol/L (ref 96–106)
Creatinine, Ser: 0.62 mg/dL (ref 0.57–1.00)
GFR calc Af Amer: 144 mL/min/{1.73_m2} (ref >59)
GFR calc non Af Amer: 125 mL/min/{1.73_m2} (ref >59)
Globulin, Total: 2.5 g/dL (ref 1.5–4.5)
Glucose: 91 mg/dL (ref 65–99)
Potassium: 4.1 mmol/L (ref 3.5–5.2)
Sodium: 139 mmol/L (ref 134–144)
Total Protein: 6.6 g/dL (ref 6.0–8.5)

## 2019-08-04 LAB — CBC
Hematocrit: 39.6 % (ref 34.0–46.6)
Hemoglobin: 12.9 g/dL (ref 11.1–15.9)
MCH: 27.3 pg (ref 26.6–33.0)
MCHC: 32.6 g/dL (ref 31.5–35.7)
MCV: 84 fL (ref 79–97)
Platelets: 402 10*3/uL (ref 150–450)
RBC: 4.72 x10E6/uL (ref 3.77–5.28)
RDW: 13.9 % (ref 11.7–15.4)
WBC: 11.1 10*3/uL — ABNORMAL HIGH (ref 3.4–10.8)

## 2019-08-04 LAB — LIPID PANEL
Chol/HDL Ratio: 3.4 ratio (ref 0.0–4.4)
Cholesterol, Total: 137 mg/dL (ref 100–199)
HDL: 40 mg/dL (ref >39)
LDL Chol Calc (NIH): 80 mg/dL (ref 0–99)
Triglycerides: 87 mg/dL (ref 0–149)
VLDL Cholesterol Cal: 17 mg/dL (ref 5–40)

## 2019-08-04 LAB — HEMOGLOBIN A1C
Est. average glucose Bld gHb Est-mCnc: 114 mg/dL
Hgb A1c MFr Bld: 5.6 % (ref 4.8–5.6)

## 2019-08-04 LAB — TSH: TSH: 2.38 u[IU]/mL (ref 0.450–4.500)

## 2019-11-25 ENCOUNTER — Telehealth: Payer: Self-pay | Admitting: Nurse Practitioner

## 2019-11-25 NOTE — Telephone Encounter (Signed)
Copied from CRM [id number]. Topic: General - Call Back - No Documentation >> Nov 25, 2019 12:27 PM Randol Kern wrote: Pt is requesting a call back regarding her constipation. She is scheduled 12/15/2019 and has been advised to seek UC for urgent needs. Wants to speak to clinic for advice [phone]

## 2019-11-26 NOTE — Telephone Encounter (Signed)
Patient name and DOB verified.  Addressed sx's with patient.  States she eats lots of fiber and does exercise.  She also take Miralax regularly.   Patient c/o 6/10 abdominal pain. RUQ, constant. Taking Tylenol for discomfort. Does not drink ETOH.  Denies fever or diarrhea.  LMP- was 11/24/2019 Advised to go to [facility] for further evaluation.   Patient has an appointment scheduled on 12/15/2019

## 2019-12-15 ENCOUNTER — Other Ambulatory Visit: Payer: Self-pay

## 2019-12-15 ENCOUNTER — Encounter: Payer: Self-pay | Admitting: Physician Assistant

## 2019-12-15 ENCOUNTER — Ambulatory Visit: Payer: Medicaid Other | Attending: Physician Assistant | Admitting: Physician Assistant

## 2019-12-15 VITALS — BP 118/72 | HR 66 | Temp 97.5°F | Resp 18 | Wt 149.0 lb

## 2019-12-15 DIAGNOSIS — K5900 Constipation, unspecified: Secondary | ICD-10-CM | POA: Diagnosis not present

## 2019-12-15 DIAGNOSIS — Z789 Other specified health status: Secondary | ICD-10-CM | POA: Diagnosis not present

## 2019-12-15 NOTE — Progress Notes (Signed)
Patient ID: Kelsey Davies, female   DOB: 1991-04-09, 29 y.o.   MRN: [id number]   Kelsey Davies, is a 29 y.o. female  CMK:[id number]  [id number]:[id number]  DOB - Mar 26, 1991  Subjective:  Chief Complaint and HPI: Kelsey Davies is a 29 y.o. female here today for constipation for 1 year.  Has ~2 BMs per week.  This has been going on >1 year or since she had her baby about 1 year ago.  CPE in 07/2019 and labs including TSH and CBC WNL(also CMP, lipids, and A1C essentially normal).  No melena or hematochezia.  Abdomen is uncomfortable but not painful.  She denies fever or urinary s/sx.  No N/V/D.  Appetite is good.    Kelsey Davies is translating/interpreting  ROS:   Constitutional:  No f/c, No night sweats, No unexplained weight loss. EENT:  No vision changes, No blurry vision, No hearing changes. No mouth, throat, or ear problems.  Respiratory: No cough, No SOB Cardiac: No CP, no palpitations GI:  No abd pain, No N/V/D. +constipation GU: No Urinary s/sx Musculoskeletal: No joint pain Neuro: No headache, no dizziness, no motor weakness.  Skin: No rash Endocrine:  No polydipsia. No polyuria.  Psych: Denies SI/HI  No problems updated.  ALLERGIES: No Known Allergies  PAST MEDICAL HISTORY: Past Medical History:  Diagnosis Date   Medical history non-contributory     MEDICATIONS AT HOME: Prior to Admission medications   Not on File     Objective:  EXAM:   Vitals:   12/15/19 1548  BP: 118/72  Pulse: 66  Resp: 18  Temp: (!) 97.5 F (36.4 C)  TempSrc: Temporal  SpO2: 95%  Weight: 149 lb (67.6 kg)    General appearance : A&OX3. NAD. Non-toxic-appearing HEENT: Atraumatic and Normocephalic.  PERRLA. EOM intact.  Chest/Lungs:  Breathing-non-labored, Good air entry bilaterally, breath sounds normal without rales, rhonchi, or wheezing  CVS: S1 S2 regular, no murmurs, gallops, rubs  Abdomen: Bowel sounds present, Non tender and not distended with no gaurding, rigidity or  rebound. Extremities: Bilateral Lower Ext shows no edema, both legs are warm to touch with = pulse throughout Neurology:  CN II-XII grossly intact, Non focal.   Psych:  TP linear. J/I WNL. Normal speech. Appropriate eye contact and affect.  Skin:  No Rash  Data Review Lab Results  Component Value Date   HGBA1C 5.6 08/03/2019   HGBA1C 5.2 05/11/2018     Assessment & Plan   1. Constipation, unspecified constipation type Increase water and fiber intake.  Labs normal 07/2019 - Ambulatory referral to Gastroenterology  2. Language barrier [employer] interpreters used and additional time performing visit was required.    Patient have been counseled extensively about nutrition and exercise  Return if symptoms worsen or fail to improve.  The patient was given clear instructions to go to ER or return to medical center if symptoms don't improve, worsen or new problems develop. The patient verbalized understanding. The patient was told to call to get lab results if they haven't heard anything in the next week.     Georgian Co, PA-C [facility] [place], [place] [phone]   12/15/2019, 4:00 PM

## 2019-12-15 NOTE — Patient Instructions (Addendum)
Drink 80-100 ounces water daily   Constipation, Adult Constipation is when a person:  Poops (has a bowel movement) fewer times in a week than normal.  Has a hard time pooping.  Has poop that is dry, hard, or bigger than normal. Follow these instructions at home: Eating and drinking   Eat foods that have a lot of fiber, such as: ? Fresh fruits and vegetables. ? Whole grains. ? Beans.  Eat less of foods that are high in fat, low in fiber, or overly processed, such as: ? [place] fries. ? Hamburgers. ? Cookies. ? Candy. ? Soda.  Drink enough fluid to keep your pee (urine) clear or pale yellow. General instructions  Exercise regularly or as told by your doctor.  Go to the restroom when you feel like you need to poop. Do not hold it in.  Take over-the-counter and prescription medicines only as told by your doctor. These include any fiber supplements.  Do pelvic floor retraining exercises, such as: ? Doing deep breathing while relaxing your lower belly (abdomen). ? Relaxing your pelvic floor while pooping.  Watch your condition for any changes.  Keep all follow-up visits as told by your doctor. This is important. Contact a doctor if:  You have pain that gets worse.  You have a fever.  You have not pooped for 4 days.  You throw up (vomit).  You are not hungry.  You lose weight.  You are bleeding from the anus.  You have thin, pencil-like poop (stool). Get help right away if:  You have a fever, and your symptoms suddenly get worse.  You leak poop or have blood in your poop.  Your belly feels hard or bigger than normal (is bloated).  You have very bad belly pain.  You feel dizzy or you faint. This information is not intended to replace advice given to you by your health care provider. Make sure you discuss any questions you have with your health care provider. Document Revised: 03/21/2017 Document Reviewed: 09/27/2015 Elsevier Patient Education  2020  [employer].

## 2019-12-17 ENCOUNTER — Encounter: Payer: Self-pay | Admitting: Nurse Practitioner

## 2020-01-24 ENCOUNTER — Encounter: Payer: Self-pay | Admitting: Nurse Practitioner

## 2020-01-24 ENCOUNTER — Other Ambulatory Visit (INDEPENDENT_AMBULATORY_CARE_PROVIDER_SITE_OTHER): Payer: Medicaid Other

## 2020-01-24 ENCOUNTER — Ambulatory Visit (INDEPENDENT_AMBULATORY_CARE_PROVIDER_SITE_OTHER): Payer: Medicaid Other | Admitting: Nurse Practitioner

## 2020-01-24 VITALS — BP 100/70 | HR 96 | Ht 59.0 in | Wt 149.0 lb

## 2020-01-24 DIAGNOSIS — R1084 Generalized abdominal pain: Secondary | ICD-10-CM

## 2020-01-24 DIAGNOSIS — K5900 Constipation, unspecified: Secondary | ICD-10-CM

## 2020-01-24 LAB — CBC WITH DIFFERENTIAL/PLATELET
Basophils Absolute: 0.1 10*3/uL (ref 0.0–0.1)
Basophils Relative: 0.6 % (ref 0.0–3.0)
Eosinophils Absolute: 0.6 10*3/uL (ref 0.0–0.7)
Eosinophils Relative: 4.7 % (ref 0.0–5.0)
HCT: 39.3 % (ref 36.0–46.0)
Hemoglobin: 12.9 g/dL (ref 12.0–15.0)
Lymphocytes Relative: 20.5 % (ref 12.0–46.0)
Lymphs Abs: 2.4 10*3/uL (ref 0.7–4.0)
MCHC: 32.9 g/dL (ref 30.0–36.0)
MCV: 81.5 fl (ref 78.0–100.0)
Monocytes Absolute: 0.4 10*3/uL (ref 0.1–1.0)
Monocytes Relative: 3.6 % (ref 3.0–12.0)
Neutro Abs: 8.3 10*3/uL — ABNORMAL HIGH (ref 1.4–7.7)
Neutrophils Relative %: 70.6 % (ref 43.0–77.0)
Platelets: 425 10*3/uL — ABNORMAL HIGH (ref 150.0–400.0)
RBC: 4.82 Mil/uL (ref 3.87–5.11)
RDW: 13.6 % (ref 11.5–15.5)
WBC: 11.8 10*3/uL — ABNORMAL HIGH (ref 4.0–10.5)

## 2020-01-24 LAB — COMPREHENSIVE METABOLIC PANEL WITH GFR
ALT: 35 U/L (ref 0–35)
AST: 17 U/L (ref 0–37)
Albumin: 4.2 g/dL (ref 3.5–5.2)
Alkaline Phosphatase: 81 U/L (ref 39–117)
BUN: 7 mg/dL (ref 6–23)
CO2: 28 meq/L (ref 19–32)
Calcium: 9.1 mg/dL (ref 8.4–10.5)
Chloride: 103 meq/L (ref 96–112)
Creatinine, Ser: 0.57 mg/dL (ref 0.40–1.20)
GFR: 127.13 mL/min
Glucose, Bld: 106 mg/dL — ABNORMAL HIGH (ref 70–99)
Potassium: 3.8 meq/L (ref 3.5–5.1)
Sodium: 138 meq/L (ref 135–145)
Total Bilirubin: 0.4 mg/dL (ref 0.2–1.2)
Total Protein: 7.1 g/dL (ref 6.0–8.3)

## 2020-01-24 LAB — C-REACTIVE PROTEIN: CRP: 1.1 mg/dL (ref 0.5–20.0)

## 2020-01-24 MED ORDER — LINACLOTIDE 145 MCG PO CAPS: 145.0000 ug | ORAL_CAPSULE | Freq: Every day | ORAL | 1 refills | Status: DC

## 2020-01-24 NOTE — Patient Instructions (Signed)
If you are age 27 or older, your body mass index should be between 23-30. Your Body mass index is 30.09 kg/m. If this is out of the aforementioned range listed, please consider follow up with your Primary Care Provider.  If you are age 25 or younger, your body mass index should be between 19-25. Your Body mass index is 30.09 kg/m. If this is out of the aformentioned range listed, please consider follow up with your Primary Care Provider.    Your provider has requested that you go to the basement level for lab work before leaving today. Press "B" on the elevator. The lab is located at the first door on the left as you exit the elevator.   We have sent the following medications to your pharmacy for you to pick up at your convenience: Linzess 145 mcg take 1 tablet before breakfast Samples given of Linzess  4 boxes with 4 tablets each a total of 12 tablets  Take Miralax 1 capful mixed in 8 ounces of water at bed time for constipation as tolerated.  Call the office if symptoms worsen. Follow up in 4-6 weeks with Dr Lavon Paganini  Due to recent changes in healthcare laws, you may see the results of your imaging and laboratory studies on MyChart before your provider has had a chance to review them.  We understand that in some cases there may be results that are confusing or concerning to you. Not all laboratory results come back in the same time frame and the provider may be waiting for multiple results in order to interpret others.  Please give [place] 48 hours in order for your provider to thoroughly review all the results before contacting the office for clarification of your results.   Thank you for choosing [facility] Arnaldo Natal, CRNP  [phone]

## 2020-01-24 NOTE — Progress Notes (Signed)
01/24/2020 Kelsey Davies [id number] 12/18/1990   CHIEF COMPLAINT: Constipation   HISTORY OF PRESENT ILLNESS:  Kelsey Davies is a 29 year old female with a past medical history of constipation and a 1.5cm gallstone. She presents to our office today as referred by her primary care physician regarding constipation evaluation.  Labs 08/03/2019 showed TSH 2.38. WBC 11. ALT 53.  She complains of having constipation. She can go 3 to 7 days without passing a BM for the past year. She passes small formed stools twice weekly. No rectal bleeding or abdominal pain. She has central mid abdominal pain as well as pain to the sides of her abdomen which can last for 1 to 4 hours which comes and goes.  Her abdominal pain has awakened her from sleep on several occasions. No fever, sweats or chills. No weight loss. She took Miralax for 6 months without improvement, she stopped taking it 2 months ago. No N/V. No NSAID use. She drinks 7 glasses of water daily. She is a vegetarian.    CBC Latest Ref Rng & Units 08/03/2019 11/17/2018 09/01/2018  WBC 3.4 - 10.8 x10E3/uL 11.1(H) 11.0(H) 12.5(H)  Hemoglobin 11.1 - 15.9 g/dL 53.2 99.2 42.6  Hematocrit 34.0 - 46.6 % 39.6 39.5 40.2  Platelets 150 - 450 x10E3/uL 402 381 344       CMP Latest Ref Rng & Units 08/03/2019 12/18/2015 09/20/2014  Glucose 65 - 99 mg/dL 91 81 86  BUN 6 - 20 mg/dL 5(L) 11 7  Creatinine 8.34 - 1.00 mg/dL 1.96 2.22 9.79  Sodium 134 - 144 mmol/L 139 140 140  Potassium 3.5 - 5.2 mmol/L 4.1 4.4 4.9  Chloride 96 - 106 mmol/L 103 102 104  CO2 20 - 29 mmol/L 21 28 27   Calcium 8.7 - 10.2 mg/dL 9.5 9.4 9.1  Total Protein 6.0 - 8.5 g/dL 6.6 7.1 6.6  Total Bilirubin 0.0 - 1.2 mg/dL 0.4 0.3 0.3  Alkaline Phos 39 - 117 IU/L 97 96 95  AST 0 - 40 IU/L 31 17 18   ALT 0 - 32 IU/L 53(H) 21 25    Past Medical History:  Diagnosis Date  . Medical history non-contributory    Past Surgical History:  Procedure Laterality Date  . NO PAST SURGERIES       Social History: She is originally from , living in the for 9 years. Married. She has 2 sons ages 62 and 15 years old. She works in home care. No alcohol or drug use. Nonsmoker.   Family History: Mother 49. Father died in his 57's accidental death. Brother and sister are healthy. Paternal grandma age 32 healthy.    No Known Allergies    No outpatient encounter medications on file as of 01/24/2020.   No facility-administered encounter medications on file as of 01/24/2020.     REVIEW OF SYSTEMS:   Gen: Denies fever, sweats or chills. No weight loss.  CV: Denies chest pain, palpitations or edema. Resp: Denies cough, shortness of breath of hemoptysis.  GI: See HPI.  GU : Denies urinary burning, blood in urine, increased urinary frequency or incontinence. MS: Denies joint pain, muscles aches or weakness. Derm: Denies rash, itchiness, skin lesions or unhealing ulcers. Psych: Denies depression or anxiety.  Heme: Denies bruising, bleeding. Neuro:  Denies headaches, dizziness or paresthesias. Endo:  Denies any problems with DM, thyroid or adrenal function.  PHYSICAL EXAM: There were no vitals taken for this visit. BP 100/70   Pulse 96  Ht 4\' 11"  (1.499 m)   Wt 149 lb (67.6 kg)   BMI 30.09 kg/m   Wt Readings from Last 3 Encounters:  12/15/19 149 lb (67.6 kg)  08/03/19 151 lb (68.5 kg)  11/17/18 153 lb (69.4 kg)   General: Well developed 29 year old female in no acute distress. Head: Normocephalic and atraumatic. Eyes:  Sclerae non-icteric, conjunctive pink. Ears: Normal auditory acuity. Mouth: Dentition intact. No ulcers or lesions.  Neck: Supple, no lymphadenopathy or thyromegaly.  Lungs: Clear bilaterally to auscultation without wheezes, crackles or rhonchi. Heart: Regular rate and rhythm. No murmur, rub or gallop appreciated.  Abdomen: Soft, nontender, non distended. No masses. No hepatosplenomegaly. Normoactive bowel sounds x 4 quadrants.  Rectal: Deferred.   Musculoskeletal: Symmetrical with no gross deformities. Skin: Warm and dry. No rash or lesions on visible extremities. Extremities: No edema. Neurological: Alert oriented x 4, no focal deficits.  Psychological:  Alert and cooperative. Normal mood and affect.  ASSESSMENT AND PLAN:  21. 29 year old female with constipation  -TgG, IgA, CBC, CMP and CRP -Linzess 34 one tab po to be taken 30 minutes prior to breakfast  -May take Miralax Q HS as needed -Patient to call me in 2 weeks with an update -I will order an abd/pelvic CT if her abdominal pain worsens or if her laboratory studies are abnormal.  Abdominal exam today was normal.  -Follow up in office with Dr. in 6 weeks  2. Mildly elevated ALT level  -CMP     CC:  Lavon Paganini, NP

## 2020-01-25 LAB — TISSUE TRANSGLUTAMINASE, IGA: (tTG) Ab, IgA: <1 U/mL

## 2020-01-31 NOTE — Progress Notes (Signed)
Reviewed and agree with documentation and assessment and plan. K. Veena Jaydee Ingman , MD   

## 2020-03-29 ENCOUNTER — Ambulatory Visit: Payer: Medicaid Other | Admitting: Gastroenterology

## 2020-03-31 ENCOUNTER — Ambulatory Visit (INDEPENDENT_AMBULATORY_CARE_PROVIDER_SITE_OTHER): Payer: Medicaid Other | Admitting: Gastroenterology

## 2020-03-31 ENCOUNTER — Encounter: Payer: Self-pay | Admitting: Gastroenterology

## 2020-03-31 ENCOUNTER — Telehealth: Payer: Self-pay | Admitting: *Deleted

## 2020-03-31 VITALS — BP 108/60 | HR 95 | Ht 59.0 in | Wt 148.0 lb

## 2020-03-31 DIAGNOSIS — R1011 Right upper quadrant pain: Secondary | ICD-10-CM

## 2020-03-31 DIAGNOSIS — R109 Unspecified abdominal pain: Secondary | ICD-10-CM

## 2020-03-31 DIAGNOSIS — K5904 Chronic idiopathic constipation: Secondary | ICD-10-CM

## 2020-03-31 DIAGNOSIS — Z8719 Personal history of other diseases of the digestive system: Secondary | ICD-10-CM

## 2020-03-31 MED ORDER — LUBIPROSTONE 24 MCG PO CAPS: 24.0000 ug | ORAL_CAPSULE | Freq: Two times a day (BID) | ORAL | 11 refills | Status: DC

## 2020-03-31 NOTE — Patient Instructions (Addendum)
We have sent the following medications to your pharmacy for you to pick up at your convenience: Amitiza 24 mcg twice daily   You have been scheduled for a RUQ abdominal ultrasound at [facility] (1st floor of hospital) on 04/10/20 at 8:30 am. Please arrive at 8:15 am for registration. Make certain not to have anything to eat or drink 6 hours prior to your appointment. Should you need to reschedule your appointment, please contact radiology at [phone]. This test typically takes about 30 minutes to perform.  You have been scheduled for an appointment with Dr Gaynelle Adu at [facility]. Your appointment is on ________________ at ______________. Please arrive at ______________ for registration. Make certain to bring a list of current medications, including any over the counter medications or vitamins. Also bring your co-pay if you have one as well as your insurance cards. [facility] is located at [address]. Should you need to reschedule your appointment, please contact them at [phone].  Please follow up with Dr Lavon Paganini in 3 months.  If you are age 27 or younger, your body mass index should be between 19-25. Your Body mass index is 29.89 kg/m. If this is out of the aformentioned range listed, please consider follow up with your Primary Care Provider.   Due to recent changes in healthcare laws, you may see the results of your imaging and laboratory studies on MyChart before your provider has had a chance to review them.  We understand that in some cases there may be results that are confusing or concerning to you. Not all laboratory results come back in the same time frame and the provider may be waiting for multiple results in order to interpret others.  Please give [place] 48 hours in order for your provider to thoroughly review all the results before contacting the office for clarification of your results.

## 2020-03-31 NOTE — Telephone Encounter (Signed)
21 pages of demographics/insurance cards/records have been sent to [facility] for referral as patient needs to see Dr Gaynelle Adu to discuss cholecystectomy for gallstone measuring 1.5 mm in 2015. She has an ultrasound scheduled for 04/10/20 for update on this gallstone.  We will await appointment information from [facility].

## 2020-03-31 NOTE — Progress Notes (Signed)
Kelsey Davies    [id number]    Jul 29, 1990  Primary Care Physician:Fleming, Shea Stakes, NP  Referring Physician: Claiborne Rigg, NP [address] [place],  [place] [address]   Chief complaint: Constipation, abdominal pain, abdominal cramping  HPI: 29 year old very pleasant female here for follow-up visit for chronic constipation and abdominal pain  Since she started taking Linzess 145 mcg daily she is having more regular bowel movements once every other day, on average has 2-3 bowel movements per week.  Prior to that she was having less than 1/week and was having to strain excessively.  She does feel intermittent abdominal cramping after Linzess and requesting to switch to alternative laxative  She also has upper abdominal pain, worse after a meal and certain foods make it worse. Ultrasound in 2015 showed large 1.5 cm gallstone.  She also has mild elevation in ALT in April 2021 on review of labs  Denies frequent use of NSAIDs or alcohol.  Denies any melena, blood per rectum, vomiting or unintentional weight loss. No family history of GI malignancy or inflammatory bowel disease.  Outpatient Encounter Medications as of 03/31/2020  Medication Sig  . linaclotide (LINZESS) 145 MCG CAPS capsule Take 1 capsule (145 mcg total) by mouth daily before breakfast.   No facility-administered encounter medications on file as of 03/31/2020.    Allergies as of 03/31/2020  . (No Known Allergies)    Past Medical History:  Diagnosis Date  . Medical history non-contributory     Past Surgical History:  Procedure Laterality Date  . NO PAST SURGERIES      Family History  Problem Relation Age of Onset  . Healthy Mother   . Rheum arthritis Maternal Grandmother   . Rheum arthritis Maternal Grandfather   . Asthma Paternal Grandmother   . Rheum arthritis Sister   . Rheum arthritis Brother   . Hearing loss Neg Hx   . Colon cancer Neg Hx   . Stomach cancer Neg Hx   .  Pancreatic cancer Neg Hx     Social History   Socioeconomic History  . Marital status: Married    Spouse name: Not on file  . Number of children: Not on file  . Years of education: Not on file  . Highest education level: Not on file  Occupational History  . Not on file  Tobacco Use  . Smoking status: Never Smoker  . Smokeless tobacco: Never Used  Vaping Use  . Vaping Use: Never used  Substance and Sexual Activity  . Alcohol use: No  . Drug use: No  . Sexual activity: Yes    Birth control/protection: None  Other Topics Concern  . Not on file  Social History Narrative  . Not on file   Social Determinants of Health   Financial Resource Strain: Not on file  Food Insecurity: Not on file  Transportation Needs: Not on file  Physical Activity: Not on file  Stress: Not on file  Social Connections: Not on file  Intimate Partner Violence: Not on file      Review of systems: All other review of systems negative except as mentioned in the HPI.   Physical Exam: Vitals:   03/31/20 0836  BP: 108/60  Pulse: 95   Body mass index is 29.89 kg/m. Gen:      No acute distress HEENT:  sclera anicteric Abd:      Soft, RUQ + tender with Murphy's sign; no palpable  masses, no distension Ext:    No edema Neuro: alert and oriented x 3 Psych: normal mood and affect  Data Reviewed:  Reviewed labs, radiology imaging, old records and pertinent past GI work up   Assessment and Plan/Recommendations:  29 year old very pleasant female with chronic idiopathic constipation, 1.5 cm gallstone and abdominal pain  Chronic idiopathic constipation: Abdominal cramping with Linzess.  We will plan to switch to Amitiza 24 mcg twice daily Increase dietary fiber and water intake If continues to have persistent symptoms, will consider anorectal manometry to exclude dyssynergic defecation  Epigastric/RUQ abdominal pain, postprandial fullness: Could be secondary to symptomatic gallstones She had  1.5 cm gallstone on ultrasound in 2015, no recent imaging Will obtain right upper quadrant ultrasound to further evaluate  Refer to [facility] surgery, Dr. Gaynelle Adu to discuss possible cholecystectomy in the setting of symptomatic gallstones  Return in 3 months or sooner if needed  This visit required 30 minutes of patient care (this includes precharting, chart review, review of results, face-to-face time used for counseling as well as treatment plan and follow-up. The patient was provided an opportunity to ask questions and all were answered. The patient agreed with the plan and demonstrated an understanding of the instructions.  Iona Beard , MD    CC: Claiborne Rigg, NP

## 2020-04-05 ENCOUNTER — Telehealth: Payer: Self-pay | Admitting: *Deleted

## 2020-04-05 NOTE — Telephone Encounter (Signed)
Per [facility] patient is scheduled for an appointment with Dr Gaynelle Adu on 05/03/20 @ 9:45 am, with a 9:15 am arrival. I have left a message asking patient to return my call.  In addition, I need to discuss her upcoming ultrasound time/date/location/prep with her (04/10/20 at 8:30 am- [facility] radiology).

## 2020-04-05 NOTE — Telephone Encounter (Signed)
Patient is returning your call.  

## 2020-04-05 NOTE — Telephone Encounter (Signed)
I have spoken to patient who indicates that she has already been advised of appointment with Dr Andrey Campanile.  She is also advised of appointment time/date/location and prep for ultrasound 04/10/20. She verbalizes understanding and repeats these instructions back to me.

## 2020-04-10 ENCOUNTER — Ambulatory Visit (HOSPITAL_COMMUNITY)
Admission: RE | Admit: 2020-04-10 | Discharge: 2020-04-10 | Disposition: A | Discharge status: Home / Self Care | Payer: Medicaid Other | Source: Ambulatory Visit | Attending: Gastroenterology | Admitting: Gastroenterology

## 2020-04-10 ENCOUNTER — Other Ambulatory Visit: Payer: Self-pay

## 2020-04-10 DIAGNOSIS — R1011 Right upper quadrant pain: Secondary | ICD-10-CM | POA: Insufficient documentation

## 2020-04-10 DIAGNOSIS — R109 Unspecified abdominal pain: Secondary | ICD-10-CM | POA: Insufficient documentation

## 2020-04-10 DIAGNOSIS — K5904 Chronic idiopathic constipation: Secondary | ICD-10-CM | POA: Insufficient documentation

## 2020-04-12 ENCOUNTER — Telehealth: Payer: Self-pay

## 2020-04-12 NOTE — Telephone Encounter (Signed)
Patient was contacted for her imaging results. She tells me the Kelsey Davies is not covered by her insurance and she would like to try something else. Please advise. Thanks.

## 2020-04-18 NOTE — Telephone Encounter (Signed)
Please see patients comment

## 2020-04-19 NOTE — Telephone Encounter (Signed)
Please advise patient to use Miralax 1 capful daily and adjust the dose by titration up or down based on response to have 1-2 soft BM daily. Thanks

## 2020-04-19 NOTE — Telephone Encounter (Signed)
I spoke with a female at the answered number that spoke [place].  Patient initially answered and handed the phone to him  He verbalized understanding of the instructions.

## 2020-07-18 ENCOUNTER — Ambulatory Visit: Payer: Self-pay | Admitting: General Surgery

## 2020-07-18 NOTE — Progress Notes (Signed)
Sent message, via epic in basket, requesting orders in epic from surgeon.  

## 2020-07-21 ENCOUNTER — Encounter (HOSPITAL_COMMUNITY): Payer: Self-pay

## 2020-07-21 NOTE — Patient Instructions (Addendum)
DUE TO COVID-19 ONLY ONE VISITOR IS ALLOWED TO COME WITH YOU AND STAY IN THE    WAITING ROOM ONLY       DURING PRE OP AND PROCEDURE DAY OF SURGERY.   Two  VISITOR  MAY VISIT WITH YOU AFTER SURGERY IN YOUR PRIVATE ROOM DURING VISITING HOURS ONLY!  YOU NEED TO HAVE A COVID 19 TEST ON__4-11-22_____ @_______ , THIS TEST MUST BE DONE BEFORE SURGERY,  COVID TESTING SITE [address] [place] , IT IS ON THE RIGHT GOING OUT [address] APPROXIMATELY  2 MINUTES PAST [employer] ON THE RIGHT. ONCE YOUR COVID TEST IS COMPLETED,  PLEASE BEGIN THE QUARANTINE INSTRUCTIONS AS OUTLINED IN YOUR HANDOUT.                Kelsey Davies  07/21/2020   Your procedure is scheduled on: 08-03-20   Report to [facility] Main  Entrance   Report to admitting at     0515 AM     Call this number if you have problems the morning of surgery [phone]    Remember: Do not eat food  :After Midnight.you may have clear liquids until      0430 am then nothing by mouth     CLEAR LIQUID DIET   Foods Allowed                                                                     Foods Excluded water Black Coffee and tea, regular and decaf                             liquids that you cannot  Plain Jell-O any favor except red or purple                                           see through such as: Fruit ices (not with fruit pulp)                                     milk, soups, orange juice  Iced Popsicles                                    All solid food Carbonated beverages, regular and diet                                    Cranberry, grape and apple juices Sports drinks like Gatorade Lightly seasoned clear broth or consume(fat free) Sugar, honey syrup   _____________________________________________________________________    BRUSH YOUR TEETH MORNING OF SURGERY AND RINSE YOUR MOUTH OUT, NO CHEWING GUM CANDY OR MINTS.     Take these medicines the morning of surgery with A  SIP OF WATER: NONE  You may not have any metal on your body including hair pins and              piercings  Do not wear jewelry, make-up, lotions, powders or perfumes, deodorant             Do not wear nail polish on your fingernails.  Do not shave  48 hours prior to surgery.             Do not bring valuables to the hospital. [facility] IS NOT             RESPONSIBLE   FOR VALUABLES.  Contacts, dentures or bridgework may not be worn into surgery.      Patients discharged the day of surgery will not be allowed to drive home. IF YOU ARE HAVING SURGERY AND GOING HOME THE SAME DAY, YOU MUST HAVE AN ADULT TO DRIVE YOU HOME AND BE WITH YOU FOR 24 HOURS. YOU MAY GO HOME BY TAXI OR UBER OR ORTHERWISE, BUT AN ADULT MUST ACCOMPANY YOU HOME AND STAY WITH YOU FOR 24 HOURS.  Name and phone number of your driver:  Special Instructions: N/A              Please read over the following fact sheets you were given: _____________________________________________________________________             [facility] - Preparing for Surgery Before surgery, you can play an important role.  Because skin is not sterile, your skin needs to be as free of germs as possible.  You can reduce the number of germs on your skin by washing with CHG (chlorahexidine gluconate) soap before surgery.  CHG is an antiseptic cleaner which kills germs and bonds with the skin to continue killing germs even after washing. Please DO NOT use if you have an allergy to CHG or antibacterial soaps.  If your skin becomes reddened/irritated stop using the CHG and inform your nurse when you arrive at Short Stay. Do not shave (including legs and underarms) for at least 48 hours prior to the first CHG shower.  You may shave your face/neck. Please follow these instructions carefully:  1.  Shower with CHG Soap the night before surgery and the  morning of Surgery.  2.  If you choose to wash your hair, wash your hair  first as usual with your  normal  shampoo.  3.  After you shampoo, rinse your hair and body thoroughly to remove the  shampoo.                           4.  Use CHG as you would any other liquid soap.  You can apply chg directly  to the skin and wash                       Gently with a scrungie or clean washcloth.  5.  Apply the CHG Soap to your body ONLY FROM THE NECK DOWN.   Do not use on face/ open                           Wound or open sores. Avoid contact with eyes, ears mouth and genitals (private parts).                       Wash face,  Genitals (private parts) with your normal  soap.             6.  Wash thoroughly, paying special attention to the area where your surgery  will be performed.  7.  Thoroughly rinse your body with warm water from the neck down.  8.  DO NOT shower/wash with your normal soap after using and rinsing off  the CHG Soap.                9.  Pat yourself dry with a clean towel.            10.  Wear clean pajamas.            11.  Place clean sheets on your bed the night of your first shower and do not  sleep with pets. Day of Surgery : Do not apply any lotions/deodorants the morning of surgery.  Please wear clean clothes to the hospital/surgery center.  FAILURE TO FOLLOW THESE INSTRUCTIONS MAY RESULT IN THE CANCELLATION OF YOUR SURGERY PATIENT SIGNATURE_________________________________  NURSE SIGNATURE__________________________________  ________________________________________________________________________

## 2020-07-21 NOTE — Progress Notes (Addendum)
PCP - Bertram Denver NP Cardiologist - no  PPM/ICD -  Device Orders -  Rep Notified -   Chest x-ray -  EKG -  Stress Test -  ECHO -  Cardiac Cath -   Sleep Study -  CPAP -   Fasting Blood Sugar -  Checks Blood Sugar _____ times a day  Blood Thinner Instructions: Aspirin Instructions:  ERAS Protcol - PRE-SURGERY Ensure or G2-   COVID TEST- 07-31-20  Activity---able to walk a flight of stairs without SOB Anesthesia review:   Patient denies shortness of breath, fever, cough and chest pain at PAT appointment   All instructions explained to the patient, with a verbal understanding of the material. Patient agrees to go over the instructions while at home for a better understanding. Patient also instructed to self quarantine after being tested for COVID-19. The opportunity to ask questions was provided.

## 2020-07-24 ENCOUNTER — Encounter (HOSPITAL_COMMUNITY)
Admission: RE | Admit: 2020-07-24 | Discharge: 2020-07-24 | Disposition: A | Discharge status: Home / Self Care | Payer: Medicaid Other | Source: Ambulatory Visit | Attending: General Surgery | Admitting: General Surgery

## 2020-07-24 ENCOUNTER — Encounter (HOSPITAL_COMMUNITY): Payer: Self-pay

## 2020-07-24 ENCOUNTER — Other Ambulatory Visit: Payer: Self-pay

## 2020-07-24 DIAGNOSIS — Z01812 Encounter for preprocedural laboratory examination: Secondary | ICD-10-CM | POA: Diagnosis not present

## 2020-07-24 LAB — COMPREHENSIVE METABOLIC PANEL
ALT: 63 U/L — ABNORMAL HIGH (ref 0–44)
AST: 33 U/L (ref 15–41)
Albumin: 3.9 g/dL (ref 3.5–5.0)
Alkaline Phosphatase: 76 U/L (ref 38–126)
Anion gap: 7 (ref 5–15)
BUN: 6 mg/dL (ref 6–20)
CO2: 22 mmol/L (ref 22–32)
Calcium: 8.9 mg/dL (ref 8.9–10.3)
Chloride: 104 mmol/L (ref 98–111)
Creatinine, Ser: 0.56 mg/dL (ref 0.44–1.00)
GFR, Estimated: >60 mL/min (ref >60)
Glucose, Bld: 89 mg/dL (ref 70–99)
Potassium: 4.4 mmol/L (ref 3.5–5.1)
Sodium: 133 mmol/L — ABNORMAL LOW (ref 135–145)
Total Bilirubin: 0.7 mg/dL (ref 0.3–1.2)
Total Protein: 7.1 g/dL (ref 6.5–8.1)

## 2020-07-24 LAB — CBC WITH DIFFERENTIAL/PLATELET
Abs Immature Granulocytes: 0.07 10*3/uL (ref 0.00–0.07)
Basophils Absolute: 0.1 10*3/uL (ref 0.0–0.1)
Basophils Relative: 1 %
Eosinophils Absolute: 0.5 10*3/uL (ref 0.0–0.5)
Eosinophils Relative: 4 %
HCT: 40.4 % (ref 36.0–46.0)
Hemoglobin: 12.8 g/dL (ref 12.0–15.0)
Immature Granulocytes: 1 %
Lymphocytes Relative: 27 %
Lymphs Abs: 2.9 10*3/uL (ref 0.7–4.0)
MCH: 26.9 pg (ref 26.0–34.0)
MCHC: 31.7 g/dL (ref 30.0–36.0)
MCV: 85.1 fL (ref 80.0–100.0)
Monocytes Absolute: 0.5 10*3/uL (ref 0.1–1.0)
Monocytes Relative: 5 %
Neutro Abs: 6.7 10*3/uL (ref 1.7–7.7)
Neutrophils Relative %: 62 %
Platelets: 470 10*3/uL — ABNORMAL HIGH (ref 150–400)
RBC: 4.75 MIL/uL (ref 3.87–5.11)
RDW: 14.1 % (ref 11.5–15.5)
WBC: 10.6 10*3/uL — ABNORMAL HIGH (ref 4.0–10.5)
nRBC: 0 % (ref 0.0–0.2)

## 2020-07-31 ENCOUNTER — Other Ambulatory Visit (HOSPITAL_COMMUNITY)
Admission: RE | Admit: 2020-07-31 | Discharge: 2020-07-31 | Disposition: A | Discharge status: Home / Self Care | Payer: Medicaid Other | Source: Ambulatory Visit | Attending: General Surgery | Admitting: General Surgery

## 2020-07-31 DIAGNOSIS — Z01812 Encounter for preprocedural laboratory examination: Secondary | ICD-10-CM | POA: Insufficient documentation

## 2020-07-31 DIAGNOSIS — Z20822 Contact with and (suspected) exposure to covid-19: Secondary | ICD-10-CM | POA: Insufficient documentation

## 2020-07-31 LAB — SARS CORONAVIRUS 2 (TAT 6-24 HRS): SARS Coronavirus 2: NEGATIVE

## 2020-08-02 ENCOUNTER — Encounter (HOSPITAL_COMMUNITY): Payer: Self-pay | Admitting: Certified Registered Nurse Anesthetist

## 2020-08-02 MED ORDER — INDOCYANINE GREEN 25 MG IV SOLR
7.5000 mg | Freq: Once | INTRAVENOUS | Status: DC
  Filled 2020-08-02 (×2): qty 10

## 2020-08-02 NOTE — Anesthesia Preprocedure Evaluation (Deleted)
Anesthesia Evaluation    Reviewed: Allergy & Precautions, Patient's Chart, lab work & pertinent test results  Airway        Dental   Pulmonary neg pulmonary ROS,           Cardiovascular negative cardio ROS       Neuro/Psych negative neurological ROS  negative psych ROS   GI/Hepatic negative GI ROS, Neg liver ROS,   Endo/Other  negative endocrine ROS  Renal/GU negative Renal ROS  negative genitourinary   Musculoskeletal negative musculoskeletal ROS (+)   Abdominal   Peds  Hematology negative hematology ROS (+)   Anesthesia Other Findings   Reproductive/Obstetrics                             Anesthesia Physical Anesthesia Plan  ASA: I  Anesthesia Plan: General   Post-op Pain Management:    Induction: Intravenous  PONV Risk Score and Plan: 4 or greater and Ondansetron, Dexamethasone and Midazolam  Airway Management Planned: Oral ETT  Additional Equipment: None  Intra-op Plan:   Post-operative Plan: Extubation in OR  Informed Consent:   Plan Discussed with:   Anesthesia Plan Comments:         Anesthesia Quick Evaluation

## 2020-08-03 ENCOUNTER — Encounter (HOSPITAL_COMMUNITY)
Admission: RE | Disposition: A | Discharge status: Home / Self Care | Payer: Self-pay | Source: Home / Self Care | Attending: General Surgery

## 2020-08-03 ENCOUNTER — Ambulatory Visit (HOSPITAL_COMMUNITY)
Admission: RE | Admit: 2020-08-03 | Discharge: 2020-08-03 | Disposition: A | Discharge status: Home / Self Care | Payer: Medicaid Other | Attending: General Surgery | Admitting: General Surgery

## 2020-08-03 ENCOUNTER — Encounter (HOSPITAL_COMMUNITY): Payer: Self-pay | Admitting: General Surgery

## 2020-08-03 DIAGNOSIS — Z5309 Procedure and treatment not carried out because of other contraindication: Secondary | ICD-10-CM | POA: Diagnosis not present

## 2020-08-03 DIAGNOSIS — O99619 Diseases of the digestive system complicating pregnancy, unspecified trimester: Secondary | ICD-10-CM | POA: Diagnosis not present

## 2020-08-03 DIAGNOSIS — Z3A00 Weeks of gestation of pregnancy not specified: Secondary | ICD-10-CM | POA: Diagnosis not present

## 2020-08-03 DIAGNOSIS — K8020 Calculus of gallbladder without cholecystitis without obstruction: Secondary | ICD-10-CM | POA: Insufficient documentation

## 2020-08-03 LAB — PREGNANCY, URINE: Preg Test, Ur: POSITIVE — AB

## 2020-08-03 SURGERY — LAPAROSCOPIC CHOLECYSTECTOMY WITH INTRAOPERATIVE CHOLANGIOGRAM
Anesthesia: General

## 2020-08-03 MED ORDER — CHLORHEXIDINE GLUCONATE CLOTH 2 % EX PADS: 6.0000 | MEDICATED_PAD | Freq: Once | CUTANEOUS | Status: DC

## 2020-08-03 MED ORDER — ACETAMINOPHEN 500 MG PO TABS
1000.0000 mg | ORAL_TABLET | ORAL | Status: AC
  Administered 2020-08-03: 1000 mg via ORAL
  Filled 2020-08-03: qty 2

## 2020-08-03 MED ORDER — DEXAMETHASONE SODIUM PHOSPHATE 10 MG/ML IJ SOLN
INTRAMUSCULAR | Status: AC
  Filled 2020-08-03: qty 1

## 2020-08-03 MED ORDER — CHLORHEXIDINE GLUCONATE 0.12 % MT SOLN: 15.0000 mL | Freq: Once | OROMUCOSAL | Status: AC

## 2020-08-03 MED ORDER — ONDANSETRON HCL 4 MG/2ML IJ SOLN
INTRAMUSCULAR | Status: AC
  Filled 2020-08-03: qty 2

## 2020-08-03 MED ORDER — PROPOFOL 10 MG/ML IV BOLUS
INTRAVENOUS | Status: AC
  Filled 2020-08-03: qty 20

## 2020-08-03 MED ORDER — FENTANYL CITRATE (PF) 100 MCG/2ML IJ SOLN
INTRAMUSCULAR | Status: AC
  Filled 2020-08-03: qty 2

## 2020-08-03 MED ORDER — ORAL CARE MOUTH RINSE
15.0000 mL | Freq: Once | OROMUCOSAL | Status: AC
  Administered 2020-08-03: 15 mL via OROMUCOSAL

## 2020-08-03 MED ORDER — LACTATED RINGERS IV SOLN: INTRAVENOUS | Status: DC

## 2020-08-03 MED ORDER — SODIUM CHLORIDE 0.9 % IV SOLN
2.0000 g | INTRAVENOUS | Status: DC
  Filled 2020-08-03: qty 2

## 2020-08-03 MED ORDER — ENSURE PRE-SURGERY PO LIQD
296.0000 mL | Freq: Once | ORAL | Status: DC
  Filled 2020-08-03: qty 296

## 2020-08-03 NOTE — Progress Notes (Signed)
Pt procedure cancelled because of positive pregnancy test. Dr. Andrey Campanile and Dr. Arby Barrette made aware. Pt verbalized understanding. Pt IV taken out and VS stable before leaving.

## 2020-08-03 NOTE — Progress Notes (Signed)
Alerted by preop holding that the patient's urine pregnancy test came back positive  Patient was informed of her positive pregnancy test  I encouraged her to start taking prenatal multivitamin  I also informed her that she would need to see her OB/GYN to have a confirmatory test  We discussed that should she need surgery for her gallbladder during her pregnancy that the best time to do would be during her second trimester.  However if she is relatively asymptomatic then we could wait till after she delivers  She states that she really has not had any flare since I saw her in February.  I encouraged her to contact our office should she have increasing flares  Otherwise we will put on a recall list for 9 months  I asked if the patient had any questions or concerns.  She verbalized understanding  Patient's procedure was canceled for today  Mary Sella. Andrey Campanile, MD, FACS General, Bariatric, & Minimally Invasive Surgery [facility], [place]

## 2020-08-10 ENCOUNTER — Other Ambulatory Visit: Payer: Self-pay

## 2020-08-10 ENCOUNTER — Emergency Department (HOSPITAL_COMMUNITY): Payer: Medicaid Other

## 2020-08-10 ENCOUNTER — Emergency Department (HOSPITAL_COMMUNITY)
Admission: EM | Admit: 2020-08-10 | Discharge: 2020-08-10 | Disposition: A | Discharge status: Home / Self Care | Payer: Medicaid Other | Attending: Emergency Medicine | Admitting: Emergency Medicine

## 2020-08-10 DIAGNOSIS — R0789 Other chest pain: Secondary | ICD-10-CM | POA: Diagnosis not present

## 2020-08-10 DIAGNOSIS — R000 Tachycardia, unspecified: Secondary | ICD-10-CM | POA: Insufficient documentation

## 2020-08-10 DIAGNOSIS — K8050 Calculus of bile duct without cholangitis or cholecystitis without obstruction: Secondary | ICD-10-CM | POA: Insufficient documentation

## 2020-08-10 DIAGNOSIS — R059 Cough, unspecified: Secondary | ICD-10-CM | POA: Insufficient documentation

## 2020-08-10 DIAGNOSIS — R109 Unspecified abdominal pain: Secondary | ICD-10-CM

## 2020-08-10 DIAGNOSIS — R1013 Epigastric pain: Secondary | ICD-10-CM | POA: Diagnosis present

## 2020-08-10 LAB — CBC WITH DIFFERENTIAL/PLATELET
Abs Immature Granulocytes: 0.06 10*3/uL (ref 0.00–0.07)
Basophils Absolute: 0.1 10*3/uL (ref 0.0–0.1)
Basophils Relative: 1 %
Eosinophils Absolute: 0.3 10*3/uL (ref 0.0–0.5)
Eosinophils Relative: 2 %
HCT: 38.3 % (ref 36.0–46.0)
Hemoglobin: 12 g/dL (ref 12.0–15.0)
Immature Granulocytes: 1 %
Lymphocytes Relative: 17 %
Lymphs Abs: 1.9 10*3/uL (ref 0.7–4.0)
MCH: 26.8 pg (ref 26.0–34.0)
MCHC: 31.3 g/dL (ref 30.0–36.0)
MCV: 85.5 fL (ref 80.0–100.0)
Monocytes Absolute: 0.5 10*3/uL (ref 0.1–1.0)
Monocytes Relative: 5 %
Neutro Abs: 8.2 10*3/uL — ABNORMAL HIGH (ref 1.7–7.7)
Neutrophils Relative %: 74 %
Platelets: 366 10*3/uL (ref 150–400)
RBC: 4.48 MIL/uL (ref 3.87–5.11)
RDW: 14.1 % (ref 11.5–15.5)
WBC: 11 10*3/uL — ABNORMAL HIGH (ref 4.0–10.5)
nRBC: 0 % (ref 0.0–0.2)

## 2020-08-10 LAB — URINALYSIS, ROUTINE W REFLEX MICROSCOPIC
Bacteria, UA: NONE SEEN
Bilirubin Urine: NEGATIVE
Glucose, UA: NEGATIVE mg/dL
Ketones, ur: NEGATIVE mg/dL
Nitrite: NEGATIVE
Protein, ur: NEGATIVE mg/dL
RBC / HPF: >50 RBC/hpf — ABNORMAL HIGH (ref 0–5)
Specific Gravity, Urine: 1.014 (ref 1.005–1.030)
pH: 7 (ref 5.0–8.0)

## 2020-08-10 LAB — COMPREHENSIVE METABOLIC PANEL
ALT: 38 U/L (ref 0–44)
AST: 29 U/L (ref 15–41)
Albumin: 3.2 g/dL — ABNORMAL LOW (ref 3.5–5.0)
Alkaline Phosphatase: 67 U/L (ref 38–126)
Anion gap: 8 (ref 5–15)
BUN: 5 mg/dL — ABNORMAL LOW (ref 6–20)
CO2: 24 mmol/L (ref 22–32)
Calcium: 8.8 mg/dL — ABNORMAL LOW (ref 8.9–10.3)
Chloride: 104 mmol/L (ref 98–111)
Creatinine, Ser: 0.59 mg/dL (ref 0.44–1.00)
GFR, Estimated: >60 mL/min (ref >60)
Glucose, Bld: 119 mg/dL — ABNORMAL HIGH (ref 70–99)
Potassium: 3.6 mmol/L (ref 3.5–5.1)
Sodium: 136 mmol/L (ref 135–145)
Total Bilirubin: 0.3 mg/dL (ref 0.3–1.2)
Total Protein: 6.2 g/dL — ABNORMAL LOW (ref 6.5–8.1)

## 2020-08-10 LAB — I-STAT BETA HCG BLOOD, ED (MC, WL, AP ONLY): I-stat hCG, quantitative: >2000 m[IU]/mL — ABNORMAL HIGH (ref <5)

## 2020-08-10 LAB — HCG, QUANTITATIVE, PREGNANCY: hCG, Beta Chain, Quant, S: 7253 m[IU]/mL — ABNORMAL HIGH (ref <5)

## 2020-08-10 LAB — LIPASE, BLOOD: Lipase: 26 U/L (ref 11–51)

## 2020-08-10 MED ORDER — SUCRALFATE 1 G PO TABS: 1.0000 g | ORAL_TABLET | Freq: Three times a day (TID) | ORAL | 0 refills | Status: DC

## 2020-08-10 MED ORDER — DICYCLOMINE HCL 10 MG PO CAPS
10.0000 mg | ORAL_CAPSULE | Freq: Once | ORAL | Status: AC
  Administered 2020-08-10: 10 mg via ORAL
  Filled 2020-08-10: qty 1

## 2020-08-10 MED ORDER — SODIUM CHLORIDE 0.9 % IV BOLUS
1000.0000 mL | Freq: Once | INTRAVENOUS | Status: AC
  Administered 2020-08-10: 1000 mL via INTRAVENOUS

## 2020-08-10 MED ORDER — FAMOTIDINE 20 MG PO TABS: 20.0000 mg | ORAL_TABLET | Freq: Two times a day (BID) | ORAL | 0 refills | Status: DC

## 2020-08-10 MED ORDER — ONDANSETRON 4 MG PO TBDP: 4.0000 mg | ORAL_TABLET | Freq: Three times a day (TID) | ORAL | 0 refills | Status: DC | PRN

## 2020-08-10 MED ORDER — ALUM & MAG HYDROXIDE-SIMETH 200-200-20 MG/5ML PO SUSP
15.0000 mL | Freq: Once | ORAL | Status: AC
  Administered 2020-08-10: 15 mL via ORAL
  Filled 2020-08-10: qty 30

## 2020-08-10 NOTE — ED Triage Notes (Addendum)
Emergency Medicine Provider Triage Evaluation Note  Kelsey Davies , a 30 y.o. female  was evaluated in triage.  Pt complains of RUQ abdominal pain with vomiting after eating rice. No fevers, no changes in bowel or bladder habits. Was scheduled to have cholecystectomy earlier this month but had +upreg and was canceled. Then had an abortion (on Monday), denies current pregnancy.   Review of Systems  Positive: Abdominal pain, vomiting Negative: Fever, changes in bowel or bladder habits  Physical Exam  There were no vitals taken for this visit. Gen:   Awake, no distress   HEENT:  Atraumatic  Resp:  Normal effort  Cardiac:  tachycardic Abd:   Nondistended, RUQ TTP MSK:   Moves extremities without difficulty  Neuro:  Speech clear   Medical Decision Making  Medically screening exam initiated at 2:03 PM.  Appropriate orders placed.  Shemicka Cohrs was informed that the remainder of the evaluation will be completed by another provider, this initial triage assessment does not replace that evaluation, and the importance of remaining in the ED until their evaluation is complete.  Clinical Impression     Jeannie Fend, PA-C 08/10/20 1419    Jeannie Fend, PA-C 08/10/20 1729

## 2020-08-10 NOTE — ED Provider Notes (Signed)
[facility] EMERGENCY DEPARTMENT Provider Note   CSN: [id number] Arrival date & time: 08/10/20  1345     History Chief Complaint  Patient presents with  . Abdominal Pain    Kelsey Davies is a 30 y.o. female.  The history is provided by the patient and medical records.  Abdominal Pain  Kelsey Davies is a 30 y.o. female who presents to the Emergency Department complaining of abdominal pain and cough.  She presents to the ED complaining of one month of epigastric abdominal pain described as cramping, which makes it difficult to breathe.  Has vomiting 2-3 times daily for the last week.  No fever.  Has chest discomfort.  Has cough for two days.  No leg swelling/pain.  No diarrhea.  No dysuria.    Had surgery scheduled for April 14 but it was cancelled due to pregnancy.   G3P2.  Had [facility] April 18, [facility]  Has scant bleeding, no pain.      Past Medical History:  Diagnosis Date  . Medical history non-contributory     Patient Active Problem List   Diagnosis Date Noted  . History of prior pregnancy with IUGR newborn 11/17/2018  . Low back pain 08/12/2013  . Neuropathic pain 08/12/2013  . Idiopathic scoliosis 09/25/2012    Past Surgical History:  Procedure Laterality Date  . NO PAST SURGERIES       OB History    Gravida  2   Para  2   Term  2   Preterm      AB      Living  2     SAB      IAB      Ectopic      Multiple  0   Live Births  2           Family History  Problem Relation Age of Onset  . Healthy Mother   . Rheum arthritis Maternal Grandmother   . Rheum arthritis Maternal Grandfather   . Asthma Paternal Grandmother   . Rheum arthritis Sister   . Rheum arthritis Brother   . Hearing loss Neg Hx   . Colon cancer Neg Hx   . Stomach cancer Neg Hx   . Pancreatic cancer Neg Hx     Social History   Tobacco Use  . Smoking status: Never Smoker  . Smokeless tobacco: Never Used  Vaping  Use  . Vaping Use: Never used  Substance Use Topics  . Alcohol use: No  . Drug use: No    Home Medications Prior to Admission medications   Medication Sig Start Date End Date Taking? Authorizing Provider  famotidine (PEPCID) 20 MG tablet Take 1 tablet (20 mg total) by mouth 2 (two) times daily. 08/10/20  Yes Tilden Fossa, MD  ondansetron (ZOFRAN ODT) 4 MG disintegrating tablet Take 1 tablet (4 mg total) by mouth every 8 (eight) hours as needed for nausea or vomiting. 08/10/20  Yes Tilden Fossa, MD  sucralfate (CARAFATE) 1 g tablet Take 1 tablet (1 g total) by mouth 4 (four) times daily -  with meals and at bedtime. 08/10/20  Yes Tilden Fossa, MD  lubiprostone (AMITIZA) 24 MCG capsule Take 1 capsule (24 mcg total) by mouth 2 (two) times daily with a meal. Patient not taking: Reported on 07/10/2020 03/31/20   Napoleon Form, MD    Allergies    Patient has no known allergies.  Review of Systems   Review of  Systems  Gastrointestinal: Positive for abdominal pain.  All other systems reviewed and are negative.   Physical Exam Updated Vital Signs BP 100/65   Pulse 94   Temp 98.6 F (37 C) (Oral)   Resp 19   SpO2 98%   Physical Exam Vitals and nursing note reviewed.  Constitutional:      Appearance: She is well-developed.  HENT:     Head: Normocephalic and atraumatic.  Cardiovascular:     Rate and Rhythm: Regular rhythm. Tachycardia present.     Heart sounds: No murmur heard.   Pulmonary:     Effort: Pulmonary effort is normal. No respiratory distress.     Breath sounds: Normal breath sounds.  Abdominal:     Palpations: Abdomen is soft.     Tenderness: There is no guarding or rebound.     Comments: Mild epigastric and RUQ tenderness  Musculoskeletal:        General: No swelling or tenderness.  Skin:    General: Skin is warm and dry.  Neurological:     Mental Status: She is alert and oriented to person, place, and time.  Psychiatric:        Behavior:  Behavior normal.     ED Results / Procedures / Treatments   Labs (all labs ordered are listed, but only abnormal results are displayed) Labs Reviewed  CBC WITH DIFFERENTIAL/PLATELET - Abnormal; Notable for the following components:      Result Value   WBC 11.0 (*)    Neutro Abs 8.2 (*)    All other components within normal limits  COMPREHENSIVE METABOLIC PANEL - Abnormal; Notable for the following components:   Glucose, Bld 119 (*)    BUN 5 (*)    Calcium 8.8 (*)    Total Protein 6.2 (*)    Albumin 3.2 (*)    All other components within normal limits  URINALYSIS, ROUTINE W REFLEX MICROSCOPIC - Abnormal; Notable for the following components:   APPearance HAZY (*)    Hgb urine dipstick MODERATE (*)    Leukocytes,Ua TRACE (*)    RBC / HPF >50 (*)    All other components within normal limits  HCG, QUANTITATIVE, PREGNANCY - Abnormal; Notable for the following components:   hCG, Beta Chain, Quant, S 7,253 (*)    All other components within normal limits  I-STAT BETA HCG BLOOD, ED ([facility], [facility], [facility] ONLY) - Abnormal; Notable for the following components:   I-stat hCG, quantitative >2,000.0 (*)    All other components within normal limits  LIPASE, BLOOD    EKG None  Radiology US Abdomen Limited  Result Date: 08/10/2020 CLINICAL DATA:  Right upper quadrant pain EXAM: ULTRASOUND ABDOMEN LIMITED RIGHT UPPER QUADRANT COMPARISON:  04/10/2020 FINDINGS: Gallbladder: Shadowing stone in the gallbladder measuring up to 2.3 cm. Normal wall thickness. Negative sonographic Murphy Common bile duct: Diameter: 3.9 mm Liver: Liver is echogenic. No focal hepatic abnormality. Portal vein is patent on color Doppler imaging with normal direction of blood flow towards the liver. Other: None. IMPRESSION: 1. Cholelithiasis without sonographic evidence for acute cholecystitis. 2. Echogenic liver consistent with hepatic steatosis Electronically Signed   By: Jasmine Pang M.D.   On: 08/10/2020 17:14   US OB LESS  THAN 14 WEEKS WITH OB TRANSVAGINAL  Result Date: 08/10/2020 CLINICAL DATA:  Abdominal pain EXAM: OBSTETRIC <14 WK [place] AND TRANSVAGINAL OB US TECHNIQUE: Both transabdominal and transvaginal ultrasound examinations were performed for complete evaluation of the gestation as well as the maternal uterus,  adnexal regions, and pelvic cul-de-sac. Transvaginal technique was performed to assess early pregnancy. COMPARISON:  None. FINDINGS: Intrauterine gestational sac: None Yolk sac:  Not Visualized. Embryo:  Not Visualized. Cardiac Activity: Not Visualized. Heart Rate:   bpm MSD:   mm    w     d CRL:    mm    w    d                  [place] EDC: Subchorionic hemorrhage:  None visualized. Maternal uterus/adnexae: No adnexal mass. Trace free fluid. Endometrium 10 mm in thickness. IMPRESSION: No intrauterine pregnancy visualized. Differential considerations would include early intrauterine pregnancy too early to visualize, spontaneous abortion, or occult ectopic pregnancy. Recommend close clinical followup and serial quantitative beta HCGs and ultrasounds. Electronically Signed   By: Charlett Nose M.D.   On: 08/10/2020 17:15    Procedures Procedures   Medications Ordered in ED Medications  alum & mag hydroxide-simeth (MAALOX/MYLANTA) 200-200-20 MG/5ML suspension 15 mL (15 mLs Oral Given 08/10/20 2059)  dicyclomine (BENTYL) capsule 10 mg (10 mg Oral Given 08/10/20 2058)  sodium chloride 0.9 % bolus 1,000 mL (1,000 mLs Intravenous New Bag/Given 08/10/20 2058)    ED Course  I have reviewed the triage vital signs and the nursing notes.  Pertinent labs & imaging results that were available during my care of the patient were reviewed by me and considered in my medical decision making (see chart for details).    MDM Rules/Calculators/A&P                         patient here for evaluation of abdominal pain, vomiting. She did have a recent D and C. She has mild epigastric tenderness on examination without peritoneal  findings. Her pregnancy hormone is elevated but this is to be expected in the setting of recent [facility], no significant bleeding or concerns for retained products or septic abortion. Writer per quadrant ultrasound with cholelithiasis, no findings of cholecystitis. CBC with stable leukocytosis. Negative Murphy's. After treatment in the department with fluids, antiemetics she is feeling improved and able to tolerate oral liquids. Discuss follow-up with OB/GYN to follow quant to zero as well as follow-up with her surgeon. Return precautions discussed. Final Clinical Impression(s) / ED Diagnoses Final diagnoses:  Biliary colic    Rx / DC Orders ED Discharge Orders         Ordered    ondansetron (ZOFRAN ODT) 4 MG disintegrating tablet  Every 8 hours PRN        08/10/20 2146    sucralfate (CARAFATE) 1 g tablet  3 times daily with meals & bedtime        08/10/20 2146    famotidine (PEPCID) 20 MG tablet  2 times daily        08/10/20 2146           Tilden Fossa, MD 08/10/20 2149

## 2020-08-10 NOTE — Discharge Instructions (Addendum)
Please follow up with your OB/GYN to have your pregnancy hormone rechecked. Get rechecked immediately if you develop fevers, uncontrolled pain or heavy irrational bleeding.

## 2020-08-10 NOTE — ED Triage Notes (Signed)
Pt presents with RUQ pain for the last couple of days after eating rice. Pt denies vomiting. Pt still has gallbladder.

## 2020-08-25 ENCOUNTER — Other Ambulatory Visit: Payer: Self-pay | Admitting: General Surgery

## 2020-09-24 ENCOUNTER — Other Ambulatory Visit: Payer: Self-pay

## 2020-09-24 ENCOUNTER — Ambulatory Visit (HOSPITAL_COMMUNITY)
Admission: EM | Admit: 2020-09-24 | Discharge: 2020-09-24 | Disposition: A | Discharge status: Home / Self Care | Payer: Medicaid Other | Attending: Student | Admitting: Student

## 2020-09-24 ENCOUNTER — Encounter (HOSPITAL_COMMUNITY): Payer: Self-pay

## 2020-09-24 DIAGNOSIS — L301 Dyshidrosis [pompholyx]: Secondary | ICD-10-CM

## 2020-09-24 MED ORDER — TRIAMCINOLONE ACETONIDE 0.5 % EX OINT: 1.0000 "application " | TOPICAL_OINTMENT | Freq: Two times a day (BID) | CUTANEOUS | 0 refills | Status: AC

## 2020-09-24 NOTE — ED Triage Notes (Signed)
Pt reports itchy rash between fingers right hand x 1 month. Vaseline gives no relief.

## 2020-09-24 NOTE — Discharge Instructions (Addendum)
Triamcinolone ointment twice daily for about 7 days.

## 2020-09-24 NOTE — ED Provider Notes (Signed)
[facility]    CSN: [id number] Arrival date & time: 09/24/20  1433      History   Chief Complaint Chief Complaint  Patient presents with  . Rash    HPI Kelsey Davies is a 30 y.o. female presenting with itchy rash between fingers R hand x1 month. Medical history noncontributory. Vaseline provides no relief. Denies fevers/chills, lesions elsewhere.  Denies time outside, changing detergent, new soaps.  Denies history of eczema.  HPI  Past Medical History:  Diagnosis Date  . Medical history non-contributory     Patient Active Problem List   Diagnosis Date Noted  . History of prior pregnancy with IUGR newborn 11/17/2018  . Low back pain 08/12/2013  . Neuropathic pain 08/12/2013  . Idiopathic scoliosis 09/25/2012    Past Surgical History:  Procedure Laterality Date  . NO PAST SURGERIES      OB History    Gravida  2   Para  2   Term  2   Preterm      AB      Living  2     SAB      IAB      Ectopic      Multiple  0   Live Births  2            Home Medications    Prior to Admission medications   Medication Sig Start Date End Date Taking? Authorizing Provider  triamcinolone ointment (KENALOG) 0.5 % Apply 1 application topically 2 (two) times daily for 7 days. Apply to affected area on hand twice daily for 7 days. 09/24/20 10/01/20 Yes Rhys Martini, PA-C  famotidine (PEPCID) 20 MG tablet Take 1 tablet (20 mg total) by mouth 2 (two) times daily. 08/10/20   Tilden Fossa, MD  lubiprostone (AMITIZA) 24 MCG capsule Take 1 capsule (24 mcg total) by mouth 2 (two) times daily with a meal. Patient not taking: No sig reported 03/31/20   Napoleon Form, MD  ondansetron (ZOFRAN ODT) 4 MG disintegrating tablet Take 1 tablet (4 mg total) by mouth every 8 (eight) hours as needed for nausea or vomiting. 08/10/20   Tilden Fossa, MD  sucralfate (CARAFATE) 1 g tablet Take 1 tablet (1 g total) by mouth 4 (four) times daily -  with meals and at  bedtime. 08/10/20   Tilden Fossa, MD    Family History Family History  Problem Relation Age of Onset  . Healthy Mother   . Rheum arthritis Maternal Grandmother   . Rheum arthritis Maternal Grandfather   . Asthma Paternal Grandmother   . Rheum arthritis Sister   . Rheum arthritis Brother   . Hearing loss Neg Hx   . Colon cancer Neg Hx   . Stomach cancer Neg Hx   . Pancreatic cancer Neg Hx     Social History Social History   Tobacco Use  . Smoking status: Never Smoker  . Smokeless tobacco: Never Used  Vaping Use  . Vaping Use: Never used  Substance Use Topics  . Alcohol use: No  . Drug use: No     Allergies   Patient has no known allergies.   Review of Systems Review of Systems  Skin: Positive for rash.  All other systems reviewed and are negative.    Physical Exam Triage Vital Signs ED Triage Vitals  Enc Vitals Group     BP 09/24/20 1533 114/77     Pulse Rate 09/24/20 1533 89     Resp  09/24/20 1533 17     Temp 09/24/20 1533 98.7 F (37.1 C)     Temp Source 09/24/20 1533 Oral     SpO2 09/24/20 1533 99 %     Weight --      Height --      Head Circumference --      Peak Flow --      Pain Score 09/24/20 1532 0     Pain Loc --      Pain Edu? --      Excl. in GC? --    No data found.  Updated Vital Signs BP 114/77 (BP Location: Right Arm)   Pulse 89   Temp 98.7 F (37.1 C) (Oral)   Resp 17   LMP  (Within Weeks) Comment: 3 weeks  SpO2 99%   Breastfeeding No   Visual Acuity Right Eye Distance:   Left Eye Distance:   Bilateral Distance:    Right Eye Near:   Left Eye Near:    Bilateral Near:     Physical Exam Vitals reviewed.  Constitutional:      General: She is not in acute distress.    Appearance: Normal appearance. She is not ill-appearing or diaphoretic.  HENT:     Head: Normocephalic and atraumatic.  Cardiovascular:     Rate and Rhythm: Normal rate and regular rhythm.     Heart sounds: Normal heart sounds.  Pulmonary:      Effort: Pulmonary effort is normal.     Breath sounds: Normal breath sounds.  Skin:    General: Skin is warm.     Capillary Refill: Capillary refill takes less than 2 seconds.     Comments: 1cm round area of erythema with scaling between 3rd and 4th fingers. Small patch overlying PIP 5th finger.  Range of motion fingers intact and without pain.  Cap refill less than 2 seconds.  Neurological:     General: No focal deficit present.     Mental Status: She is alert and oriented to person, place, and time.  Psychiatric:        Mood and Affect: Mood normal.        Behavior: Behavior normal.        Thought Content: Thought content normal.        Judgment: Judgment normal.      UC Treatments / Results  Labs (all labs ordered are listed, but only abnormal results are displayed) Labs Reviewed - No data to display  EKG   Radiology No results found.  Procedures Procedures (including critical care time)  Medications Ordered in UC Medications - No data to display  Initial Impression / Assessment and Plan / UC Course  I have reviewed the triage vital signs and the nursing notes.  Pertinent labs & imaging results that were available during my care of the patient were reviewed by me and considered in my medical decision making (see chart for details).     This patient is a 30 year old female presenting with dyshidrotic eczema right hand.  Afebrile, nontachycardic.  Triamcinolone ointment as below. Declines translation services as she speaks [place].  Final Clinical Impressions(s) / UC Diagnoses   Final diagnoses:  Dyshidrotic eczema     Discharge Instructions     Triamcinolone ointment twice daily for about 7 days.    ED Prescriptions    Medication Sig Dispense Auth. Provider   triamcinolone ointment (KENALOG) 0.5 % Apply 1 application topically 2 (two) times daily for 7 days. Apply to  affected area on hand twice daily for 7 days. 30 g Rhys Martini, PA-C     PDMP not  reviewed this encounter.   Rhys Martini, PA-C 09/24/20 1626

## 2020-09-27 IMAGING — US US MFM OB COMP + 14 WK
1 series · 13 of 28 positions shown · non-contrast
Comparison: none

[Series 1: us mfm ob comp + 14 wk · 13 of 62 slices shown]
[im 3/62]
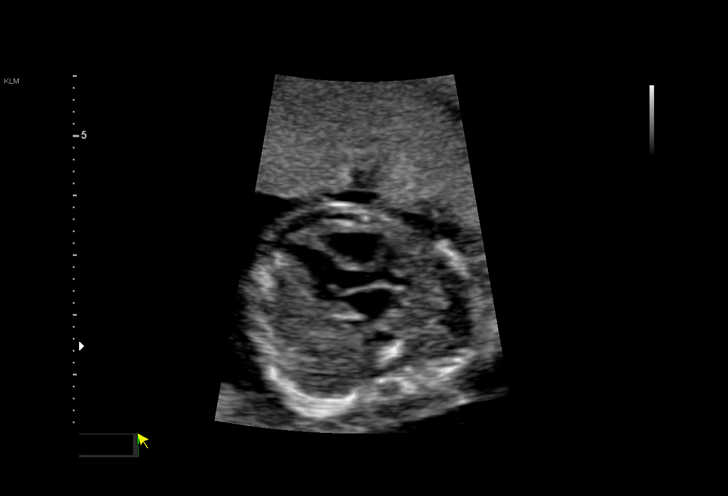
[im 7/62]
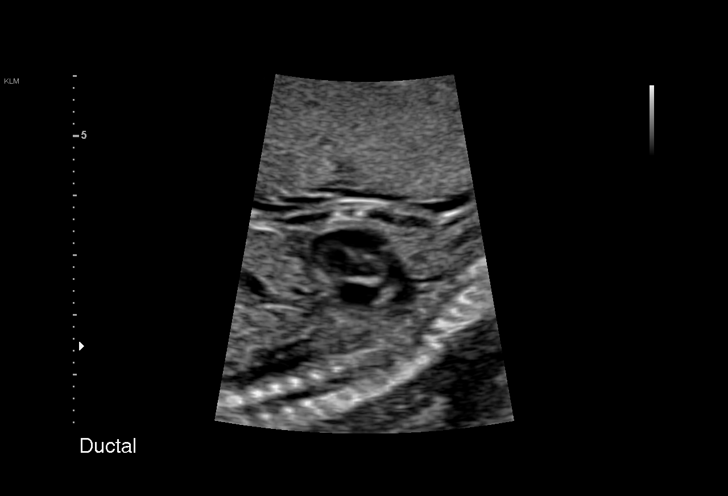
[im 12/62]
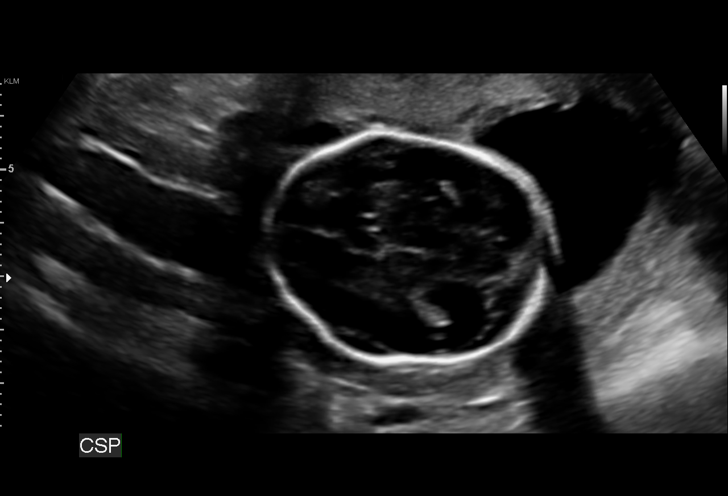
[im 16/62]
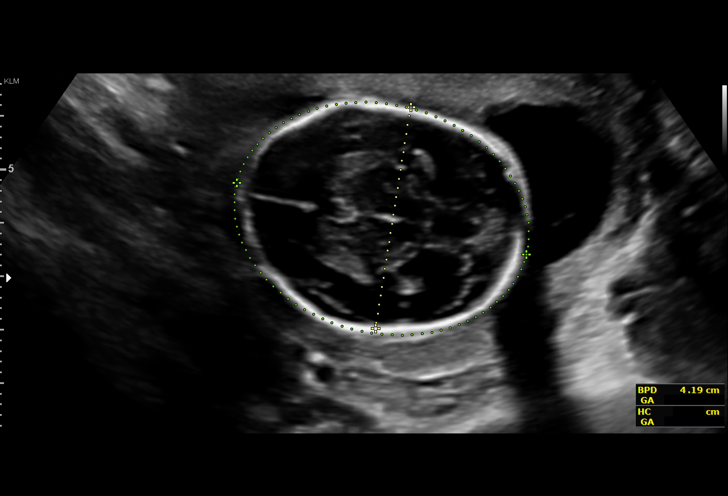
[im 21/62]
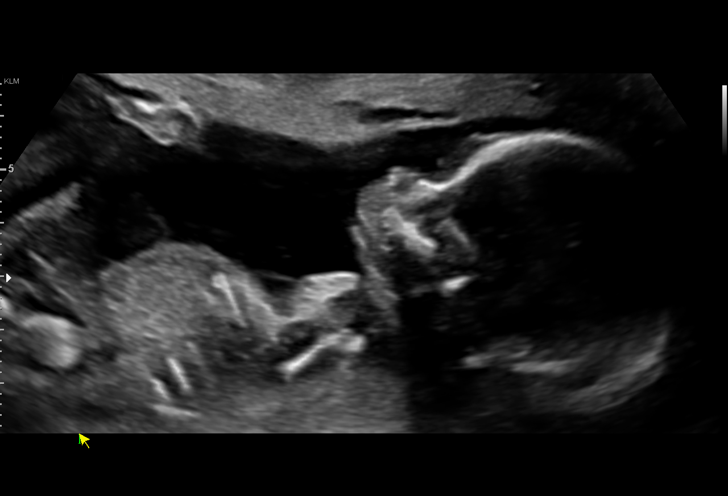
[im 25/62]
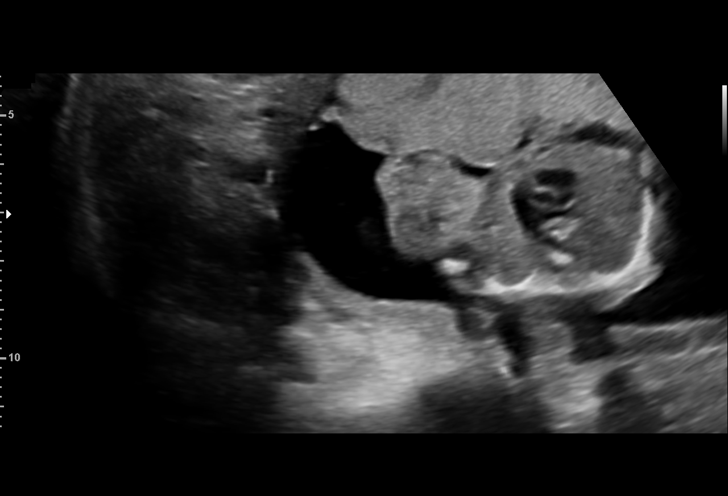
[im 32/62]
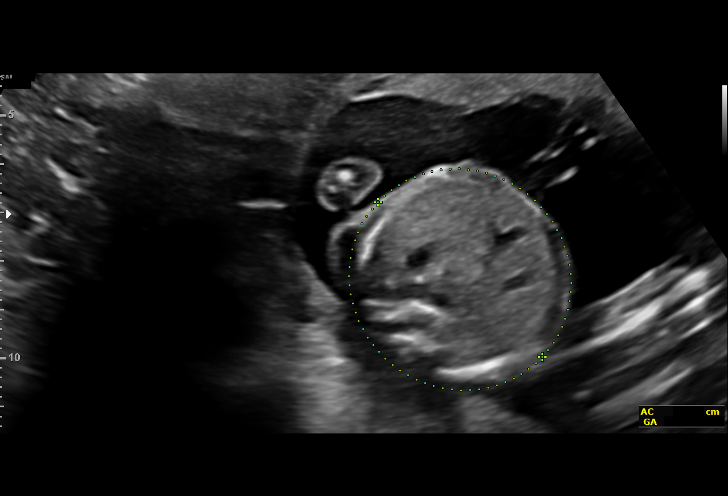
[im 37/62]
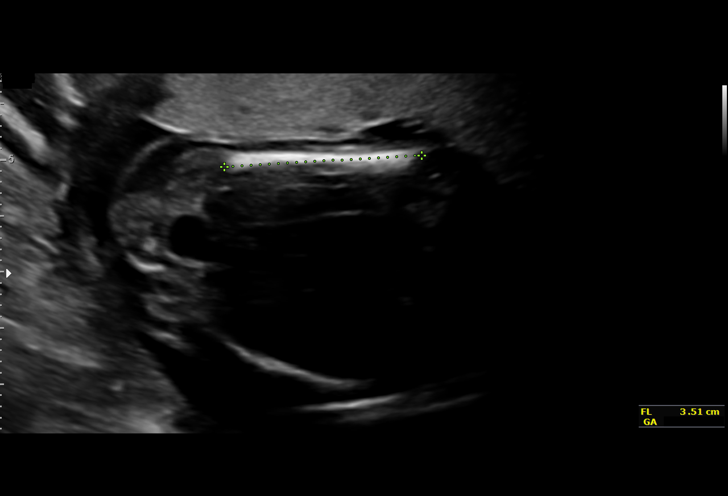
[im 41/62]
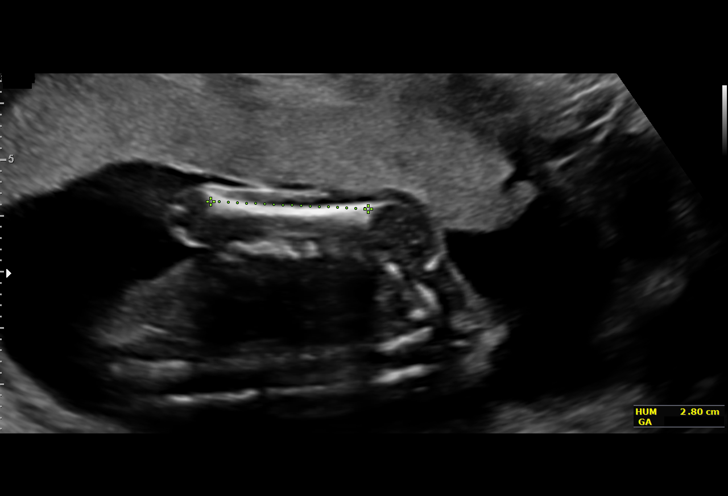
[im 46/62]
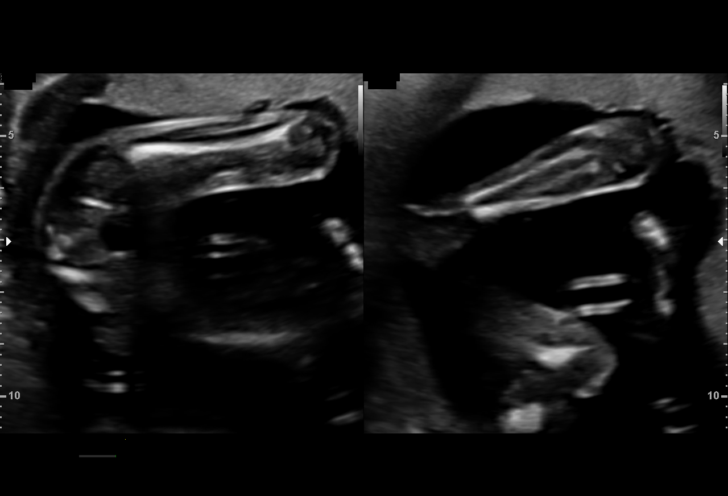
[im 50/62]
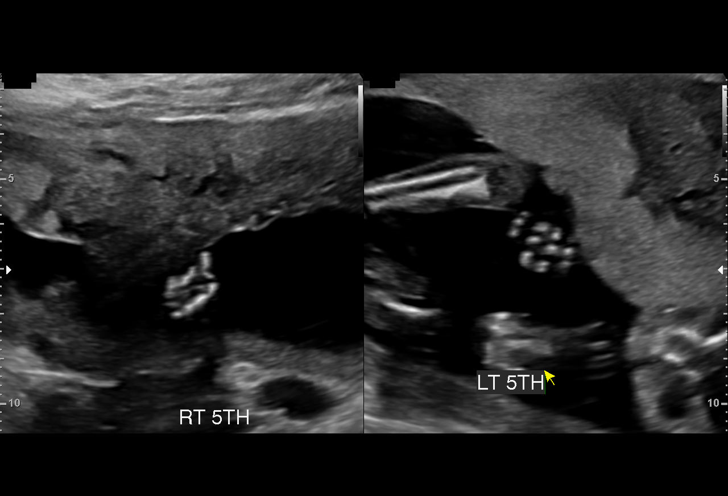
[im 55/62]
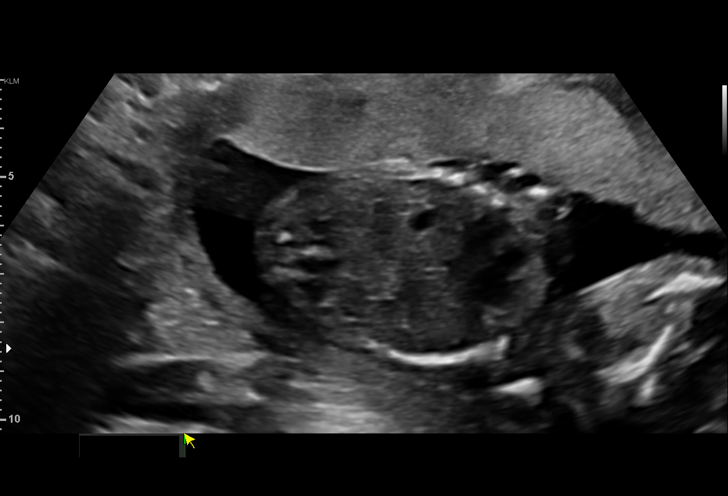
[im 59/62]
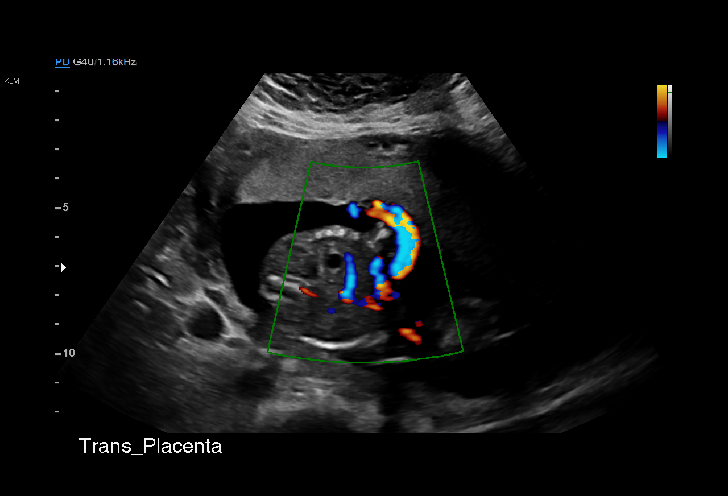

[13 of 28 positions shown; findings below may reference images not displayed]

CNM

 ----------------------------------------------------------------------

 ----------------------------------------------------------------------
Indications

  Encounter for antenatal screening for
  malformations (normal AFP, NIPS pending)
  19 weeks gestation of pregnancy
 ----------------------------------------------------------------------
Fetal Evaluation

 Num Of Fetuses:          1
 Fetal Heart Rate(bpm):   159
 Cardiac Activity:        Observed
 Presentation:            Cephalic
 Placenta:                Anterior
 P. Cord Insertion:       Visualized

 Amniotic Fluid
 AFI FV:      Within normal limits
Biometry

 BPD:      42.2  mm     G. Age:  18w 5d         41  %    CI:        73.09   %    70 - 86
                                                         FL/HC:       21.9  %    16.1 -
 HC:      156.9  mm     G. Age:  18w 5d         24  %    HC/AC:       1.10       1.09 -
 AC:      142.5  mm     G. Age:  19w 4d         66  %    FL/BPD:      81.3  %
 FL:       34.3  mm     G. Age:  20w 6d         93  %    FL/AC:       24.1  %    20 - 24
 HUM:      29.2  mm     G. Age:  19w 4d         64  %
 CER:      19.9  mm     G. Age:  19w 0d         49  %

 Est. FW:     323   gm   0 lb 11 oz      59  %
OB History

 Gravidity:    2         Term:   1        Prem:   0        SAB:   0
 TOP:          0       Ectopic:  0        Living: 1
Gestational Age

 LMP:           19w 0d        Date:  02/17/18                 EDD:   11/24/18
 U/S Today:     19w 3d                                        EDD:   11/21/18
 Best:          19w 0d     Det. By:  LMP  (02/17/18)          EDD:   11/24/18
Anatomy

 Cranium:               Appears normal         Aortic Arch:            Appears normal
 Cavum:                 Appears normal         Ductal Arch:            Appears normal
 Ventricles:            Appears normal         Diaphragm:              Appears normal
 Choroid Plexus:        Appears normal         Stomach:                Appears normal, left
                                                                       sided
 Cerebellum:            Appears normal         Abdomen:                Appears normal
 Posterior Fossa:       Appears normal         Abdominal Wall:         Appears nml (cord
                                                                       insert, abd wall)
 Nuchal Fold:           Appears normal         Cord Vessels:           Appears normal (3
                                                                       vessel cord)
 Face:                  Appears normal         Kidneys:                Appear normal
                        (orbits and profile)
 Lips:                  Appears normal         Bladder:                Appears normal
 Thoracic:              Appears normal         Spine:                  Not well visualized
 Heart:                 Appears normal         Upper Extremities:      Appears normal
                        (4CH, axis, and
                        situs)
 RVOT:                  Appears normal         Lower Extremities:      Appears normal
 LVOT:                  Appears normal

 Other:  Fetus appears to be a male. Heels visualized. Nasal bone visualized.
         Hands not well visualized.
Cervix Uterus Adnexa

 Cervix
 Length:            3.2  cm.
 Normal appearance by transabdominal scan.

 Adnexa
 No abnormality visualized.
Impression

 Normal interval growth.  No ultrasonic evidence of structural
 fetal anomalies.
 Suboptimal views of the fetal anatomy was observed
 secondary to fetal position.
Recommendations

 Repeat anatomy in 4 weeks.

## 2020-11-03 ENCOUNTER — Encounter: Payer: Medicaid Other | Admitting: Nurse Practitioner

## 2020-12-15 ENCOUNTER — Encounter: Payer: Medicaid Other | Admitting: Nurse Practitioner

## 2021-01-23 ENCOUNTER — Encounter: Payer: Medicaid Other | Admitting: Nurse Practitioner

## 2021-03-02 ENCOUNTER — Other Ambulatory Visit: Payer: Self-pay

## 2021-03-02 ENCOUNTER — Ambulatory Visit: Payer: Medicaid Other | Attending: Nurse Practitioner | Admitting: Nurse Practitioner

## 2021-03-02 ENCOUNTER — Encounter: Payer: Self-pay | Admitting: Nurse Practitioner

## 2021-03-02 VITALS — BP 94/68 | HR 95 | Ht <= 58 in | Wt 144.0 lb

## 2021-03-02 DIAGNOSIS — D72829 Elevated white blood cell count, unspecified: Secondary | ICD-10-CM

## 2021-03-02 DIAGNOSIS — Z23 Encounter for immunization: Secondary | ICD-10-CM

## 2021-03-02 DIAGNOSIS — Z0000 Encounter for general adult medical examination without abnormal findings: Secondary | ICD-10-CM

## 2021-03-02 NOTE — Progress Notes (Signed)
Assessment & Plan:  Shelagh was seen today for annual exam.  Diagnoses and all orders for this visit:  Encounter for annual physical exam -     CMP14+EGFR  Leukocytosis, unspecified type -     CBC with Differential  Need for immunization against influenza -     Flu Vaccine QUAD 50moIM (Fluarix, Fluzone & Alfiuria Quad PF)   Patient has been counseled on age-appropriate routine health concerns for screening and prevention. These are reviewed and up-to-date. Referrals have been placed accordingly. Immunizations are up-to-date or declined.    Subjective:   Chief Complaint  Patient presents with   Annual Exam   HPI MRamie Palladino89y.o. female presents to office today for annual physical.     Review of Systems  Constitutional:  Negative for fever, malaise/fatigue and weight loss.  HENT: Negative.  Negative for nosebleeds.   Eyes: Negative.  Negative for blurred vision, double vision and photophobia.  Respiratory: Negative.  Negative for cough and shortness of breath.   Cardiovascular: Negative.  Negative for chest pain, palpitations and leg swelling.  Gastrointestinal: Negative.  Negative for heartburn, nausea and vomiting.  Genitourinary: Negative.   Musculoskeletal: Negative.  Negative for myalgias.  Skin: Negative.   Neurological: Negative.  Negative for dizziness, focal weakness, seizures and headaches.  Endo/Heme/Allergies: Negative.   Psychiatric/Behavioral: Negative.  Negative for suicidal ideas.    Past Medical History:  Diagnosis Date   Medical history non-contributory     Past Surgical History:  Procedure Laterality Date   NO PAST SURGERIES      Family History  Problem Relation Age of Onset   Healthy Mother    Rheum arthritis Maternal Grandmother    Rheum arthritis Maternal Grandfather    Asthma Paternal Grandmother    Rheum arthritis Sister    Rheum arthritis Brother    Hearing loss Neg Hx    Colon cancer Neg Hx    Stomach cancer Neg Hx     Pancreatic cancer Neg Hx     Social History Reviewed with no changes to be made today.   Outpatient Medications Prior to Visit  Medication Sig Dispense Refill   famotidine (PEPCID) 20 MG tablet Take 1 tablet (20 mg total) by mouth 2 (two) times daily. (Patient not taking: Reported on 03/02/2021) 14 tablet 0   lubiprostone (AMITIZA) 24 MCG capsule Take 1 capsule (24 mcg total) by mouth 2 (two) times daily with a meal. (Patient not taking: No sig reported) 60 capsule 11   ondansetron (ZOFRAN ODT) 4 MG disintegrating tablet Take 1 tablet (4 mg total) by mouth every 8 (eight) hours as needed for nausea or vomiting. (Patient not taking: Reported on 03/02/2021) 20 tablet 0   sucralfate (CARAFATE) 1 g tablet Take 1 tablet (1 g total) by mouth 4 (four) times daily -  with meals and at bedtime. (Patient not taking: Reported on 03/02/2021) 60 tablet 0   No facility-administered medications prior to visit.    No Known Allergies     Objective:    BP 94/68   Pulse 95   Ht 4' 5" (1.346 m)   Wt 144 lb (65.3 kg)   LMP 02/25/2021   SpO2 97%   BMI 36.04 kg/m  Wt Readings from Last 3 Encounters:  03/02/21 144 lb (65.3 kg)  07/24/20 149 lb (67.6 kg)  03/31/20 148 lb (67.1 kg)    Physical Exam Constitutional:      Appearance: She is well-developed.  HENT:  Head: Normocephalic and atraumatic.     Right Ear: External ear normal.     Left Ear: External ear normal.     Nose: Nose normal.     Mouth/Throat:     Pharynx: No oropharyngeal exudate.  Eyes:     General: No scleral icterus.       Right eye: No discharge.     Extraocular Movements:     Right eye: Abnormal extraocular motion present. No nystagmus.     Left eye: Abnormal extraocular motion present. No nystagmus.     Conjunctiva/sclera: Conjunctivae normal.     Pupils: Pupils are equal, round, and reactive to light.  Neck:     Thyroid: No thyromegaly.     Trachea: No tracheal deviation.  Cardiovascular:     Rate and Rhythm:  Normal rate and regular rhythm.     Heart sounds: Normal heart sounds. No murmur heard.   No friction rub.  Pulmonary:     Effort: Pulmonary effort is normal. No accessory muscle usage or respiratory distress.     Breath sounds: Normal breath sounds. No decreased breath sounds, wheezing, rhonchi or rales.  Chest:     Chest wall: No mass or tenderness.  Breasts:    Breasts are symmetrical.     Right: Normal. No inverted nipple, mass, nipple discharge, skin change or tenderness.     Left: Normal. No inverted nipple, mass, nipple discharge, skin change or tenderness.  Abdominal:     General: Bowel sounds are normal. There is no distension.     Palpations: Abdomen is soft. There is no mass.     Tenderness: There is no abdominal tenderness. There is no guarding or rebound.  Musculoskeletal:        General: No tenderness or deformity. Normal range of motion.     Cervical back: Normal range of motion and neck supple.  Lymphadenopathy:     Cervical: No cervical adenopathy.     Upper Body:     Right upper body: No supraclavicular, axillary or pectoral adenopathy.     Left upper body: No supraclavicular, axillary or pectoral adenopathy.  Skin:    General: Skin is warm and dry.     Findings: No erythema.  Neurological:     Mental Status: She is alert and oriented to person, place, and time.     Cranial Nerves: No cranial nerve deficit.     Sensory: Sensation is intact.     Motor: Motor function is intact.     Coordination: Coordination is intact. Coordination normal.     Gait: Gait is intact.     Deep Tendon Reflexes:     Reflex Scores:      Patellar reflexes are 0 on the right side and 0 on the left side. Psychiatric:        Speech: Speech normal.        Behavior: Behavior normal.        Thought Content: Thought content normal.        Judgment: Judgment normal.         Patient has been counseled extensively about nutrition and exercise as well as the importance of adherence with  medications and regular follow-up. The patient was given clear instructions to go to ER or return to medical center if symptoms don't improve, worsen or new problems develop. The patient verbalized understanding.   Follow-up: Return if symptoms worsen or fail to improve.   Gildardo Pounds, FNP-BC [facility]  [place], [place]   03/02/2021, 10:39 AM

## 2021-03-03 LAB — CMP14+EGFR
ALT: 31 IU/L (ref 0–32)
AST: 19 IU/L (ref 0–40)
Albumin/Globulin Ratio: 1.6 (ref 1.2–2.2)
Albumin: 4.3 g/dL (ref 3.9–5.0)
Alkaline Phosphatase: 110 IU/L (ref 44–121)
BUN/Creatinine Ratio: 12 (ref 9–23)
BUN: 7 mg/dL (ref 6–20)
Bilirubin Total: 0.3 mg/dL (ref 0.0–1.2)
CO2: 23 mmol/L (ref 20–29)
Calcium: 9.4 mg/dL (ref 8.7–10.2)
Chloride: 101 mmol/L (ref 96–106)
Creatinine, Ser: 0.59 mg/dL (ref 0.57–1.00)
Globulin, Total: 2.7 g/dL (ref 1.5–4.5)
Glucose: 87 mg/dL (ref 70–99)
Potassium: 4.6 mmol/L (ref 3.5–5.2)
Sodium: 138 mmol/L (ref 134–144)
Total Protein: 7 g/dL (ref 6.0–8.5)
eGFR: 126 mL/min/{1.73_m2} (ref >59)

## 2021-07-02 ENCOUNTER — Ambulatory Visit: Payer: Self-pay | Admitting: *Deleted

## 2021-07-02 NOTE — Telephone Encounter (Signed)
Reason for Disposition ? [1] MILD-MODERATE pain AND [2] constant AND [3] present > 2 hours ? ?Answer Assessment - Initial Assessment Questions ?1. LOCATION: "Where does it hurt?"  ?    R side ?2. RADIATION: "Does the pain shoot anywhere else?" (e.g., chest, back) ?    R side ?3. ONSET: "When did the pain begin?" (e.g., minutes, hours or days ago)  ?    15 days ?4. SUDDEN: "Gradual or sudden onset?" ?    Comes and goes ?5. PATTERN "Does the pain come and go, or is it constant?" ?   - If constant: "Is it getting better, staying the same, or worsening?"  ?    (Note: Constant means the pain never goes away completely; most serious pain is constant and it progresses)  ?   - If intermittent: "How long does it last?" "Do you have pain now?" ?    (Note: Intermittent means the pain goes away completely between bouts) ?    Same- comes and goes ?6. SEVERITY: "How bad is the pain?"  (e.g., Scale 1-10; mild, moderate, or severe) ?  - MILD (1-3): doesn't interfere with normal activities, abdomen soft and not tender to touch  ?  - MODERATE (4-7): interferes with normal activities or awakens from sleep, abdomen tender to touch  ?  - SEVERE (8-10): excruciating pain, doubled over, unable to do any normal activities  ?    Moderate/severe  ?7. RECURRENT SYMPTOM: "Have you ever had this type of stomach pain before?" If Yes, ask: "When was the last time?" and "What happened that time?"  ?    same as before gall bladder removed ?8. CAUSE: "What do you think is causing the stomach pain?" ?    Not sure ?9. RELIEVING/AGGRAVATING FACTORS: "What makes it better or worse?" (e.g., movement, antacids, bowel movement) ?    tylenol ?10. OTHER SYMPTOMS: "Do you have any other symptoms?" (e.g., back pain, diarrhea, fever, urination pain, vomiting) ?      Eating causes the pain ?11. PREGNANCY: "Is there any chance you are pregnant?" "When was your last menstrual period?" ?      No - LMP- 2/3- irregular, UPT last week negative ? ?Protocols used:  Abdominal Pain - Female-A-AH ? ?

## 2021-07-02 NOTE — Telephone Encounter (Signed)
?  Chief Complaint: abdominal pain ?Symptoms: R abdominal pain, pain with eating ?Frequency: 15 days ?Pertinent Negatives: Patient denies back pain, diarrhea, fever, urination pain, vomiting ?Disposition: [] ED /[x] Urgent Care (no appt availability in office) / [] Appointment(In office/virtual)/ []  [facility]/ [] Home Care/ [] Refused Recommended Disposition /[] [facility]/ []  Follow-up with PCP ?Additional Notes: Patient advised UC/mobile unit if can not go to UC today- patient advised follow up in office  ?

## 2021-07-03 NOTE — Telephone Encounter (Signed)
Scheduled appt at [facility] 07/04/2021 ?

## 2021-07-04 ENCOUNTER — Other Ambulatory Visit: Payer: Self-pay

## 2021-07-04 ENCOUNTER — Ambulatory Visit (INDEPENDENT_AMBULATORY_CARE_PROVIDER_SITE_OTHER): Payer: Medicaid Other | Admitting: Nurse Practitioner

## 2021-07-04 ENCOUNTER — Encounter: Payer: Self-pay | Admitting: Nurse Practitioner

## 2021-07-04 VITALS — BP 111/77 | HR 98 | Temp 97.9°F | Resp 16 | Wt 152.0 lb

## 2021-07-04 DIAGNOSIS — Z3202 Encounter for pregnancy test, result negative: Secondary | ICD-10-CM | POA: Diagnosis not present

## 2021-07-04 DIAGNOSIS — R1031 Right lower quadrant pain: Secondary | ICD-10-CM

## 2021-07-04 LAB — POCT URINALYSIS DIP (CLINITEK)
Bilirubin, UA: NEGATIVE
Blood, UA: NEGATIVE
Glucose, UA: NEGATIVE mg/dL
Ketones, POC UA: NEGATIVE mg/dL
Nitrite, UA: NEGATIVE
POC PROTEIN,UA: NEGATIVE
Spec Grav, UA: 1.01 (ref 1.010–1.025)
Urobilinogen, UA: 0.2 E.U./dL
pH, UA: 6.5 (ref 5.0–8.0)

## 2021-07-04 LAB — POCT URINE PREGNANCY: Preg Test, Ur: NEGATIVE

## 2021-07-04 MED ORDER — SULFAMETHOXAZOLE-TRIMETHOPRIM 800-160 MG PO TABS: 1.0000 | ORAL_TABLET | Freq: Two times a day (BID) | ORAL | 0 refills | Status: AC

## 2021-07-04 NOTE — Progress Notes (Signed)
? ?@Patient  ID: Kelsey JabsMadhura Fidalgo, female    DOB: April 12, 1991, 31 y.o.   MRN: [id number] ? ?Chief Complaint  ?Patient presents with  ? Abdominal Pain  ? ? ?Referring provider: ?Claiborne RiggFleming, Zelda W, NP ? ?HPI ? ?Patient presents today with right lower quadrant pain.  She states that this has been an issue for about 3 months.  UA showed trace leukocytes.  We will order Bactrim.  We will order blood work and refer patient to GI. Denies f/c/s, n/v/d, hemoptysis, PND, chest pain or edema. ? ? ? ?Gall bladder surgery last year ? ?Right side abdominal pain x 3 months ? ?No vomting ? ?Constipation ? ?No blood in stools ? ?Regular periods - feb 26 - irregular periods ? ? ? ? ? ?  ? ? ? ? ? ?No Known Allergies ? ?Immunization History  ?Administered Date(s) Administered  ? Influenza,inj,Quad PF,6+ Mos 02/23/2013, 12/18/2015, 01/01/2018, 03/02/2021  ? Tdap 03/23/2013, 09/01/2018  ? ? ?Past Medical History:  ?Diagnosis Date  ? Medical history non-contributory   ? ? ?Tobacco History: ?Social History  ? ?Tobacco Use  ?Smoking Status Never  ?Smokeless Tobacco Never  ? ?Counseling given: Not Answered ? ? ?Outpatient Encounter Medications as of 07/04/2021  ?Medication Sig  ? [EXPIRED] sulfamethoxazole-trimethoprim (BACTRIM DS) 800-160 MG tablet Take 1 tablet by mouth 2 (two) times daily for 3 days.  ? ?No facility-administered encounter medications on file as of 07/04/2021.  ? ? ? ?Review of Systems ? ?Review of Systems  ?Constitutional: Negative.   ?HENT: Negative.    ?Cardiovascular: Negative.   ?Gastrointestinal: Negative.   ?Allergic/Immunologic: Negative.   ?Neurological: Negative.   ?Psychiatric/Behavioral: Negative.     ? ? ? ?Physical Exam ? ?BP 111/77   Pulse 98   Temp 97.9 ?F (36.6 ?C) (Oral)   Resp 16   Wt 152 lb (68.9 kg)   LMP 06/12/2021   SpO2 100%   BMI 38.04 kg/m?  ? ?Wt Readings from Last 5 Encounters:  ?07/13/21 151 lb 6.4 oz (68.7 kg)  ?07/04/21 152 lb (68.9 kg)  ?03/02/21 144 lb (65.3 kg)  ?07/24/20 149 lb (67.6 kg)   ?03/31/20 148 lb (67.1 kg)  ? ? ? ?Physical Exam ?Vitals and nursing note reviewed.  ?Constitutional:   ?   General: She is not in acute distress. ?   Appearance: She is well-developed.  ?Cardiovascular:  ?   Rate and Rhythm: Normal rate and regular rhythm.  ?Pulmonary:  ?   Effort: Pulmonary effort is normal.  ?   Breath sounds: Normal breath sounds.  ?Abdominal:  ?   Tenderness: There is abdominal tenderness in the right lower quadrant.  ?Neurological:  ?   Mental Status: She is alert and oriented to person, place, and time.  ? ? ? ?Lab Results: ? ?CBC ?   ?Component Value Date/Time  ? WBC 13.1 (H) 07/04/2021 1617  ? WBC 11.0 (H) 08/10/2020 1420  ? RBC 5.01 07/04/2021 1617  ? RBC 4.48 08/10/2020 1420  ? HGB 13.4 07/04/2021 1617  ? HCT 41.2 07/04/2021 1617  ? PLT 487 (H) 07/04/2021 1617  ? MCV 82 07/04/2021 1617  ? MCH 26.7 07/04/2021 1617  ? MCH 26.8 08/10/2020 1420  ? MCHC 32.5 07/04/2021 1617  ? MCHC 31.3 08/10/2020 1420  ? RDW 13.3 07/04/2021 1617  ? LYMPHSABS 1.9 08/10/2020 1420  ? LYMPHSABS 2.3 05/11/2018 1444  ? MONOABS 0.5 08/10/2020 1420  ? EOSABS 0.3 08/10/2020 1420  ? EOSABS 0.3 05/11/2018 1444  ?  BASOSABS 0.1 08/10/2020 1420  ? BASOSABS 0.1 05/11/2018 1444  ? ? ?BMET ?   ?Component Value Date/Time  ? NA 138 07/04/2021 1617  ? K 4.5 07/04/2021 1617  ? CL 100 07/04/2021 1617  ? CO2 28 07/04/2021 1617  ? GLUCOSE 89 07/04/2021 1617  ? GLUCOSE 119 (H) 08/10/2020 1420  ? BUN 8 07/04/2021 1617  ? CREATININE 0.56 (L) 07/04/2021 1617  ? CREATININE 0.73 12/18/2015 1634  ? CALCIUM 9.6 07/04/2021 1617  ? GFRNONAA >60 08/10/2020 1420  ? [facility] >89 09/20/2014 1538  ? GFRAA 144 08/03/2019 1029  ? GFRAA >89 09/20/2014 1538  ? ? ?BNP ?No results found for: BNP ? ?ProBNP ?No results found for: PROBNP ? ?Imaging: ?US Abdomen Complete ? ?Result Date: 07/15/2021 ?CLINICAL DATA:  Right upper quadrant pain EXAM: ABDOMEN ULTRASOUND COMPLETE COMPARISON:  08/10/2020 FINDINGS: Gallbladder: Surgically removed Common bile duct:  Diameter: 4.2 mm. Liver: Increased echogenicity is noted consistent with fatty infiltration. No focal mass is noted. Portal vein is patent on color Doppler imaging with normal direction of blood flow towards the liver. IVC: No abnormality visualized. Pancreas: Visualized portion unremarkable. Spleen: Size and appearance within normal limits. Right Kidney: Length: 9.9 cm. Echogenicity within normal limits. No mass or hydronephrosis visualized. Left Kidney: Length: 10.4 cm. Echogenicity within normal limits. No mass or hydronephrosis visualized. Abdominal aorta: No aneurysm visualized. Other findings: Minimal free fluid is noted within the abdomen. IMPRESSION: Status post cholecystectomy. Fatty liver. Trace ascites.  This may be physiologic in nature. Electronically Signed   By: Alcide Clever M.D.   On: 07/15/2021 01:49   ? ? ?Assessment & Plan:  ? ?Right lower quadrant abdominal pain ?- POCT URINALYSIS DIP (CLINITEK) ?- POCT urine pregnancy ?- CBC ?- Comprehensive metabolic panel ?- Ambulatory referral to Gastroenterology ? ?Will order bactrim  - no showed trace leuk ? ? ?Follow up: ?Follow up with PCP ? ?Patient Instructions  ?1. Right lower quadrant abdominal pain ? ?- POCT URINALYSIS DIP (CLINITEK) ?- POCT urine pregnancy ?- CBC ?- Comprehensive metabolic panel ?- Ambulatory referral to Gastroenterology ? ?Will order bactrim  - no showed trace leuk ? ? ?Follow up: ?Follow up with PCP ? ?Abdominal Pain, Adult ?Many things can cause belly (abdominal) pain. Most times, belly pain is not dangerous. Many cases of belly pain can be watched and treated at home. Sometimes, though, belly pain is serious. Your doctor will try to find the cause of your belly pain. ?Follow these instructions at home: ?Medicines ?Take over-the-counter and prescription medicines only as told by your doctor. ?Do not take medicines that help you poop (laxatives) unless told by your doctor. ?General instructions ?Watch your belly pain for any  changes. ?Drink enough fluid to keep your pee (urine) pale yellow. ?Keep all follow-up visits as told by your doctor. This is important. ?Contact a doctor if: ?Your belly pain changes or gets worse. ?You are not hungry, or you lose weight without trying. ?You are having trouble pooping (constipated) or have watery poop (diarrhea) for more than 2-3 days. ?You have pain when you pee or poop. ?Your belly pain wakes you up at night. ?Your pain gets worse with meals, after eating, or with certain foods. ?You are vomiting and cannot keep anything down. ?You have a fever. ?You have blood in your pee. ?Get help right away if: ?Your pain does not go away as soon as your doctor says it should. ?You cannot stop vomiting. ?Your pain is only in areas of your  belly, such as the right side or the left lower part of the belly. ?You have bloody or black poop, or poop that looks like tar. ?You have very bad pain, cramping, or bloating in your belly. ?You have signs of not having enough fluid or water in your body (dehydration), such as: ?Dark pee, very little pee, or no pee. ?Cracked lips. ?Dry mouth. ?Sunken eyes. ?Sleepiness. ?Weakness. ?You have trouble breathing or chest pain. ?Summary ?Many cases of belly pain can be watched and treated at home. ?Watch your belly pain for any changes. ?Take over-the-counter and prescription medicines only as told by your doctor. ?Contact a doctor if your belly pain changes or gets worse. ?Get help right away if you have very bad pain, cramping, or bloating in your belly. ?This information is not intended to replace advice given to you by your health care provider. Make sure you discuss any questions you have with your health care provider. ?Document Revised: 08/17/2018 Document Reviewed: 08/17/2018 ?Elsevier Patient Education ? 2022 Elsevier Inc. ? ? ? ? ?Ivonne Andrew, NP ?07/16/2021 ? ? ?

## 2021-07-04 NOTE — Progress Notes (Signed)
Abdominal pain x 3 mos.  ?[place] on right side ? ?Denies N/V/D ?

## 2021-07-04 NOTE — Patient Instructions (Addendum)
1. Right lower quadrant abdominal pain ? ?- POCT URINALYSIS DIP (CLINITEK) ?- POCT urine pregnancy ?- CBC ?- Comprehensive metabolic panel ?- Ambulatory referral to Gastroenterology ? ?Will order bactrim  - no showed trace leuk ? ? ?Follow up: ?Follow up with PCP ? ?Abdominal Pain, Adult ?Many things can cause belly (abdominal) pain. Most times, belly pain is not dangerous. Many cases of belly pain can be watched and treated at home. Sometimes, though, belly pain is serious. Your doctor will try to find the cause of your belly pain. ?Follow these instructions at home: ?Medicines ?Take over-the-counter and prescription medicines only as told by your doctor. ?Do not take medicines that help you poop (laxatives) unless told by your doctor. ?General instructions ?Watch your belly pain for any changes. ?Drink enough fluid to keep your pee (urine) pale yellow. ?Keep all follow-up visits as told by your doctor. This is important. ?Contact a doctor if: ?Your belly pain changes or gets worse. ?You are not hungry, or you lose weight without trying. ?You are having trouble pooping (constipated) or have watery poop (diarrhea) for more than 2-3 days. ?You have pain when you pee or poop. ?Your belly pain wakes you up at night. ?Your pain gets worse with meals, after eating, or with certain foods. ?You are vomiting and cannot keep anything down. ?You have a fever. ?You have blood in your pee. ?Get help right away if: ?Your pain does not go away as soon as your doctor says it should. ?You cannot stop vomiting. ?Your pain is only in areas of your belly, such as the right side or the left lower part of the belly. ?You have bloody or black poop, or poop that looks like tar. ?You have very bad pain, cramping, or bloating in your belly. ?You have signs of not having enough fluid or water in your body (dehydration), such as: ?Dark pee, very little pee, or no pee. ?Cracked lips. ?Dry mouth. ?Sunken eyes. ?Sleepiness. ?Weakness. ?You have  trouble breathing or chest pain. ?Summary ?Many cases of belly pain can be watched and treated at home. ?Watch your belly pain for any changes. ?Take over-the-counter and prescription medicines only as told by your doctor. ?Contact a doctor if your belly pain changes or gets worse. ?Get help right away if you have very bad pain, cramping, or bloating in your belly. ?This information is not intended to replace advice given to you by your health care provider. Make sure you discuss any questions you have with your health care provider. ?Document Revised: 08/17/2018 Document Reviewed: 08/17/2018 ?Elsevier Patient Education ? 2022 Elsevier Inc. ? ?

## 2021-07-05 ENCOUNTER — Telehealth: Payer: Self-pay | Admitting: Nurse Practitioner

## 2021-07-05 LAB — COMPREHENSIVE METABOLIC PANEL
ALT: 53 IU/L — ABNORMAL HIGH (ref 0–32)
AST: 26 IU/L (ref 0–40)
Albumin/Globulin Ratio: 2 (ref 1.2–2.2)
Albumin: 4.7 g/dL (ref 3.9–5.0)
Alkaline Phosphatase: 111 IU/L (ref 44–121)
BUN/Creatinine Ratio: 14 (ref 9–23)
BUN: 8 mg/dL (ref 6–20)
Bilirubin Total: <0.2 mg/dL (ref 0.0–1.2)
CO2: 28 mmol/L (ref 20–29)
Calcium: 9.6 mg/dL (ref 8.7–10.2)
Chloride: 100 mmol/L (ref 96–106)
Creatinine, Ser: 0.56 mg/dL — ABNORMAL LOW (ref 0.57–1.00)
Globulin, Total: 2.4 g/dL (ref 1.5–4.5)
Glucose: 89 mg/dL (ref 70–99)
Potassium: 4.5 mmol/L (ref 3.5–5.2)
Sodium: 138 mmol/L (ref 134–144)
Total Protein: 7.1 g/dL (ref 6.0–8.5)
eGFR: 127 mL/min/{1.73_m2} (ref >59)

## 2021-07-05 LAB — CBC
Hematocrit: 41.2 % (ref 34.0–46.6)
Hemoglobin: 13.4 g/dL (ref 11.1–15.9)
MCH: 26.7 pg (ref 26.6–33.0)
MCHC: 32.5 g/dL (ref 31.5–35.7)
MCV: 82 fL (ref 79–97)
Platelets: 487 10*3/uL — ABNORMAL HIGH (ref 150–450)
RBC: 5.01 x10E6/uL (ref 3.77–5.28)
RDW: 13.3 % (ref 11.7–15.4)
WBC: 13.1 10*3/uL — ABNORMAL HIGH (ref 3.4–10.8)

## 2021-07-05 NOTE — Telephone Encounter (Signed)
Sent pt a mychart message. 

## 2021-07-12 ENCOUNTER — Ambulatory Visit (HOSPITAL_COMMUNITY)
Admission: RE | Admit: 2021-07-12 | Discharge: 2021-07-12 | Disposition: A | Discharge status: Home / Self Care | Payer: Medicaid Other | Source: Ambulatory Visit | Attending: Nurse Practitioner | Admitting: Nurse Practitioner

## 2021-07-12 ENCOUNTER — Other Ambulatory Visit: Payer: Self-pay

## 2021-07-12 DIAGNOSIS — R1031 Right lower quadrant pain: Secondary | ICD-10-CM | POA: Diagnosis not present

## 2021-07-13 ENCOUNTER — Ambulatory Visit (INDEPENDENT_AMBULATORY_CARE_PROVIDER_SITE_OTHER): Payer: Medicaid Other | Admitting: Physician Assistant

## 2021-07-13 ENCOUNTER — Encounter: Payer: Self-pay | Admitting: Physician Assistant

## 2021-07-13 VITALS — BP 100/60 | HR 99 | Ht 59.5 in | Wt 151.4 lb

## 2021-07-13 DIAGNOSIS — R1010 Upper abdominal pain, unspecified: Secondary | ICD-10-CM

## 2021-07-13 DIAGNOSIS — K5909 Other constipation: Secondary | ICD-10-CM | POA: Diagnosis not present

## 2021-07-13 NOTE — Patient Instructions (Signed)
Will reach out once we have reviewed ultrasound results with next steps. ? ?If you are age 29 or older, your body mass index should be between 23-30. Your Body mass index is 30.07 kg/m?Marland Kitchen If this is out of the aforementioned range listed, please consider follow up with your Primary Care Provider. ? ?If you are age 34 or younger, your body mass index should be between 19-25. Your Body mass index is 30.07 kg/m?Marland Kitchen If this is out of the aformentioned range listed, please consider follow up with your Primary Care Provider.  ? ?________________________________________________________ ? ?The [facility] providers would like to encourage you to use [facility] to communicate with providers for non-urgent requests or questions.  Due to long hold times on the telephone, sending your provider a message by [facility] may be a faster and more efficient way to get a response.  Please allow 48 business hours for a response.  Please remember that this is for non-urgent requests.  ?_______________________________________________________ ? ?

## 2021-07-13 NOTE — Progress Notes (Signed)
? ?Chief Complaint: Abdominal pain ? ?HPI: ?   Kelsey Davies is a 31 year old female from [place], known to Dr. Lavon Paganini, who was referred to me by Claiborne Rigg, NP for a complaint of abdominal pain ?   03/31/2020 office visit with Dr. Lavon Paganini for right upper quadrant abdominal pain and chronic constipation.  At that time patient was switched to Amitiza 24 mcg twice daily instead of Linzess which caused abdominal cramping.  Discussed epigastric and right upper quadrant pain and thought this could be secondary to symptomatic gallstones given she had a 1.5 cm gallstone ultrasound in 2015.  She was referred to [facility]. ?   07/04/2021 patient seen by PCP and at that time described gallbladder surgery a year ago and right-sided abdominal pain for the past 3 months with constipation.  Labs at that time ordered CBC and CMP as well as urinalysis.  She was given Bactrim for trace leukocytes.  CMP with minimally elevated ALT at 53 and CBC with elevated white count at 13.1 ?   07/12/2021 patient had abdominal ultrasound.  Results are not available yet. ?   Today, the patient is seen with her family member who helps interpret, tells me that she saw Dr. Lavon Paganini last year for a right sided abdominal pain and this is unchanged over the past year.  Apparently had her gallbladder out last year but nothing changed afterwards.  She describes this pain actually on both sides of her abdomen really in both upper quadrants.  Tells me it feels like "stones in there", she feels it worse immediately after eating or drinking or sometimes when she is going for a walk.  It is constant and rated as a 8/10.  Nothing seems to make it better.  She was on Amitiza last year for constipation and felt this helped with her constipation but even then continued with pain.  Currently having a bowel movement once every 2 or 3 days and does not feel constipated.  Was recently on antibiotics (Bactrim) for a UTI she finished these but this did not change her  symptoms at all either. ?   Denies fever, chills, nausea, vomiting, heartburn, reflux, weight loss or blood in her stool. ? ?Past Medical History:  ?Diagnosis Date  ? Medical history non-contributory   ? ?Past Surgical History: ?Cholecystectomy ? ?No current outpatient medications on file.  ? ?No current facility-administered medications for this visit.  ? ? ?Allergies as of 07/13/2021  ? (No Known Allergies)  ? ? ?Family History  ?Problem Relation Age of Onset  ? Healthy Mother   ? Rheum arthritis Maternal Grandmother   ? Rheum arthritis Maternal Grandfather   ? Asthma Paternal Grandmother   ? Rheum arthritis Sister   ? Rheum arthritis Brother   ? Hearing loss Neg Hx   ? Colon cancer Neg Hx   ? Stomach cancer Neg Hx   ? Pancreatic cancer Neg Hx   ? ? ?Social History  ? ?Socioeconomic History  ? Marital status: Married  ?  Spouse name: Not on file  ? Number of children: Not on file  ? Years of education: Not on file  ? Highest education level: Not on file  ?Occupational History  ? Not on file  ?Tobacco Use  ? Smoking status: Never  ? Smokeless tobacco: Never  ?Vaping Use  ? Vaping Use: Never used  ?Substance and Sexual Activity  ? Alcohol use: No  ? Drug use: No  ? Sexual activity: Yes  ?  Birth control/protection: None  ?Other Topics Concern  ? Not on file  ?Social History Narrative  ? Not on file  ? ?Social Determinants of Health  ? ?Financial Resource Strain: Not on file  ?Food Insecurity: Not on file  ?Transportation Needs: Not on file  ?Physical Activity: Not on file  ?Stress: Not on file  ?Social Connections: Not on file  ?Intimate Partner Violence: Not on file  ? ? ?Review of Systems:    ?Constitutional: No weight loss, fever or chills ?Cardiovascular: No chest pain   ?Respiratory: No SOB  ?Gastrointestinal: See HPI and otherwise negative ? ? Physical Exam:  ?Vital signs: ?BP 100/60   Pulse 99   Ht 4' 11.5" (1.511 m) Comment: measured without shoes  Wt 151 lb 6.4 oz (68.7 kg)   SpO2 98%   BMI 30.07 kg/m?   ? ?Constitutional:   Pleasant overweight female appears to be in NAD, Well developed, Well nourished, alert and cooperative ?Respiratory: Respirations even and unlabored. Lungs clear to auscultation bilaterally.   No wheezes, crackles, or rhonchi.  ?Cardiovascular: Normal S1, S2. No MRG. Regular rate and rhythm. No peripheral edema, cyanosis or pallor.  ?Gastrointestinal:  Soft, nondistended, mild right upper quadrant and left upper quadrant TTP no rebound or guarding. Normal bowel sounds. No appreciable masses or hepatomegaly. ?Rectal:  Not performed.  ?Psychiatric: Oriented to person, place and time. Demonstrates good judgement and reason without abnormal affect or behaviors. ? ?RELEVANT LABS AND IMAGING: ?CBC ?   ?Component Value Date/Time  ? WBC 13.1 (H) 07/04/2021 1617  ? WBC 11.0 (H) 08/10/2020 1420  ? RBC 5.01 07/04/2021 1617  ? RBC 4.48 08/10/2020 1420  ? HGB 13.4 07/04/2021 1617  ? HCT 41.2 07/04/2021 1617  ? PLT 487 (H) 07/04/2021 1617  ? MCV 82 07/04/2021 1617  ? MCH 26.7 07/04/2021 1617  ? MCH 26.8 08/10/2020 1420  ? MCHC 32.5 07/04/2021 1617  ? MCHC 31.3 08/10/2020 1420  ? RDW 13.3 07/04/2021 1617  ? LYMPHSABS 1.9 08/10/2020 1420  ? LYMPHSABS 2.3 05/11/2018 1444  ? MONOABS 0.5 08/10/2020 1420  ? EOSABS 0.3 08/10/2020 1420  ? EOSABS 0.3 05/11/2018 1444  ? BASOSABS 0.1 08/10/2020 1420  ? BASOSABS 0.1 05/11/2018 1444  ? ? ?CMP  ?   ?Component Value Date/Time  ? NA 138 07/04/2021 1617  ? K 4.5 07/04/2021 1617  ? CL 100 07/04/2021 1617  ? CO2 28 07/04/2021 1617  ? GLUCOSE 89 07/04/2021 1617  ? GLUCOSE 119 (H) 08/10/2020 1420  ? BUN 8 07/04/2021 1617  ? CREATININE 0.56 (L) 07/04/2021 1617  ? CREATININE 0.73 12/18/2015 1634  ? CALCIUM 9.6 07/04/2021 1617  ? PROT 7.1 07/04/2021 1617  ? ALBUMIN 4.7 07/04/2021 1617  ? AST 26 07/04/2021 1617  ? ALT 53 (H) 07/04/2021 1617  ? ALKPHOS 111 07/04/2021 1617  ? BILITOT <0.2 07/04/2021 1617  ? GFRNONAA >60 08/10/2020 1420  ? [facility] >89 09/20/2014 1538  ? GFRAA 144  08/03/2019 1029  ? GFRAA >89 09/20/2014 1538  ? ? ?Assessment: ?1.  Abdominal pain: In bilateral upper quadrants, feels like a "stone" rated as a constant 8/10, worse after eating or drinking or walking around, no change after cholecystectomy last year, recent labs with an elevated white count and UTI with leukocytes treated with Bactrim, no change in symptoms, no current constipation per patient, ultrasound yesterday which has not resulted yet; consider IBS versus kidney stones versus musculoskeletal versus constipation ? ?Plan: ?1.  Discussed with patient that the  ultrasound from yesterday would be very helpful to guide her care.  I do not want to start her on a bunch medication she does not need.  We will wait for results for that.  Discussed that it should be back by Monday.  At that point if this is normal then would recommend a CT of the abdomen pelvis with contrast. ?2.  Discussed possibility of constipation still playing a role, patient believes she is having fairly normal bowel movements and felt no better when she was on Amitiza ?3.  Patient to follow in clinic per recommendations after review of ultrasound. ? ?Hyacinth Meeker, PA-C ?[facility] ?07/13/2021, 11:06 AM ? ?Cc: Claiborne Rigg, NP  ?

## 2021-07-16 ENCOUNTER — Other Ambulatory Visit: Payer: Self-pay

## 2021-07-16 ENCOUNTER — Emergency Department (HOSPITAL_COMMUNITY)
Admission: EM | Admit: 2021-07-16 | Discharge: 2021-07-17 | Disposition: A | Discharge status: Home / Self Care | Payer: Medicaid Other | Attending: Emergency Medicine | Admitting: Emergency Medicine

## 2021-07-16 ENCOUNTER — Encounter (HOSPITAL_COMMUNITY): Payer: Self-pay | Admitting: Emergency Medicine

## 2021-07-16 ENCOUNTER — Telehealth: Payer: Self-pay

## 2021-07-16 ENCOUNTER — Encounter: Payer: Self-pay | Admitting: Nurse Practitioner

## 2021-07-16 DIAGNOSIS — R350 Frequency of micturition: Secondary | ICD-10-CM | POA: Diagnosis not present

## 2021-07-16 DIAGNOSIS — R1084 Generalized abdominal pain: Secondary | ICD-10-CM | POA: Diagnosis not present

## 2021-07-16 DIAGNOSIS — R1010 Upper abdominal pain, unspecified: Secondary | ICD-10-CM

## 2021-07-16 DIAGNOSIS — K5909 Other constipation: Secondary | ICD-10-CM

## 2021-07-16 DIAGNOSIS — R1031 Right lower quadrant pain: Secondary | ICD-10-CM | POA: Insufficient documentation

## 2021-07-16 DIAGNOSIS — R300 Dysuria: Secondary | ICD-10-CM | POA: Diagnosis not present

## 2021-07-16 DIAGNOSIS — R1011 Right upper quadrant pain: Secondary | ICD-10-CM | POA: Diagnosis present

## 2021-07-16 LAB — LIPASE, BLOOD: Lipase: 31 U/L (ref 11–51)

## 2021-07-16 LAB — COMPREHENSIVE METABOLIC PANEL
ALT: 41 U/L (ref 0–44)
AST: 21 U/L (ref 15–41)
Albumin: 3.9 g/dL (ref 3.5–5.0)
Alkaline Phosphatase: 83 U/L (ref 38–126)
Anion gap: 7 (ref 5–15)
BUN: 7 mg/dL (ref 6–20)
CO2: 28 mmol/L (ref 22–32)
Calcium: 9.5 mg/dL (ref 8.9–10.3)
Chloride: 103 mmol/L (ref 98–111)
Creatinine, Ser: 0.55 mg/dL (ref 0.44–1.00)
GFR, Estimated: >60 mL/min (ref >60)
Glucose, Bld: 112 mg/dL — ABNORMAL HIGH (ref 70–99)
Potassium: 3.7 mmol/L (ref 3.5–5.1)
Sodium: 138 mmol/L (ref 135–145)
Total Bilirubin: 0.4 mg/dL (ref 0.3–1.2)
Total Protein: 7 g/dL (ref 6.5–8.1)

## 2021-07-16 LAB — CBC WITH DIFFERENTIAL/PLATELET
Abs Immature Granulocytes: 0.09 10*3/uL — ABNORMAL HIGH (ref 0.00–0.07)
Basophils Absolute: 0.1 10*3/uL (ref 0.0–0.1)
Basophils Relative: 1 %
Eosinophils Absolute: 0.7 10*3/uL — ABNORMAL HIGH (ref 0.0–0.5)
Eosinophils Relative: 5 %
HCT: 42.1 % (ref 36.0–46.0)
Hemoglobin: 13.1 g/dL (ref 12.0–15.0)
Immature Granulocytes: 1 %
Lymphocytes Relative: 29 %
Lymphs Abs: 4.3 10*3/uL — ABNORMAL HIGH (ref 0.7–4.0)
MCH: 26.1 pg (ref 26.0–34.0)
MCHC: 31.1 g/dL (ref 30.0–36.0)
MCV: 83.9 fL (ref 80.0–100.0)
Monocytes Absolute: 0.8 10*3/uL (ref 0.1–1.0)
Monocytes Relative: 5 %
Neutro Abs: 8.7 10*3/uL — ABNORMAL HIGH (ref 1.7–7.7)
Neutrophils Relative %: 59 %
Platelets: 486 10*3/uL — ABNORMAL HIGH (ref 150–400)
RBC: 5.02 MIL/uL (ref 3.87–5.11)
RDW: 13.2 % (ref 11.5–15.5)
WBC: 14.6 10*3/uL — ABNORMAL HIGH (ref 4.0–10.5)
nRBC: 0 % (ref 0.0–0.2)

## 2021-07-16 LAB — URINALYSIS, ROUTINE W REFLEX MICROSCOPIC
Bacteria, UA: NONE SEEN
Bilirubin Urine: NEGATIVE
Glucose, UA: NEGATIVE mg/dL
Ketones, ur: NEGATIVE mg/dL
Leukocytes,Ua: NEGATIVE
Nitrite: NEGATIVE
Protein, ur: NEGATIVE mg/dL
Specific Gravity, Urine: 1.005 (ref 1.005–1.030)
pH: 8 (ref 5.0–8.0)

## 2021-07-16 LAB — I-STAT BETA HCG BLOOD, ED (MC, WL, AP ONLY): I-stat hCG, quantitative: 7.1 m[IU]/mL — ABNORMAL HIGH (ref <5)

## 2021-07-16 NOTE — ED Triage Notes (Signed)
Pt arrive POV from home for c/o abd pain for a month, n/v/d for a month. ?

## 2021-07-16 NOTE — ED Provider Triage Note (Signed)
Emergency Medicine Provider Triage Evaluation Note ? ?Kelsey Davies , a 31 y.o. female  was evaluated in triage.  Pt complains of abdominal pain.  Patient reports that she has been dealing with abdominal pain over the last 3 months.  Pain today is similar to previous episodes of pain that she has been having.  Patient states that pain is located to "both sides of my stomach."  Patient is followed by [facility] gastroenterology.  Reports that she had recent ultrasound imaging.  Additionally patient endorses dysuria and urinary frequency. ? ?LMP 3 days prior ? ?Review of Systems  ?Positive: Abdominal pain, dysuria, urinary frequency ?Negative: Fever, chills, abdominal distention, constipation, diarrhea, nausea, vomiting, vaginal pain, vaginal bleeding, vaginal discharge, pneumaturia ? ?Physical Exam  ?BP 113/82   Pulse (!) 105   Temp 98.4 ?F (36.9 ?C) (Oral)   Resp 16   Ht 4\' 11"  (1.499 m)   Wt 68.7 kg   SpO2 99%   BMI 30.59 kg/m?  ?Gen:   Awake, no distress   ?Resp:  Normal effort  ?MSK:   Moves extremities without difficulty  ?Other:  Abdomen soft, nondistended, nontender with no guarding or rebound tenderness. ? ?Medical Decision Making  ?Medically screening exam initiated at 8:00 PM.  Appropriate orders placed.  Chablis Losh was informed that the remainder of the evaluation will be completed by another provider, this initial triage assessment does not replace that evaluation, and the importance of remaining in the ED until their evaluation is complete. ? ?Interpreter was used to conduct this interview. ?  ?Kelsey Jabs, PA-C ?07/16/21 2002 ? ?

## 2021-07-16 NOTE — Telephone Encounter (Signed)
Called and spoke with patient via interpreter services (Interpreter ID: [phone]). We reviewed the [place] results and recommendations. Pt would like to proceed with CT scan at this time, she reports that she still has pain. I told her that based on her office note Anderson Malta did not recommend prescribing medications at this time if not necessary. I told pt that she can take Tylenol in the interim and try a heating pad. Pt knows to expect a call from radiology scheduling to set up her appt. I provided her with the phone number to radiology scheduling if she has not heard from them. Pt verbalized understanding and had no concerns at the end of the call. ? ?CT order in epic. Secure staff message sent to radiology schedulers to contact pt for an appt. ?

## 2021-07-16 NOTE — Assessment & Plan Note (Signed)
-   POCT URINALYSIS DIP (CLINITEK) ?- POCT urine pregnancy ?- CBC ?- Comprehensive metabolic panel ?- Ambulatory referral to Gastroenterology ? ?Will order bactrim  - no showed trace leuk ? ? ?Follow up: ?Follow up with PCP ?

## 2021-07-16 NOTE — Telephone Encounter (Signed)
-----   Message from Unk Lightning, [place] sent at 07/16/2021  9:27 AM EDT ----- ?Regarding: ct ?Please call patient and let her know that I reviewed recent ultrasound, this did not really show [place] much of anything.  As we discussed I think next steps would be a CT of her abdomen with contrast given her ongoing right upper quadrant/left upper quadrant pain.  Please schedule for her. ? ?Thanks, JL L ? ?FYI patient is from [place] and has a family member that helps translate.  Though she does understand some on her own. ? ?

## 2021-07-17 ENCOUNTER — Encounter (HOSPITAL_COMMUNITY): Payer: Self-pay | Admitting: Emergency Medicine

## 2021-07-17 ENCOUNTER — Emergency Department (HOSPITAL_COMMUNITY): Payer: Medicaid Other

## 2021-07-17 NOTE — ED Notes (Signed)
Patient transported to CT 

## 2021-07-17 NOTE — Discharge Instructions (Addendum)
You were seen today for abdominal pain.  Your work-up was reassuring.  Your ultrasound from last week and your CT scan from the day do not show any obvious source of your pain.  Try Tylenol or ibuprofen for discomfort.  Follow-up with your gastroenterologist and primary care physician. ?

## 2021-07-17 NOTE — ED Provider Notes (Signed)
?[facility] EMERGENCY DEPARTMENT ?Provider Note ? ? ?CSN: [id number] ?Arrival date & time: 07/16/21  1732 ? ?  ? ?History ? ?Chief Complaint  ?Patient presents with  ? Abdominal Pain  ? ? ?Kelsey Davies is a 31 y.o. female. ? ?HPI ? ?  ? ?N[place]ali interpreter used ? ?This is a 31 year old female who presents with abdominal pain.  Patient reports bilateral upper abdominal pain over the last 3 months.  She has had similar pain in the past mostly in the right upper quadrant.  She had a cholecystectomy approximately 1 year ago.  She is followed by [facility] gastroenterology.  She also recently saw her OB/GYN and had ultrasound last week.  She does not know the results of this.  She endorses urinary frequency and dysuria.  She does not believe herself to be pregnant.  She denies nausea, vomiting, diarrhea.  No fevers.  Patient reports that she is having a normal bowel movement every 2 to 3 days.  Recently finished a course of antibiotics for possible UTI. ? ?Chart reviewed.  She just saw gastroenterology on Friday.  At that time ultrasound results were not available.  She was not started on any new medications. ? ? ?Home Medications ?Prior to Admission medications   ?Not on File  ?   ? ?Allergies    ?Patient has no known allergies.   ? ?Review of Systems   ?Review of Systems  ?Constitutional:  Negative for fever.  ?Gastrointestinal:  Positive for abdominal pain. Negative for diarrhea, nausea and vomiting.  ?Genitourinary:  Positive for dysuria and frequency.  ?All other systems reviewed and are negative. ? ?Physical Exam ?Updated Vital Signs ?BP 90/70   Pulse 96   Temp 98.4 ?F (36.9 ?C) (Oral)   Resp (!) 22   Ht 1.499 m (4\' 11" )   Wt 68.7 kg   SpO2 99%   BMI 30.59 kg/m?  ?Physical Exam ?Vitals and nursing note reviewed.  ?Constitutional:   ?   Appearance: She is well-developed. She is not ill-appearing.  ?HENT:  ?   Head: Normocephalic and atraumatic.  ?Eyes:  ?   Pupils: Pupils are equal, round,  and reactive to light.  ?Cardiovascular:  ?   Rate and Rhythm: Normal rate and regular rhythm.  ?   Heart sounds: Normal heart sounds.  ?Pulmonary:  ?   Effort: Pulmonary effort is normal. No respiratory distress.  ?   Breath sounds: No wheezing.  ?Abdominal:  ?   General: Bowel sounds are normal.  ?   Palpations: Abdomen is soft.  ?   Tenderness: There is no abdominal tenderness. There is no right CVA tenderness, left CVA tenderness, guarding or rebound.  ?Musculoskeletal:  ?   Cervical back: Neck supple.  ?Skin: ?   General: Skin is warm and dry.  ?Neurological:  ?   Mental Status: She is alert and oriented to person, place, and time.  ?Psychiatric:     ?   Mood and Affect: Mood normal.  ? ? ?ED Results / Procedures / Treatments   ?Labs ?(all labs ordered are listed, but only abnormal results are displayed) ?Labs Reviewed  ?COMPREHENSIVE METABOLIC PANEL - Abnormal; Notable for the following components:  ?    Result Value  ? Glucose, Bld 112 (*)   ? All other components within normal limits  ?CBC WITH DIFFERENTIAL/PLATELET - Abnormal; Notable for the following components:  ? WBC 14.6 (*)   ? Platelets 486 (*)   ? Neutro Abs  8.7 (*)   ? Lymphs Abs 4.3 (*)   ? Eosinophils Absolute 0.7 (*)   ? Abs Immature Granulocytes 0.09 (*)   ? All other components within normal limits  ?URINALYSIS, ROUTINE W REFLEX MICROSCOPIC - Abnormal; Notable for the following components:  ? Color, Urine STRAW (*)   ? Hgb urine dipstick MODERATE (*)   ? All other components within normal limits  ?I-STAT BETA HCG BLOOD, ED ([facility], [facility], [facility] ONLY) - Abnormal; Notable for the following components:  ? I-stat hCG, quantitative 7.1 (*)   ? All other components within normal limits  ?URINE CULTURE  ?LIPASE, BLOOD  ? ? ?EKG ?None ? ?Radiology ?CT Renal Stone Study ? ?Result Date: 07/17/2021 ?CLINICAL DATA:  Abdominal pain for about a month.  Flank pain. EXAM: CT ABDOMEN AND PELVIS WITHOUT CONTRAST TECHNIQUE: Multidetector CT imaging of the abdomen and  pelvis was performed following the standard protocol without IV contrast. RADIATION DOSE REDUCTION: This exam was performed according to the departmental dose-optimization program which includes automated exposure control, adjustment of the mA and/or kV according to patient size and/or use of iterative reconstruction technique. COMPARISON:  None. FINDINGS: Lower chest: Unremarkable. Hepatobiliary: No suspicious focal abnormality within the liver parenchyma. Gallbladder is surgically absent. No intrahepatic or extrahepatic biliary dilation. Pancreas: No focal mass lesion. No dilatation of the main duct. No intraparenchymal cyst. No peripancreatic edema. Spleen: No splenomegaly. No focal mass lesion. Adrenals/Urinary Tract: No adrenal nodule or mass. Kidneys unremarkable. No evidence for hydroureter. The urinary bladder appears normal for the degree of distention. Stomach/Bowel: Stomach is unremarkable. No gastric wall thickening. No evidence of outlet obstruction. Duodenum is normally positioned as is the ligament of Treitz. No small bowel wall thickening. No small bowel dilatation. The terminal ileum is normal. The appendix is normal. No gross colonic mass. No colonic wall thickening. Vascular/Lymphatic: No abdominal aortic aneurysm. No abdominal aortic atherosclerotic calcification. There is no gastrohepatic or hepatoduodenal ligament lymphadenopathy. No retroperitoneal or mesenteric lymphadenopathy. No pelvic sidewall lymphadenopathy. Reproductive: The uterus is unremarkable.  There is no adnexal mass. Other: No intraperitoneal free fluid. Musculoskeletal: No worrisome lytic or sclerotic osseous abnormality. IMPRESSION: 1. No acute findings in the abdomen or pelvis. Specifically, no evidence of urinary stone disease. No secondary changes in either kidney or ureter. 2. Status post cholecystectomy. Electronically Signed   By: Kennith CenterEric  Mansell M.D.   On: 07/17/2021 07:05   ? ?Procedures ?Procedures  ? ? ?Medications  Ordered in ED ?Medications - No data to display ? ?ED Course/ Medical Decision Making/ A&P ?  ?                        ?Medical Decision Making ?Amount and/or Complexity of Data Reviewed ?Radiology: ordered. ? ? ?This patient presents to the ED for concern of abdominal pain, this involves an extensive number of treatment options, and is a complaint that carries with it a high risk of complications and morbidity.  The differential diagnosis includes musculoskeletal pain, kidney stone, constipation, less likely appendicitis or gastritis ? ?MDM:   ? ?This is a 31 year old female who presents with abdominal pain.  It is acute on chronic.  It is bilateral.  It is fairly atypical.  No signs of peritonitis.  Labs reviewed.  No significant leukocytosis or metabolic derangements.  Urinalysis is without evidence of a UTI.  I reviewed her chart.  Ultrasound from last week showed no significant abnormalities.  She is status postcholecystectomy.  Fatty liver  noted with trace ascites.  Given that kidney stone is on the differential, will obtain CT scan. ?(Labs, imaging) ? ?Labs: ?I Ordered, and personally interpreted labs.  The pertinent results include: Reassuring ? ?Imaging Studies ordered: ?I ordered imaging studies including CT scan negative for kidney stone or other pathology ?I independently visualized and interpreted imaging. ?I agree with the radiologist interpretation ? ?Additional history obtained from chart review.  External records from outside source obtained and reviewed including patient visit ? ?Critical Interventions: ?N/A ? ?Consultations: ?I requested consultation with the NA,  and discussed lab and imaging findings as well as pertinent plan - they recommend: N/A ? ?Cardiac Monitoring: ?The patient was maintained on a cardiac monitor.  I personally viewed and interpreted the cardiac monitored which showed an underlying rhythm of: Normal sinus rhythm ? ?Reevaluation: ?After the interventions noted above, I  reevaluated the patient and found that they have :stayed the same ? ? ?Considered admission for: N/A ? ?Social Determinants of Health: ?Language barrier ? ?Disposition: Discharge ? ?Co morbidities that complicate the patie

## 2021-07-17 NOTE — ED Notes (Signed)
Pt verbalized understanding of DC instructions. Interpreter used to discuss DC paperwork. No questions at this time. Pt ambulatory to the waiting room.  ?

## 2021-07-18 LAB — URINE CULTURE

## 2021-07-19 ENCOUNTER — Other Ambulatory Visit: Payer: Self-pay

## 2021-07-19 NOTE — Progress Notes (Signed)
Reviewed and agree with documentation and assessment and plan. K. Veena Darrin Koman , MD   

## 2021-07-20 ENCOUNTER — Encounter (HOSPITAL_BASED_OUTPATIENT_CLINIC_OR_DEPARTMENT_OTHER): Payer: Self-pay

## 2021-07-20 ENCOUNTER — Telehealth: Payer: Self-pay | Admitting: Gastroenterology

## 2021-07-20 ENCOUNTER — Ambulatory Visit (HOSPITAL_BASED_OUTPATIENT_CLINIC_OR_DEPARTMENT_OTHER)
Admission: RE | Admit: 2021-07-20 | Discharge: 2021-07-20 | Disposition: A | Discharge status: Home / Self Care | Payer: Medicaid Other | Source: Ambulatory Visit | Attending: Physician Assistant | Admitting: Physician Assistant

## 2021-07-20 DIAGNOSIS — R1010 Upper abdominal pain, unspecified: Secondary | ICD-10-CM

## 2021-07-20 DIAGNOSIS — K5909 Other constipation: Secondary | ICD-10-CM

## 2021-07-20 NOTE — Telephone Encounter (Signed)
Received call from radiology.  Patient had scheduled CT abdomen/pelvis with IV contrast.  4 days prior she had a positive beta hCG when she was in the hospital.  She had a noncontrasted CT abdomen/pelvis done on that day as well.  This was unremarkable.  The CT abdomen/pelvis with IV and oral contrast was ordered by PA Fabio Asa and MD Nandigam. ?I told the radiology group to cancel the CT scan for now.  Patient needs to follow-up with primary care or with OB in regards to follow-up of positive pregnancy test. ?We will allow her primary GI team to decide what next imaging may be required or necessary. ?I will forward this information to them and they can decide next week how they want to approach this. ?I did not discuss this with the patient but rather with radiology who was going to relay this information to the patient. ? ?Justice Britain, MD ?[facility] ?Advanced Endoscopy ?Office # [phone] ? ?

## 2021-07-23 ENCOUNTER — Other Ambulatory Visit: Payer: Self-pay | Admitting: Nurse Practitioner

## 2021-07-23 ENCOUNTER — Ambulatory Visit: Payer: Self-pay

## 2021-07-23 DIAGNOSIS — R7989 Other specified abnormal findings of blood chemistry: Secondary | ICD-10-CM

## 2021-07-23 DIAGNOSIS — E349 Endocrine disorder, unspecified: Secondary | ICD-10-CM

## 2021-07-23 DIAGNOSIS — K5909 Other constipation: Secondary | ICD-10-CM

## 2021-07-23 MED ORDER — DOCUSATE SODIUM 50 MG PO CAPS: 50.0000 mg | ORAL_CAPSULE | Freq: Two times a day (BID) | ORAL | 0 refills | Status: DC

## 2021-07-23 NOTE — Telephone Encounter (Signed)
Agree with canceling CT abdomen pelvis with IV contrast.  Reviewed recent noncontrast CT done in the ER that was unremarkable, negative for any acute pathology.  Repeat imaging will likely be very low yield and increase risk for radiation related side effects during pregnancy.  Patient should follow-up with OB and PCP.  Return to GI as needed ?

## 2021-07-23 NOTE — Telephone Encounter (Signed)
Gynecology referral placed. Colace sent to pharmacy for constipation. If abd pain 10/10 needs to go to hospital ?

## 2021-07-23 NOTE — Telephone Encounter (Signed)
? ? ? ?  Chief Complaint: Pt. Unable to complete CT scan due to pt. Is pregnant. Still having pain that is 10/10. ?Symptoms: Pain, constipation ?Frequency:  ?Pertinent Negatives: Patient denies  ?Disposition: [] ED /[] Urgent Care (no appt availability in office) / [] Appointment(In office/virtual)/ []  [facility]/ [] Home Care/ [] Refused Recommended Disposition /[] [place] [facility]/ []  Follow-up with PCP ?Additional Notes: Pt. Just found out she is pregnant. Still having pain. Does not have OB/GYN. Please advise.  ?Answer Assessment - Initial Assessment Questions ?1. LOCATION: "Where does it hurt?"  ?    Lower abdomen ?2. RADIATION: "Does the pain shoot anywhere else?" (e.g., chest, back) ?    Back ?3. ONSET: "When did the pain begin?" (e.g., minutes, hours or days ago)  ?    3 months ?4. SUDDEN: "Gradual or sudden onset?" ?    Gradual ?5. PATTERN "Does the pain come and go, or is it constant?" ?   - If constant: "Is it getting better, staying the same, or worsening?"  ?    (Note: Constant means the pain never goes away completely; most serious pain is constant and it progresses)  ?   - If intermittent: "How long does it last?" "Do you have pain now?" ?    (Note: Intermittent means the pain goes away completely between bouts) ?    Constant ?6. SEVERITY: "How bad is the pain?"  (e.g., Scale 1-10; mild, moderate, or severe) ?  - MILD (1-3): doesn't interfere with normal activities, abdomen soft and not tender to touch  ?  - MODERATE (4-7): interferes with normal activities or awakens from sleep, abdomen tender to touch  ?  - SEVERE (8-10): excruciating pain, doubled over, unable to do any normal activities  ?    Now - 10 ?7. RECURRENT SYMPTOM: "Have you ever had this type of stomach pain before?" If Yes, ask: "When was the last time?" and "What happened that time?"  ?    Yes -  ?8. CAUSE: "What do you think is causing the stomach pain?" ?    Unsure ?9. RELIEVING/AGGRAVATING FACTORS: "What makes it better  or worse?" (e.g., movement, antacids, bowel movement) ?    No ?10. OTHER SYMPTOMS: "Do you have any other symptoms?" (e.g., back pain, diarrhea, fever, urination pain, vomiting) ?      Constipation - last BM Saturday ?11. PREGNANCY: "Is there any chance you are pregnant?" "When was your last menstrual period?" ?      Yes ? ?Protocols used: Abdominal Pain - Female-A-AH ? ?

## 2021-07-24 ENCOUNTER — Ambulatory Visit: Payer: Medicaid Other | Attending: Nurse Practitioner

## 2021-07-24 ENCOUNTER — Other Ambulatory Visit: Payer: Self-pay | Admitting: Nurse Practitioner

## 2021-07-24 DIAGNOSIS — E349 Endocrine disorder, unspecified: Secondary | ICD-10-CM

## 2021-07-24 NOTE — Telephone Encounter (Signed)
Called pt made aware of NP message and instructions.  ?

## 2021-07-25 LAB — HUMAN CHORIONIC GONADOTROPIN(HCG),B-SUBUNIT,QUANTITATIVE): HCG, Beta Chain, Quant, S: <1 m[IU]/mL

## 2021-07-26 ENCOUNTER — Ambulatory Visit (HOSPITAL_BASED_OUTPATIENT_CLINIC_OR_DEPARTMENT_OTHER): Admission: RE | Admit: 2021-07-26 | Payer: Medicaid Other | Source: Ambulatory Visit

## 2021-07-26 ENCOUNTER — Other Ambulatory Visit: Payer: Self-pay | Admitting: Physician Assistant

## 2021-07-26 ENCOUNTER — Ambulatory Visit (HOSPITAL_BASED_OUTPATIENT_CLINIC_OR_DEPARTMENT_OTHER)
Admission: RE | Admit: 2021-07-26 | Discharge: 2021-07-26 | Disposition: A | Discharge status: Home / Self Care | Payer: Medicaid Other | Source: Ambulatory Visit | Attending: Physician Assistant | Admitting: Physician Assistant

## 2021-07-26 ENCOUNTER — Telehealth: Payer: Self-pay

## 2021-07-26 DIAGNOSIS — K5909 Other constipation: Secondary | ICD-10-CM

## 2021-07-26 DIAGNOSIS — R1010 Upper abdominal pain, unspecified: Secondary | ICD-10-CM | POA: Insufficient documentation

## 2021-07-26 MED ORDER — IOHEXOL 300 MG/ML  SOLN
100.0000 mL | Freq: Once | INTRAMUSCULAR | Status: AC | PRN
  Administered 2021-07-26: 100 mL via INTRAVENOUS

## 2021-07-26 NOTE — Telephone Encounter (Signed)
Kelsey Davies from radiology called.  CT abdomen ordered by Hyacinth Meeker, PA was cancelled due to a positive pregnancy test.  HCG was repeated and was negative per  ?hCG for pregnancy is negative.  There is no indication that you are pregnant at this time.  Please cancel gynecological appointment if you have made it already  ?Written by Claiborne Rigg, NP on 07/25/2021  5:14 PM EDT ? ?Kelsey Davies called to request that they proceed with CT.  Kelsey Davies advised to proceed.  ?

## 2021-08-06 ENCOUNTER — Other Ambulatory Visit: Payer: Self-pay

## 2021-08-06 MED ORDER — DICYCLOMINE HCL 10 MG PO CAPS: ORAL_CAPSULE | ORAL | 0 refills | Status: DC

## 2021-08-29 ENCOUNTER — Encounter: Payer: Self-pay | Admitting: Nurse Practitioner

## 2021-08-29 ENCOUNTER — Ambulatory Visit: Payer: Medicaid Other | Attending: Nurse Practitioner | Admitting: Nurse Practitioner

## 2021-08-29 VITALS — BP 105/75 | HR 93 | Wt 152.8 lb

## 2021-08-29 DIAGNOSIS — G8929 Other chronic pain: Secondary | ICD-10-CM

## 2021-08-29 DIAGNOSIS — M25562 Pain in left knee: Secondary | ICD-10-CM | POA: Diagnosis not present

## 2021-08-29 DIAGNOSIS — F17210 Nicotine dependence, cigarettes, uncomplicated: Secondary | ICD-10-CM

## 2021-08-29 MED ORDER — NICOTINE 21 MG/24HR TD PT24: 21.0000 mg | MEDICATED_PATCH | Freq: Every day | TRANSDERMAL | 0 refills | Status: AC

## 2021-08-29 MED ORDER — MELOXICAM 7.5 MG PO TABS: 7.5000 mg | ORAL_TABLET | Freq: Every day | ORAL | 0 refills | Status: DC

## 2021-08-29 NOTE — Progress Notes (Signed)
? ?Assessment & Plan:  ?[name] was seen today for knee pain. ? ?Diagnoses and all orders for this visit: ? ?Chronic pain of left knee ?Recommend knee sleeve during the day ?-     DG Knee Complete 4 Views Left; Future ?-     meloxicam (MOBIC) 7.5 MG tablet; Take 1 tablet (7.5 mg total) by mouth daily. For knee pain. Take with food ?May also alternate with tylenol ? ?Cigarette smoker ?-     nicotine (NICODERM CQ - DOSED IN MG/24 HOURS) 21 mg/24hr patch; Place 1 patch (21 mg total) onto the skin daily. ? ? ? ?Patient has been counseled on age-appropriate routine health concerns for screening and prevention. These are reviewed and up-to-date. Referrals have been placed accordingly. Immunizations are up-to-date or declined.    ?Subjective:  ? ?Chief Complaint  ?Patient presents with  ? Knee Pain  ? ?HPI ?Kelsey Davies 31 y.o. female presents to office today with complaints of left knee pain.  ? ?VRI was used to communicate directly with patient for the entire encounter including providing detailed patient instructions.   ? ? ?Left Knee Pain ?She endorses chronic left knee pain with worsening pain over the past several months. Pain is unrelated to any injury or trauma. Aggravating factors: prolonged walking, bending. Associated factors: Decreased knee stability.  Pain is constant and somewhat relieved with tylenol and massages to the knee. There is no associated swelling or warmth.   ? ?Tobacco Abuse ?She uses chewing tobacco 5-6  times per day. Interested in quitting and requesting nicotine patches.  ? ?She was referred to GI for chronic upper abdominal pain and constipation. States bentyl is ineffective and she is no longer taking the stool softener. She has an upcoming appt with GI on 09-12-2021.  ? ? ?Review of Systems  ?Constitutional:  Negative for fever, malaise/fatigue and weight loss.  ?HENT: Negative.  Negative for nosebleeds.   ?Eyes: Negative.  Negative for blurred vision, double vision and photophobia.   ?Respiratory: Negative.  Negative for cough and shortness of breath.   ?Cardiovascular: Negative.  Negative for chest pain, palpitations and leg swelling.  ?Gastrointestinal:  Positive for abdominal pain. Negative for heartburn, nausea and vomiting.  ?Genitourinary: Negative.   ?Musculoskeletal:  Positive for joint pain. Negative for myalgias.  ?Neurological: Negative.  Negative for dizziness, focal weakness, seizures and headaches.  ?Psychiatric/Behavioral: Negative.  Negative for suicidal ideas.   ? ?Past Medical History:  ?Diagnosis Date  ? Medical history non-contributory   ? ? ?Past Surgical History:  ?Procedure Laterality Date  ? NO PAST SURGERIES    ? ? ?Family History  ?Problem Relation Age of Onset  ? Healthy Mother   ? Rheum arthritis Maternal Grandmother   ? Rheum arthritis Maternal Grandfather   ? Asthma Paternal Grandmother   ? Rheum arthritis Sister   ? Rheum arthritis Brother   ? Hearing loss Neg Hx   ? Colon cancer Neg Hx   ? Stomach cancer Neg Hx   ? Pancreatic cancer Neg Hx   ? ? ?Social History Reviewed with no changes to be made today.  ? ?Outpatient Medications Prior to Visit  ?Medication Sig Dispense Refill  ? dicyclomine (BENTYL) 10 MG capsule Take 1 tablet (10 mg total) by mouth 4 times daily, 20 to 30 minutes before meals and at bedtime (Patient not taking: Reported on 08/29/2021) 120 capsule 0  ? docusate sodium (COLACE) 50 MG capsule Take 1 capsule (50 mg total) by mouth 2 (two) times  daily. (Patient not taking: Reported on 08/29/2021) 60 capsule 0  ? ?No facility-administered medications prior to visit.  ? ? ?No Known Allergies ? ?   ?Objective:  ?  ?BP 105/75   Pulse 93   Wt 152 lb 12.8 oz (69.3 kg)   SpO2 98%   BMI 30.86 kg/m?  ?Wt Readings from Last 3 Encounters:  ?08/29/21 152 lb 12.8 oz (69.3 kg)  ?07/16/21 151 lb 7.3 oz (68.7 kg)  ?07/13/21 151 lb 6.4 oz (68.7 kg)  ? ? ?Physical Exam ?Vitals and nursing note reviewed.  ?Constitutional:   ?   Appearance: She is well-developed.   ?HENT:  ?   Head: Normocephalic and atraumatic.  ?Cardiovascular:  ?   Rate and Rhythm: Normal rate and regular rhythm.  ?   Heart sounds: Normal heart sounds. No murmur heard. ?  No friction rub. No gallop.  ?Pulmonary:  ?   Effort: Pulmonary effort is normal. No tachypnea or respiratory distress.  ?   Breath sounds: Normal breath sounds. No decreased breath sounds, wheezing, rhonchi or rales.  ?Chest:  ?   Chest wall: No tenderness.  ?Abdominal:  ?   General: Bowel sounds are normal.  ?   Palpations: Abdomen is soft.  ?Musculoskeletal:     ?   General: Normal range of motion.  ?   Cervical back: Normal range of motion.  ?   Right knee: Normal.  ?   Left knee: No swelling, deformity or erythema. Normal range of motion. Tenderness present over the patellar tendon. Normal patellar mobility.  ?Skin: ?   General: Skin is warm and dry.  ?Neurological:  ?   Mental Status: She is alert and oriented to person, place, and time.  ?   Coordination: Coordination normal.  ?Psychiatric:     ?   Behavior: Behavior normal. Behavior is cooperative.     ?   Thought Content: Thought content normal.     ?   Judgment: Judgment normal.  ? ? ? ? ?   ?Patient has been counseled extensively about nutrition and exercise as well as the importance of adherence with medications and regular follow-up. The patient was given clear instructions to go to ER or return to medical center if symptoms don't improve, worsen or new problems develop. The patient verbalized understanding.  ? ?Follow-up: Return for PAP Smear in august.  ? ?Claiborne Rigg, FNP-BC ?[facility] ?[place], [place] ?[phone]   ?08/29/2021, 9:44 PM ?

## 2021-08-30 ENCOUNTER — Ambulatory Visit
Admission: RE | Admit: 2021-08-30 | Discharge: 2021-08-30 | Disposition: A | Discharge status: Home / Self Care | Payer: Medicaid Other | Source: Ambulatory Visit | Attending: Nurse Practitioner | Admitting: Nurse Practitioner

## 2021-08-30 DIAGNOSIS — G8929 Other chronic pain: Secondary | ICD-10-CM

## 2021-09-12 ENCOUNTER — Ambulatory Visit: Payer: Medicaid Other | Admitting: Physician Assistant

## 2021-09-18 ENCOUNTER — Ambulatory Visit: Payer: Medicaid Other | Attending: Nurse Practitioner | Admitting: Nurse Practitioner

## 2021-09-18 ENCOUNTER — Encounter: Payer: Self-pay | Admitting: Nurse Practitioner

## 2021-09-18 DIAGNOSIS — N393 Stress incontinence (female) (male): Secondary | ICD-10-CM

## 2021-09-18 MED ORDER — MISC. DEVICES MISC
0 refills | Status: AC
Start: 2021-09-18 — End: ?

## 2021-09-18 NOTE — Progress Notes (Signed)
Virtual Visit via Telephone Note  I discussed the limitations, risks, security and privacy concerns of performing an evaluation and management service by telephone and the availability of in person appointments. I also discussed with the patient that there may be a patient responsible charge related to this service. The patient expressed understanding and agreed to proceed.    I connected with Kelsey Davies on 09/18/21  at   2:10 PM EDT  EDT by telephone and verified that I am speaking with the correct person using two identifiers.  Location of Patient: Private Residence   Location of Provider: [facility]    Persons participating in Telemedicine visit: Bertram Denver FNP-BC Kelsey Davies    History of Present Illness: Telemedicine visit for: Stress incontinence  Patient complains of urinary incontinence. This has been present for several months. She leaks urine with bending, coughing, walking, sneezing, exercise, with urge, during the night. Patient describes the symptoms as  urge to urinate with little or no warning, urine leakage with coughing/heavy physical activity, and urine leaking unpredictably.  Factors associated with symptoms include none known. Evaluation to date includes UA/CS: results pending.Treatment to date includes none.  She is currently using an incontinence pad during the day and sometimes at night as well. Denies any acute worsening flank or back pain. She does have chronic abdominal and low back pain.    Past Medical History:  Diagnosis Date   Medical history non-contributory     Past Surgical History:  Procedure Laterality Date   NO PAST SURGERIES      Family History  Problem Relation Age of Onset   Healthy Mother    Rheum arthritis Maternal Grandmother    Rheum arthritis Maternal Grandfather    Asthma Paternal Grandmother    Rheum arthritis Sister    Rheum arthritis Brother    Hearing loss Neg Hx    Colon cancer Neg Hx     Stomach cancer Neg Hx    Pancreatic cancer Neg Hx     Social History   Socioeconomic History   Marital status: Married    Spouse name: Not on file   Number of children: Not on file   Years of education: Not on file   Highest education level: Not on file  Occupational History   Not on file  Tobacco Use   Smoking status: Never   Smokeless tobacco: Never  Vaping Use   Vaping Use: Never used  Substance and Sexual Activity   Alcohol use: No   Drug use: No   Sexual activity: Yes    Birth control/protection: None  Other Topics Concern   Not on file  Social History Narrative   Not on file   Social Determinants of Health   Financial Resource Strain: Not on file  Food Insecurity: Not on file  Transportation Needs: Not on file  Physical Activity: Not on file  Stress: Not on file  Social Connections: Not on file     Observations/Objective: Awake, alert and oriented x 3   Review of Systems  Constitutional:  Negative for fever, malaise/fatigue and weight loss.  HENT: Negative.  Negative for nosebleeds.   Eyes: Negative.  Negative for blurred vision, double vision and photophobia.  Respiratory: Negative.  Negative for cough and shortness of breath.   Cardiovascular: Negative.  Negative for chest pain, palpitations and leg swelling.  Gastrointestinal: Negative.  Negative for heartburn, nausea and vomiting.  Genitourinary:  Negative for dysuria, flank pain, frequency, hematuria and urgency.  SEE HPI  Musculoskeletal: Negative.  Negative for myalgias.  Neurological: Negative.  Negative for dizziness, focal weakness, seizures and headaches.  Psychiatric/Behavioral: Negative.  Negative for suicidal ideas.    Assessment and Plan: Diagnoses and all orders for this visit:  Stress incontinence -     CULTURE, URINE COMPREHENSIVE -     Urinalysis, Complete -     Misc. Devices MISC; Please supply with size M adult diapers, gloves and wipes. ICD 10 N39.3     Follow Up  Instructions No follow-ups on file.     I discussed the assessment and treatment plan with the patient. The patient was provided an opportunity to ask questions and all were answered. The patient agreed with the plan and demonstrated an understanding of the instructions.   The patient was advised to call back or seek an in-person evaluation if the symptoms worsen or if the condition fails to improve as anticipated.  I provided 11 minutes of non-face-to-face time during this encounter including median intraservice time, reviewing previous notes, labs, imaging, medications and explaining diagnosis and management.  Claiborne Rigg, FNP-BC

## 2021-10-02 ENCOUNTER — Ambulatory Visit: Payer: Medicaid Other | Attending: Nurse Practitioner

## 2021-10-03 ENCOUNTER — Ambulatory Visit: Payer: Medicaid Other | Admitting: Nurse Practitioner

## 2021-10-03 LAB — MICROSCOPIC EXAMINATION
Bacteria, UA: NONE SEEN
Casts: NONE SEEN /lpf
RBC, Urine: NONE SEEN /hpf (ref 0–2)
WBC, UA: NONE SEEN /hpf (ref 0–5)

## 2021-10-03 LAB — URINALYSIS, COMPLETE
Bilirubin, UA: NEGATIVE
Glucose, UA: NEGATIVE
Ketones, UA: NEGATIVE
Nitrite, UA: NEGATIVE
Protein,UA: NEGATIVE
RBC, UA: NEGATIVE
Specific Gravity, UA: 1.008 (ref 1.005–1.030)
Urobilinogen, Ur: 0.2 mg/dL (ref 0.2–1.0)
pH, UA: 7.5 (ref 5.0–7.5)

## 2021-10-06 LAB — CULTURE, URINE COMPREHENSIVE

## 2021-10-19 ENCOUNTER — Encounter: Payer: Self-pay | Admitting: Physician Assistant

## 2021-10-19 ENCOUNTER — Ambulatory Visit (INDEPENDENT_AMBULATORY_CARE_PROVIDER_SITE_OTHER): Payer: Medicaid Other | Admitting: Physician Assistant

## 2021-10-19 VITALS — BP 98/64 | HR 104 | Ht 59.5 in | Wt 148.5 lb

## 2021-10-19 DIAGNOSIS — G8929 Other chronic pain: Secondary | ICD-10-CM | POA: Diagnosis not present

## 2021-10-19 DIAGNOSIS — R109 Unspecified abdominal pain: Secondary | ICD-10-CM

## 2021-10-19 NOTE — Progress Notes (Signed)
Chief Complaint: Follow-up abdominal pain  HPI:    Mrs. Kelsey Davies is a 31 year old female from [place], known to Dr. Lavon Paganini, who returns to clinic today for follow-up of abdominal pain.  03/31/2020 office visit with Dr. Lavon Paganini for right upper quadrant abdominal pain and chronic constipation.  At that time patient was switched to Amitiza 24 mcg twice daily instead of Linzess which caused abdominal cramping.  Discussed epigastric and right upper quadrant pain and thought this could be secondary to symptomatic gallstones given she had a 1.5 cm gallstone ultrasound in 2015.  She was referred to [facility].    07/04/2021 patient seen by PCP and at that time described gallbladder surgery a year ago and right-sided abdominal pain for the past 3 months with constipation.  Labs at that time ordered CBC and CMP as well as urinalysis.  She was given Bactrim for trace leukocytes.  CMP with minimally elevated ALT at 53 and CBC with elevated white count at 13.1    07/12/2021 patient had abdominal ultrasound.  This showed status postcholecystectomy, fatty liver and trace ascites.    07/13/2021 patient seen in clinic and continued to complain of right-sided abdominal pain which is unchanged even after cholecystectomy.  Constant and rated as an 8/10.  Had been on Amitiza which helped with her constipation but continued with abdominal pain.  At that point we are waiting on results from ultrasound as above to consider CT.    07/17/2021 CT renal stone study showed no acute findings.  Status postcholecystectomy.    07/27/2021 CT of the abdomen pelvis with contrast with no acute abnormality and mild hepatic steatosis.  At that time we trialed an antispasmodic, she was given Dicyclomine 10 mg 4 times daily.    Today, the patient tells me that after using the Dicyclomine 10 mg 4 times daily for a couple of weeks her abdominal pain went away and now she just uses it as needed.  She has no problems currently, no constipation and no other  GI issues.    Denies fever, chills, weight loss or change in bowel habits.  Past Medical History:  Diagnosis Date   Medical history non-contributory     Past Surgical History:  Procedure Laterality Date   NO PAST SURGERIES      Current Outpatient Medications  Medication Sig Dispense Refill   meloxicam (MOBIC) 7.5 MG tablet Take 1 tablet (7.5 mg total) by mouth daily. For knee pain. Take with food 30 tablet 0   Misc. Devices MISC Please supply with size M adult diapers, gloves and wipes. ICD 10 N39.3 1 each 0   No current facility-administered medications for this visit.    Allergies as of 10/19/2021   (No Known Allergies)    Family History  Problem Relation Age of Onset   Healthy Mother    Rheum arthritis Maternal Grandmother    Rheum arthritis Maternal Grandfather    Asthma Paternal Grandmother    Rheum arthritis Sister    Rheum arthritis Brother    Hearing loss Neg Hx    Colon cancer Neg Hx    Stomach cancer Neg Hx    Pancreatic cancer Neg Hx     Social History   Socioeconomic History   Marital status: Married    Spouse name: Not on file   Number of children: Not on file   Years of education: Not on file   Highest education level: Not on file  Occupational History   Not on file  Tobacco Use   Smoking status: Never   Smokeless tobacco: Never  Vaping Use   Vaping Use: Never used  Substance and Sexual Activity   Alcohol use: No   Drug use: No   Sexual activity: Yes    Birth control/protection: None  Other Topics Concern   Not on file  Social History Narrative   Not on file   Social Determinants of Health   Financial Resource Strain: Not on file  Food Insecurity: No Food Insecurity (06/08/2018)   Hunger Vital Sign    Worried About Running Out of Food in the Last Year: Never true    Ran Out of Food in the Last Year: Never true  Transportation Needs: No Transportation Needs (06/08/2018)   PRAPARE - Administrator, Civil Service (Medical):  No    Lack of Transportation (Non-Medical): No  Physical Activity: Not on file  Stress: Not on file  Social Connections: Not on file  Intimate Partner Violence: Not on file    Review of Systems:    Constitutional: No weight loss, fever or chills Cardiovascular: No chest pain  Respiratory: No SOB  Gastrointestinal: See HPI and otherwise negative   Physical Exam:  Vital signs: BP 98/64 (BP Location: Left Arm, Patient Position: Sitting, Cuff Size: Normal)   Pulse (!) 104   Ht 4' 11.5" (1.511 m)   Wt 148 lb 8 oz (67.4 kg)   LMP 10/15/2021   BMI 29.49 kg/m    Constitutional:   Pleasant  female appears to be in NAD, Well developed, Well nourished, alert and cooperative Respiratory: Respirations even and unlabored. Lungs clear to auscultation bilaterally.   No wheezes, crackles, or rhonchi.  Cardiovascular: Normal S1, S2. No MRG. Regular rate and rhythm. No peripheral edema, cyanosis or pallor.  Gastrointestinal:  Soft, nondistended, nontender. No rebound or guarding. Normal bowel sounds. No appreciable masses or hepatomegaly. Psychiatric: Oriented to person, place and time. Demonstrates good judgement and reason without abnormal affect or behaviors.  RELEVANT LABS AND IMAGING: CBC    Component Value Date/Time   WBC 14.6 (H) 07/16/2021 2036   RBC 5.02 07/16/2021 2036   HGB 13.1 07/16/2021 2036   HGB 13.4 07/04/2021 1617   HCT 42.1 07/16/2021 2036   HCT 41.2 07/04/2021 1617   PLT 486 (H) 07/16/2021 2036   PLT 487 (H) 07/04/2021 1617   MCV 83.9 07/16/2021 2036   MCV 82 07/04/2021 1617   MCH 26.1 07/16/2021 2036   MCHC 31.1 07/16/2021 2036   RDW 13.2 07/16/2021 2036   RDW 13.3 07/04/2021 1617   LYMPHSABS 4.3 (H) 07/16/2021 2036   LYMPHSABS 2.3 05/11/2018 1444   MONOABS 0.8 07/16/2021 2036   EOSABS 0.7 (H) 07/16/2021 2036   EOSABS 0.3 05/11/2018 1444   BASOSABS 0.1 07/16/2021 2036   BASOSABS 0.1 05/11/2018 1444    CMP     Component Value Date/Time   NA 138 07/16/2021  2036   NA 138 07/04/2021 1617   K 3.7 07/16/2021 2036   CL 103 07/16/2021 2036   CO2 28 07/16/2021 2036   GLUCOSE 112 (H) 07/16/2021 2036   BUN 7 07/16/2021 2036   BUN 8 07/04/2021 1617   CREATININE 0.55 07/16/2021 2036   CREATININE 0.73 12/18/2015 1634   CALCIUM 9.5 07/16/2021 2036   PROT 7.0 07/16/2021 2036   PROT 7.1 07/04/2021 1617   ALBUMIN 3.9 07/16/2021 2036   ALBUMIN 4.7 07/04/2021 1617   AST 21 07/16/2021 2036   ALT 41 07/16/2021 2036  ALKPHOS 83 07/16/2021 2036   BILITOT 0.4 07/16/2021 2036   BILITOT <0.2 07/04/2021 1617   GFRNONAA >60 07/16/2021 2036   GFRNONAA >89 09/20/2014 1538   GFRAA 144 08/03/2019 1029   GFRAA >89 09/20/2014 1538    Assessment: 1.  Chronic right-sided abdominal pain: CT imaging negative, patient is better after Dicyclomine; likely IBS  Plan: 1.  Continue Dicyclomine 10 mg as needed as this is working well for her.  She does not need a refill today. 2.  Patient to follow in clinic with [place] as needed.  Hyacinth Meeker, PA-C [facility] 10/19/2021, 2:06 PM  Cc: Claiborne Rigg, NP

## 2021-10-19 NOTE — Patient Instructions (Signed)
If you are age 29 or older, your body mass index should be between 23-30. Your Body mass index is 29.49 kg/m. If this is out of the aforementioned range listed, please consider follow up with your Primary Care Provider.  If you are age 27 or younger, your body mass index should be between 19-25. Your Body mass index is 29.49 kg/m. If this is out of the aformentioned range listed, please consider follow up with your Primary Care Provider.   ________________________________________________________  The [facility] providers would like to encourage you to use [facility] to communicate with providers for non-urgent requests or questions.  Due to long hold times on the telephone, sending your provider a message by [facility] may be a faster and more efficient way to get a response.  Please allow 48 business hours for a response.  Please remember that this is for non-urgent requests.  _______________________________________________________   I appreciate the  opportunity to care for you  Thank You   Jacelyn Grip

## 2021-11-30 ENCOUNTER — Other Ambulatory Visit (HOSPITAL_COMMUNITY)
Admission: RE | Admit: 2021-11-30 | Discharge: 2021-11-30 | Disposition: A | Discharge status: Home / Self Care | Payer: Medicaid Other | Source: Ambulatory Visit | Attending: Nurse Practitioner | Admitting: Nurse Practitioner

## 2021-11-30 ENCOUNTER — Ambulatory Visit: Payer: Medicaid Other | Attending: Nurse Practitioner | Admitting: Nurse Practitioner

## 2021-11-30 ENCOUNTER — Encounter: Payer: Self-pay | Admitting: Nurse Practitioner

## 2021-11-30 VITALS — BP 110/79 | HR 96 | Temp 98.4°F | Resp 16 | Ht 59.0 in | Wt 150.0 lb

## 2021-11-30 DIAGNOSIS — L659 Nonscarring hair loss, unspecified: Secondary | ICD-10-CM

## 2021-11-30 DIAGNOSIS — Z124 Encounter for screening for malignant neoplasm of cervix: Secondary | ICD-10-CM | POA: Insufficient documentation

## 2021-11-30 DIAGNOSIS — R7309 Other abnormal glucose: Secondary | ICD-10-CM

## 2021-11-30 DIAGNOSIS — D72829 Elevated white blood cell count, unspecified: Secondary | ICD-10-CM

## 2021-11-30 DIAGNOSIS — Z114 Encounter for screening for human immunodeficiency virus [HIV]: Secondary | ICD-10-CM | POA: Diagnosis not present

## 2021-11-30 DIAGNOSIS — F17200 Nicotine dependence, unspecified, uncomplicated: Secondary | ICD-10-CM

## 2021-11-30 MED ORDER — NICOTINE POLACRILEX 4 MG MT GUM: 4.0000 mg | CHEWING_GUM | OROMUCOSAL | 3 refills | Status: DC | PRN

## 2021-11-30 NOTE — Progress Notes (Signed)
Assessment & Plan:  Kelsey Davies was seen today for gynecologic exam.  Diagnoses and all orders for this visit:  Encounter for Papanicolaou smear for cervical cancer screening -     Cytology - PAP -     Cervicovaginal ancillary only  Encounter for screening for HIV -     HIV antibody (with reflex)  Leukocytosis, unspecified type -     CBC with Differential  Elevated glucose -     CMP14+EGFR  Hair thinning -     Thyroid Panel With TSH  Tobacco dependence -     nicotine polacrilex (NICORETTE) 4 MG gum; Take 1 each (4 mg total) by mouth as needed for smoking cessation.    Patient has been counseled on age-appropriate routine health concerns for screening and prevention. These are reviewed and up-to-date. Referrals have been placed accordingly. Immunizations are up-to-date or declined.    Subjective:   Chief Complaint  Patient presents with   Gynecologic Exam    Having foul odor discharge, hx of uti, labial itching and burning   HPI Kelsey Davies 31 y.o. female presents to office today for pap smear. Endorses malodorous vaginal discharge, labial itching and burning.    Review of Systems  Constitutional: Negative.  Negative for chills, fever, malaise/fatigue and weight loss.  Respiratory: Negative.  Negative for cough, shortness of breath and wheezing.   Cardiovascular: Negative.  Negative for chest pain, orthopnea and leg swelling.  Gastrointestinal:  Negative for abdominal pain.  Genitourinary:  Negative for flank pain.       SEE HPI  Skin: Negative.  Negative for rash.  Psychiatric/Behavioral:  Negative for suicidal ideas.     Past Medical History:  Diagnosis Date   Medical history non-contributory     Past Surgical History:  Procedure Laterality Date   NO PAST SURGERIES      Family History  Problem Relation Age of Onset   Healthy Mother    Rheum arthritis Maternal Grandmother    Rheum arthritis Maternal Grandfather    Asthma Paternal Grandmother     Rheum arthritis Sister    Rheum arthritis Brother    Hearing loss Neg Hx    Colon cancer Neg Hx    Stomach cancer Neg Hx    Pancreatic cancer Neg Hx     Social History Reviewed with no changes to be made today.   Outpatient Medications Prior to Visit  Medication Sig Dispense Refill   meloxicam (MOBIC) 7.5 MG tablet Take 1 tablet (7.5 mg total) by mouth daily. For knee pain. Take with food (Patient not taking: Reported on 11/30/2021) 30 tablet 0   Misc. Devices MISC Please supply with size M adult diapers, gloves and wipes. ICD 10 N39.3 (Patient not taking: Reported on 11/30/2021) 1 each 0   No facility-administered medications prior to visit.    No Known Allergies     Objective:    BP 110/79 (BP Location: Right Arm, Patient Position: Sitting, Cuff Size: Normal)   Pulse 96   Temp 98.4 F (36.9 C)   Resp 16   Ht '4\' 11"'  (1.499 m)   Wt 150 lb (68 kg)   SpO2 100%   BMI 30.30 kg/m  Wt Readings from Last 3 Encounters:  11/30/21 150 lb (68 kg)  10/19/21 148 lb 8 oz (67.4 kg)  08/29/21 152 lb 12.8 oz (69.3 kg)    Physical Exam Exam conducted with a chaperone present.  Constitutional:      Appearance: She is well-developed.  HENT:     Head: Normocephalic.  Cardiovascular:     Rate and Rhythm: Normal rate and regular rhythm.     Heart sounds: Normal heart sounds.  Pulmonary:     Effort: Pulmonary effort is normal.     Breath sounds: Normal breath sounds.  Abdominal:     General: Bowel sounds are normal.     Palpations: Abdomen is soft.     Hernia: There is no hernia in the left inguinal area.  Genitourinary:    Exam position: Lithotomy position.     Labia:        Right: No rash, tenderness, lesion or injury.        Left: No rash, tenderness, lesion or injury.      Vagina: No signs of injury and foreign body. Vaginal discharge present. No erythema, tenderness or bleeding.     Cervix: Discharge present. No erythema or cervical bleeding.     Uterus: Not deviated and not  enlarged.      Adnexa:        Right: No mass, tenderness or fullness.         Left: No mass, tenderness or fullness.       Rectum: Normal. No external hemorrhoid.  Lymphadenopathy:     Lower Body: No right inguinal adenopathy. No left inguinal adenopathy.  Skin:    General: Skin is warm and dry.  Neurological:     Mental Status: She is alert and oriented to person, place, and time.  Psychiatric:        Behavior: Behavior normal.        Thought Content: Thought content normal.        Judgment: Judgment normal.          Patient has been counseled extensively about nutrition and exercise as well as the importance of adherence with medications and regular follow-up. The patient was given clear instructions to go to ER or return to medical center if symptoms don't improve, worsen or new problems develop. The patient verbalized understanding.   Follow-up: Return in about 6 months (around 06/02/2022).   Gildardo Pounds, FNP-BC [facility] [place] [facility] [place]   11/30/2021, 1:24 PM

## 2021-12-01 LAB — CBC WITH DIFFERENTIAL/PLATELET
Basophils Absolute: 0.1 10*3/uL (ref 0.0–0.2)
Basos: 1 %
EOS (ABSOLUTE): 0.6 10*3/uL — ABNORMAL HIGH (ref 0.0–0.4)
Eos: 5 %
Hematocrit: 39.7 % (ref 34.0–46.6)
Hemoglobin: 13.2 g/dL (ref 11.1–15.9)
Immature Grans (Abs): 0.1 10*3/uL (ref 0.0–0.1)
Immature Granulocytes: 1 %
Lymphocytes Absolute: 3.4 10*3/uL — ABNORMAL HIGH (ref 0.7–3.1)
Lymphs: 27 %
MCH: 27 pg (ref 26.6–33.0)
MCHC: 33.2 g/dL (ref 31.5–35.7)
MCV: 81 fL (ref 79–97)
Monocytes Absolute: 0.6 10*3/uL (ref 0.1–0.9)
Monocytes: 5 %
Neutrophils Absolute: 8 10*3/uL — ABNORMAL HIGH (ref 1.4–7.0)
Neutrophils: 61 %
Platelets: 494 10*3/uL — ABNORMAL HIGH (ref 150–450)
RBC: 4.89 x10E6/uL (ref 3.77–5.28)
RDW: 12.7 % (ref 11.7–15.4)
WBC: 12.8 10*3/uL — ABNORMAL HIGH (ref 3.4–10.8)

## 2021-12-01 LAB — CMP14+EGFR
ALT: 34 IU/L — ABNORMAL HIGH (ref 0–32)
AST: 20 IU/L (ref 0–40)
Albumin/Globulin Ratio: 1.6 (ref 1.2–2.2)
Albumin: 4.1 g/dL (ref 4.0–5.0)
Alkaline Phosphatase: 100 IU/L (ref 44–121)
BUN/Creatinine Ratio: 12 (ref 9–23)
BUN: 7 mg/dL (ref 6–20)
Bilirubin Total: 0.3 mg/dL (ref 0.0–1.2)
CO2: 24 mmol/L (ref 20–29)
Calcium: 9.6 mg/dL (ref 8.7–10.2)
Chloride: 100 mmol/L (ref 96–106)
Creatinine, Ser: 0.6 mg/dL (ref 0.57–1.00)
Globulin, Total: 2.6 g/dL (ref 1.5–4.5)
Glucose: 92 mg/dL (ref 70–99)
Potassium: 4.4 mmol/L (ref 3.5–5.2)
Sodium: 138 mmol/L (ref 134–144)
Total Protein: 6.7 g/dL (ref 6.0–8.5)
eGFR: 125 mL/min/{1.73_m2} (ref >59)

## 2021-12-01 LAB — THYROID PANEL WITH TSH
Free Thyroxine Index: 1.7 (ref 1.2–4.9)
T3 Uptake Ratio: 20 % — ABNORMAL LOW (ref 24–39)
T4, Total: 8.3 ug/dL (ref 4.5–12.0)
TSH: 1.9 u[IU]/mL (ref 0.450–4.500)

## 2021-12-01 LAB — HIV ANTIBODY (ROUTINE TESTING W REFLEX): HIV Screen 4th Generation wRfx: NONREACTIVE

## 2021-12-03 ENCOUNTER — Other Ambulatory Visit: Payer: Self-pay | Admitting: Nurse Practitioner

## 2021-12-03 DIAGNOSIS — D72829 Elevated white blood cell count, unspecified: Secondary | ICD-10-CM

## 2021-12-03 LAB — CERVICOVAGINAL ANCILLARY ONLY
Bacterial Vaginitis (gardnerella): NEGATIVE
Candida Glabrata: NEGATIVE
Candida Vaginitis: NEGATIVE
Chlamydia: NEGATIVE
Comment: NEGATIVE
Comment: NEGATIVE
Comment: NEGATIVE
Comment: NEGATIVE
Comment: NEGATIVE
Comment: NORMAL
Neisseria Gonorrhea: NEGATIVE
Trichomonas: NEGATIVE

## 2021-12-04 ENCOUNTER — Telehealth: Payer: Self-pay | Admitting: Nurse Practitioner

## 2021-12-04 LAB — CYTOLOGY - PAP
Adequacy: ABSENT
Comment: NEGATIVE
Diagnosis: NEGATIVE
High risk HPV: NEGATIVE

## 2021-12-04 NOTE — Telephone Encounter (Signed)
Copied from CRM [id number]. Topic: Referral - Question >> Dec 04, 2021 12:26 PM Everette C wrote: Reason for CRM: Kem Boroughs with [facility] would like to speak with a member of clinical staff when possible to confirm additional information related to the patient's recent referral to their facility   Darl Pikes would like to make sure that this is the most appropriate place for the patient to receive treatment, [facility] is closer to the patient and Darl Pikes wanted to ensure that there would be no transportation issues for the patient   Please contact further when possible

## 2021-12-06 ENCOUNTER — Telehealth: Payer: Self-pay | Admitting: Nurse Practitioner

## 2021-12-06 NOTE — Telephone Encounter (Signed)
Kelsey Davies is aware of patient response.

## 2021-12-06 NOTE — Telephone Encounter (Signed)
Copied from CRM [id number]. Topic: Referral - Question >> Dec 06, 2021 12:33 PM Macon Large wrote: Kem Boroughs with [facility] would like to speak with a member of the clinical staff when possible to confirm that the referral request was meant to be sent there. Darl Pikes requests that either a staff message through Epic be sent or call her at [phone]

## 2021-12-06 NOTE — Telephone Encounter (Signed)
Pt aware of distance and is willing to drive to location.

## 2021-12-07 ENCOUNTER — Telehealth: Payer: Self-pay | Admitting: Oncology

## 2021-12-07 NOTE — Telephone Encounter (Signed)
Scheduled appt per 8/15 referral. Pt is aware of appt date and time. Pt is aware to arrive 15 mins prior to appt time and to bring and updated insurance card. Pt is aware of appt location.   

## 2021-12-21 ENCOUNTER — Other Ambulatory Visit: Payer: Self-pay

## 2021-12-21 ENCOUNTER — Inpatient Hospital Stay: Payer: Medicaid Other | Attending: Oncology | Admitting: Oncology

## 2021-12-21 DIAGNOSIS — D71 Functional disorders of polymorphonuclear neutrophils: Secondary | ICD-10-CM | POA: Insufficient documentation

## 2021-12-21 DIAGNOSIS — K760 Fatty (change of) liver, not elsewhere classified: Secondary | ICD-10-CM | POA: Diagnosis not present

## 2021-12-21 DIAGNOSIS — D72828 Other elevated white blood cell count: Secondary | ICD-10-CM | POA: Insufficient documentation

## 2021-12-21 DIAGNOSIS — F17200 Nicotine dependence, unspecified, uncomplicated: Secondary | ICD-10-CM | POA: Insufficient documentation

## 2021-12-21 DIAGNOSIS — Z789 Other specified health status: Secondary | ICD-10-CM

## 2021-12-21 NOTE — Progress Notes (Unsigned)
[facility] Initial Visit:  Patient Care Team: Gildardo Pounds, NP as PCP - General (Nurse Practitioner)  CHIEF COMPLAINTS/PURPOSE OF CONSULTATION:  Oncology History   No history exists.    HISTORY OF PRESENTING ILLNESS: Kelsey Davies 31 y.o. female is here because of granulocytosis Has history of fatty liver. Patient is G2 P2; youngest child is 3 yrs ago.   Denies history of recurrent infections.    Social:  Formerly chewed tobacco but now chews nicotine gum.  EtOH none    Review of Systems  Constitutional:  Negative for chills, fatigue and fever.  HENT:   Negative for mouth sores, sore throat and trouble swallowing.   Eyes:  Negative for eye problems and icterus.  Respiratory:  Negative for cough, hemoptysis and shortness of breath.   Cardiovascular:  Negative for chest pain, leg swelling and palpitations.  Gastrointestinal:  Positive for constipation. Negative for abdominal pain, diarrhea and nausea.  Genitourinary:  Negative for difficulty urinating, dysuria and hematuria.   Musculoskeletal:  Negative for arthralgias, back pain, flank pain, myalgias and neck pain.  Hematological:  Negative for adenopathy. Does not bruise/bleed easily.    MEDICAL HISTORY: Past Medical History:  Diagnosis Date   Medical history non-contributory     SURGICAL HISTORY: Past Surgical History:  Procedure Laterality Date   NO PAST SURGERIES      SOCIAL HISTORY: Social History   Socioeconomic History   Marital status: Married    Spouse name: Not on file   Number of children: Not on file   Years of education: Not on file   Highest education level: Not on file  Occupational History   Not on file  Tobacco Use   Smoking status: Never   Smokeless tobacco: Never  Vaping Use   Vaping Use: Never used  Substance and Sexual Activity   Alcohol use: No   Drug use: No   Sexual activity: Yes    Birth control/protection: None  Other Topics Concern   Not on file   Social History Narrative   Not on file   Social Determinants of Health   Financial Resource Strain: Not on file  Food Insecurity: No Food Insecurity (06/08/2018)   Hunger Vital Sign    Worried About Running Out of Food in the Last Year: Never true    Ran Out of Food in the Last Year: Never true  Transportation Needs: No Transportation Needs (06/08/2018)   PRAPARE - Hydrologist (Medical): No    Lack of Transportation (Non-Medical): No  Physical Activity: Not on file  Stress: Not on file  Social Connections: Not on file  Intimate Partner Violence: Not on file    FAMILY HISTORY Family History  Problem Relation Age of Onset   Healthy Mother    Rheum arthritis Maternal Grandmother    Rheum arthritis Maternal Grandfather    Asthma Paternal Grandmother    Rheum arthritis Sister    Rheum arthritis Brother    Hearing loss Neg Hx    Colon cancer Neg Hx    Stomach cancer Neg Hx    Pancreatic cancer Neg Hx     ALLERGIES:  has No Known Allergies.  MEDICATIONS:  Current Outpatient Medications  Medication Sig Dispense Refill   meloxicam (MOBIC) 7.5 MG tablet Take 1 tablet (7.5 mg total) by mouth daily. For knee pain. Take with food (Patient not taking: Reported on 11/30/2021) 30 tablet 0   Misc. Devices MISC Please supply with  size M adult diapers, gloves and wipes. ICD 10 N39.3 (Patient not taking: Reported on 11/30/2021) 1 each 0   nicotine polacrilex (NICORETTE) 4 MG gum Take 1 each (4 mg total) by mouth as needed for smoking cessation. 100 tablet 3   No current facility-administered medications for this visit.    PHYSICAL EXAMINATION:  ECOG PERFORMANCE STATUS: 0 - Asymptomatic   Vitals:   12/21/21 1509  BP: 110/74  Pulse: 88  Resp: 16  Temp: 97.6 F (36.4 C)  SpO2: 100%    Filed Weights   12/21/21 1509  Weight: 152 lb 14.4 oz (69.4 kg)     Physical Exam Vitals and nursing note reviewed.  Constitutional:      General: She is not in  acute distress.    Appearance: Normal appearance. She is obese. She is not ill-appearing, toxic-appearing or diaphoretic.     Comments: Here alone.  Video translator used  HENT:     Head: Normocephalic and atraumatic.     Right Ear: External ear normal.     Left Ear: External ear normal.     Nose: Nose normal. No congestion or rhinorrhea.  Eyes:     General: No scleral icterus.    Extraocular Movements: Extraocular movements intact.     Conjunctiva/sclera: Conjunctivae normal.     Pupils: Pupils are equal, round, and reactive to light.  Cardiovascular:     Rate and Rhythm: Normal rate and regular rhythm.     Heart sounds: Normal heart sounds. No murmur heard.    No friction rub. No gallop.  Pulmonary:     Effort: Pulmonary effort is normal. No respiratory distress.     Breath sounds: Normal breath sounds. No stridor. No wheezing, rhonchi or rales.  Chest:     Chest wall: No tenderness.  Abdominal:     General: Bowel sounds are normal. There is no distension.     Palpations: Abdomen is soft. There is no mass.     Tenderness: There is no abdominal tenderness. There is no guarding.     Hernia: No hernia is present.  Musculoskeletal:        General: No swelling, tenderness or deformity.     Cervical back: Normal range of motion and neck supple. No rigidity or tenderness.     Right lower leg: No edema.     Left lower leg: No edema.  Lymphadenopathy:     Head:     Right side of head: No submental, submandibular, tonsillar, preauricular, posterior auricular or occipital adenopathy.     Left side of head: No submental, submandibular, tonsillar, preauricular, posterior auricular or occipital adenopathy.     Cervical: No cervical adenopathy.     Right cervical: No superficial, deep or posterior cervical adenopathy.    Left cervical: No superficial, deep or posterior cervical adenopathy.     Upper Body:     Right upper body: No supraclavicular, axillary, pectoral or epitrochlear  adenopathy.     Left upper body: No supraclavicular, axillary, pectoral or epitrochlear adenopathy.  Skin:    General: Skin is warm.     Coloration: Skin is not jaundiced or pale.     Findings: No bruising, erythema or rash.  Neurological:     General: No focal deficit present.     Mental Status: She is alert and oriented to person, place, and time. Mental status is at baseline.     Cranial Nerves: No cranial nerve deficit.  Psychiatric:  Mood and Affect: Mood normal.        Behavior: Behavior normal.        Thought Content: Thought content normal.        Judgment: Judgment normal.     LABORATORY DATA: I have personally reviewed the data as listed:  Office Visit on 11/30/2021  Component Date Value Ref Range Status   High risk HPV 11/30/2021 Negative   Final   Adequacy 11/30/2021 Satisfactory for evaluation; transformation zone component ABSENT.   Final   Diagnosis 11/30/2021 - Negative for intraepithelial lesion or malignancy (NILM)   Final   Comment 11/30/2021 Normal Reference Range HPV - Negative   Final   Bacterial Vaginitis (gardnerella) 11/30/2021 Negative   Final   Chlamydia 11/30/2021 Negative   Final   Neisseria Gonorrhea 11/30/2021 Negative   Final   Candida Vaginitis 11/30/2021 Negative   Final   Candida Glabrata 11/30/2021 Negative   Final   Trichomonas 11/30/2021 Negative   Final   Comment 11/30/2021 Normal Reference Range Bacterial Vaginosis - Negative   Final   Comment 11/30/2021 Normal Reference Ranger Chlamydia - Negative   Final   Comment 11/30/2021 Normal Reference Range Neisseria Gonorrhea - Negative   Final   Comment 11/30/2021 Normal Reference Range Candida Species - Negative   Final   Comment 11/30/2021 Normal Reference Range Candida Galbrata - Negative   Final   Comment 11/30/2021 Normal Reference Range Trichomonas - Negative   Final   HIV Screen 4th Generation wRfx 11/30/2021 Non Reactive  Non Reactive Final   Comment: HIV Negative HIV-1/HIV-2  antibodies and HIV-1 p24 antigen were NOT detected. There is no laboratory evidence of HIV infection.    WBC 11/30/2021 12.8 (H)  3.4 - 10.8 x10E3/uL Final   RBC 11/30/2021 4.89  3.77 - 5.28 x10E6/uL Final   Hemoglobin 11/30/2021 13.2  11.1 - 15.9 g/dL Final   Hematocrit 11/30/2021 39.7  34.0 - 46.6 % Final   MCV 11/30/2021 81  79 - 97 fL Final   MCH 11/30/2021 27.0  26.6 - 33.0 pg Final   MCHC 11/30/2021 33.2  31.5 - 35.7 g/dL Final   RDW 11/30/2021 12.7  11.7 - 15.4 % Final   Platelets 11/30/2021 494 (H)  150 - 450 x10E3/uL Final   Neutrophils 11/30/2021 61  Not Estab. % Final   Lymphs 11/30/2021 27  Not Estab. % Final   Monocytes 11/30/2021 5  Not Estab. % Final   Eos 11/30/2021 5  Not Estab. % Final   Basos 11/30/2021 1  Not Estab. % Final   Neutrophils Absolute 11/30/2021 8.0 (H)  1.4 - 7.0 x10E3/uL Final   Lymphocytes Absolute 11/30/2021 3.4 (H)  0.7 - 3.1 x10E3/uL Final   Monocytes Absolute 11/30/2021 0.6  0.1 - 0.9 x10E3/uL Final   EOS (ABSOLUTE) 11/30/2021 0.6 (H)  0.0 - 0.4 x10E3/uL Final   Basophils Absolute 11/30/2021 0.1  0.0 - 0.2 x10E3/uL Final   Immature Granulocytes 11/30/2021 1  Not Estab. % Final   Immature Grans (Abs) 11/30/2021 0.1  0.0 - 0.1 x10E3/uL Final   Glucose 11/30/2021 92  70 - 99 mg/dL Final   BUN 11/30/2021 7  6 - 20 mg/dL Final   Creatinine, Ser 11/30/2021 0.60  0.57 - 1.00 mg/dL Final   eGFR 11/30/2021 125  >59 mL/min/1.73 Final   BUN/Creatinine Ratio 11/30/2021 12  9 - 23 Final   Sodium 11/30/2021 138  134 - 144 mmol/L Final   Potassium 11/30/2021 4.4  3.5 - 5.2  mmol/L Final   Chloride 11/30/2021 100  96 - 106 mmol/L Final   CO2 11/30/2021 24  20 - 29 mmol/L Final   Calcium 11/30/2021 9.6  8.7 - 10.2 mg/dL Final   Total Protein 11/30/2021 6.7  6.0 - 8.5 g/dL Final   Albumin 11/30/2021 4.1  4.0 - 5.0 g/dL Final   Globulin, Total 11/30/2021 2.6  1.5 - 4.5 g/dL Final   Albumin/Globulin Ratio 11/30/2021 1.6  1.2 - 2.2 Final   Bilirubin Total  11/30/2021 0.3  0.0 - 1.2 mg/dL Final   Alkaline Phosphatase 11/30/2021 100  44 - 121 IU/L Final   AST 11/30/2021 20  0 - 40 IU/L Final   ALT 11/30/2021 34 (H)  0 - 32 IU/L Final   TSH 11/30/2021 1.900  0.450 - 4.500 uIU/mL Final   T4, Total 11/30/2021 8.3  4.5 - 12.0 ug/dL Final   T3 Uptake Ratio 11/30/2021 20 (L)  24 - 39 % Final   Free Thyroxine Index 11/30/2021 1.7  1.2 - 4.9 Final    RADIOGRAPHIC STUDIES: I have personally reviewed the radiological images as listed and agree with the findings in the report  No results found.  ASSESSMENT/PLAN   Main Causes of Neutrophilia Physiologic  Neonates, exercise, emotion, pregnancy, parturition, lactation  Acute infections Bacterial Various pyogenic cocci, Escherichia coli, Pseudomonas aeruginosa, Corynebacterium diphtheriae, Francisella tularensis   Spirochaetal Syphilis, Leptospirosis   Rickettsial Typhus, Rocky [place] spotted fever   Chlamydial psittacosis   Viral Rabies, poliomyelitis, smallpox, herpes simplex infection, herpes zoster, chickenpox   Protozoal Pneumocystis carinii infection   Mycotic actinomycosis, coccidioidomycosis   Helmenthic liver fluke, filariasis  Acute Inflammation not caused by infection  Surgery, burns, infarcts, hepatic necrosis, crush injuries, RA, rheumatic fever, vasculitis, myositis, pancreatitis, hypersensitivity reactions.   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   No problem-specific Assessment & Plan notes found for this encounter.   No orders of the defined types were placed in this encounter.   All questions were answered. The patient knows to call the clinic with any problems, questions or concerns.  This note was electronically signed.    Barbee Cough, MD  12/21/2021 4:24 PM

## 2021-12-25 ENCOUNTER — Telehealth: Payer: Self-pay | Admitting: Oncology

## 2021-12-25 DIAGNOSIS — Z789 Other specified health status: Secondary | ICD-10-CM | POA: Insufficient documentation

## 2021-12-25 NOTE — Telephone Encounter (Signed)
Scheduled appt per 9/1 los. Called pt, no answer. Left msg with appt date/time.  

## 2022-01-10 NOTE — Progress Notes (Deleted)
[facility] Initial Visit:  Patient Care Team: Gildardo Pounds, NP as PCP - General (Nurse Practitioner)  CHIEF COMPLAINTS/PURPOSE OF CONSULTATION:  Oncology History   No history exists.    HISTORY OF PRESENTING ILLNESS:  December 21 2021:  [facility]  Kelsey Davies 31 y.o. female is here because of granulocytosis Has history of fatty liver. Patient is G2 P2; youngest child is 3 yrs ago.   Denies history of recurrent infections.    Social:  Formerly chewed tobacco but now chews nicotine gum.  EtOH none    Review of Systems  Constitutional:  Negative for chills, fatigue and fever.  HENT:   Negative for mouth sores, sore throat and trouble swallowing.   Eyes:  Negative for eye problems and icterus.  Respiratory:  Negative for cough, hemoptysis and shortness of breath.   Cardiovascular:  Negative for chest pain, leg swelling and palpitations.  Gastrointestinal:  Positive for constipation. Negative for abdominal pain, diarrhea and nausea.  Genitourinary:  Negative for difficulty urinating, dysuria and hematuria.   Musculoskeletal:  Negative for arthralgias, back pain, flank pain, myalgias and neck pain.  Hematological:  Negative for adenopathy. Does not bruise/bleed easily.    MEDICAL HISTORY: Past Medical History:  Diagnosis Date   Medical history non-contributory     SURGICAL HISTORY: Past Surgical History:  Procedure Laterality Date   NO PAST SURGERIES      SOCIAL HISTORY: Social History   Socioeconomic History   Marital status: Married    Spouse name: Not on file   Number of children: Not on file   Years of education: Not on file   Highest education level: Not on file  Occupational History   Not on file  Tobacco Use   Smoking status: Never   Smokeless tobacco: Never  Vaping Use   Vaping Use: Never used  Substance and Sexual Activity   Alcohol use: No   Drug use: No   Sexual activity: Yes    Birth  control/protection: None  Other Topics Concern   Not on file  Social History Narrative   Not on file   Social Determinants of Health   Financial Resource Strain: Not on file  Food Insecurity: No Food Insecurity (06/08/2018)   Hunger Vital Sign    Worried About Running Out of Food in the Last Year: Never true    Ran Out of Food in the Last Year: Never true  Transportation Needs: No Transportation Needs (06/08/2018)   PRAPARE - Hydrologist (Medical): No    Lack of Transportation (Non-Medical): No  Physical Activity: Not on file  Stress: Not on file  Social Connections: Not on file  Intimate Partner Violence: Not on file    FAMILY HISTORY Family History  Problem Relation Age of Onset   Healthy Mother    Rheum arthritis Maternal Grandmother    Rheum arthritis Maternal Grandfather    Asthma Paternal Grandmother    Rheum arthritis Sister    Rheum arthritis Brother    Hearing loss Neg Hx    Colon cancer Neg Hx    Stomach cancer Neg Hx    Pancreatic cancer Neg Hx     ALLERGIES:  has No Known Allergies.  MEDICATIONS:  Current Outpatient Medications  Medication Sig Dispense Refill   meloxicam (MOBIC) 7.5 MG tablet Take 1 tablet (7.5 mg total) by mouth daily. For knee pain. Take with food (Patient not taking: Reported on 11/30/2021) 30  tablet 0   Misc. Devices MISC Please supply with size M adult diapers, gloves and wipes. ICD 10 N39.3 (Patient not taking: Reported on 11/30/2021) 1 each 0   nicotine polacrilex (NICORETTE) 4 MG gum Take 1 each (4 mg total) by mouth as needed for smoking cessation. 100 tablet 3   No current facility-administered medications for this visit.    PHYSICAL EXAMINATION:  ECOG PERFORMANCE STATUS: 0 - Asymptomatic   There were no vitals filed for this visit.   There were no vitals filed for this visit.    Physical Exam Vitals and nursing note reviewed.  Constitutional:      General: She is not in acute distress.     Appearance: Normal appearance. She is obese. She is not ill-appearing, toxic-appearing or diaphoretic.     Comments: Here alone.  Video translator used  HENT:     Head: Normocephalic and atraumatic.     Right Ear: External ear normal.     Left Ear: External ear normal.     Nose: Nose normal. No congestion or rhinorrhea.  Eyes:     General: No scleral icterus.    Extraocular Movements: Extraocular movements intact.     Conjunctiva/sclera: Conjunctivae normal.     Pupils: Pupils are equal, round, and reactive to light.  Cardiovascular:     Rate and Rhythm: Normal rate and regular rhythm.     Heart sounds: Normal heart sounds. No murmur heard.    No friction rub. No gallop.  Pulmonary:     Effort: Pulmonary effort is normal. No respiratory distress.     Breath sounds: Normal breath sounds. No stridor. No wheezing, rhonchi or rales.  Chest:     Chest wall: No tenderness.  Abdominal:     General: Bowel sounds are normal. There is no distension.     Palpations: Abdomen is soft. There is no mass.     Tenderness: There is no abdominal tenderness. There is no guarding.     Hernia: No hernia is present.  Musculoskeletal:        General: No swelling, tenderness or deformity.     Cervical back: Normal range of motion and neck supple. No rigidity or tenderness.     Right lower leg: No edema.     Left lower leg: No edema.  Lymphadenopathy:     Head:     Right side of head: No submental, submandibular, tonsillar, preauricular, posterior auricular or occipital adenopathy.     Left side of head: No submental, submandibular, tonsillar, preauricular, posterior auricular or occipital adenopathy.     Cervical: No cervical adenopathy.     Right cervical: No superficial, deep or posterior cervical adenopathy.    Left cervical: No superficial, deep or posterior cervical adenopathy.     Upper Body:     Right upper body: No supraclavicular, axillary, pectoral or epitrochlear adenopathy.     Left upper  body: No supraclavicular, axillary, pectoral or epitrochlear adenopathy.  Skin:    General: Skin is warm.     Coloration: Skin is not jaundiced or pale.     Findings: No bruising, erythema or rash.  Neurological:     General: No focal deficit present.     Mental Status: She is alert and oriented to person, place, and time. Mental status is at baseline.     Cranial Nerves: No cranial nerve deficit.  Psychiatric:        Mood and Affect: Mood normal.  Behavior: Behavior normal.        Thought Content: Thought content normal.        Judgment: Judgment normal.      LABORATORY DATA: I have personally reviewed the data as listed:  No visits with results within 1 Month(s) from this visit.  Latest known visit with results is:  Office Visit on 11/30/2021  Component Date Value Ref Range Status   High risk HPV 11/30/2021 Negative   Final   Adequacy 11/30/2021 Satisfactory for evaluation; transformation zone component ABSENT.   Final   Diagnosis 11/30/2021 - Negative for intraepithelial lesion or malignancy (NILM)   Final   Comment 11/30/2021 Normal Reference Range HPV - Negative   Final   Bacterial Vaginitis (gardnerella) 11/30/2021 Negative   Final   Chlamydia 11/30/2021 Negative   Final   Neisseria Gonorrhea 11/30/2021 Negative   Final   Candida Vaginitis 11/30/2021 Negative   Final   Candida Glabrata 11/30/2021 Negative   Final   Trichomonas 11/30/2021 Negative   Final   Comment 11/30/2021 Normal Reference Range Bacterial Vaginosis - Negative   Final   Comment 11/30/2021 Normal Reference Ranger Chlamydia - Negative   Final   Comment 11/30/2021 Normal Reference Range Neisseria Gonorrhea - Negative   Final   Comment 11/30/2021 Normal Reference Range Candida Species - Negative   Final   Comment 11/30/2021 Normal Reference Range Candida Galbrata - Negative   Final   Comment 11/30/2021 Normal Reference Range Trichomonas - Negative   Final   HIV Screen 4th Generation wRfx 11/30/2021  Non Reactive  Non Reactive Final   Comment: HIV Negative HIV-1/HIV-2 antibodies and HIV-1 p24 antigen were NOT detected. There is no laboratory evidence of HIV infection.    WBC 11/30/2021 12.8 (H)  3.4 - 10.8 x10E3/uL Final   RBC 11/30/2021 4.89  3.77 - 5.28 x10E6/uL Final   Hemoglobin 11/30/2021 13.2  11.1 - 15.9 g/dL Final   Hematocrit 11/30/2021 39.7  34.0 - 46.6 % Final   MCV 11/30/2021 81  79 - 97 fL Final   MCH 11/30/2021 27.0  26.6 - 33.0 pg Final   MCHC 11/30/2021 33.2  31.5 - 35.7 g/dL Final   RDW 11/30/2021 12.7  11.7 - 15.4 % Final   Platelets 11/30/2021 494 (H)  150 - 450 x10E3/uL Final   Neutrophils 11/30/2021 61  Not Estab. % Final   Lymphs 11/30/2021 27  Not Estab. % Final   Monocytes 11/30/2021 5  Not Estab. % Final   Eos 11/30/2021 5  Not Estab. % Final   Basos 11/30/2021 1  Not Estab. % Final   Neutrophils Absolute 11/30/2021 8.0 (H)  1.4 - 7.0 x10E3/uL Final   Lymphocytes Absolute 11/30/2021 3.4 (H)  0.7 - 3.1 x10E3/uL Final   Monocytes Absolute 11/30/2021 0.6  0.1 - 0.9 x10E3/uL Final   EOS (ABSOLUTE) 11/30/2021 0.6 (H)  0.0 - 0.4 x10E3/uL Final   Basophils Absolute 11/30/2021 0.1  0.0 - 0.2 x10E3/uL Final   Immature Granulocytes 11/30/2021 1  Not Estab. % Final   Immature Grans (Abs) 11/30/2021 0.1  0.0 - 0.1 x10E3/uL Final   Glucose 11/30/2021 92  70 - 99 mg/dL Final   BUN 11/30/2021 7  6 - 20 mg/dL Final   Creatinine, Ser 11/30/2021 0.60  0.57 - 1.00 mg/dL Final   eGFR 11/30/2021 125  >59 mL/min/1.73 Final   BUN/Creatinine Ratio 11/30/2021 12  9 - 23 Final   Sodium 11/30/2021 138  134 - 144 mmol/L Final  Potassium 11/30/2021 4.4  3.5 - 5.2 mmol/L Final   Chloride 11/30/2021 100  96 - 106 mmol/L Final   CO2 11/30/2021 24  20 - 29 mmol/L Final   Calcium 11/30/2021 9.6  8.7 - 10.2 mg/dL Final   Total Protein 11/30/2021 6.7  6.0 - 8.5 g/dL Final   Albumin 11/30/2021 4.1  4.0 - 5.0 g/dL Final   Globulin, Total 11/30/2021 2.6  1.5 - 4.5 g/dL Final    Albumin/Globulin Ratio 11/30/2021 1.6  1.2 - 2.2 Final   Bilirubin Total 11/30/2021 0.3  0.0 - 1.2 mg/dL Final   Alkaline Phosphatase 11/30/2021 100  44 - 121 IU/L Final   AST 11/30/2021 20  0 - 40 IU/L Final   ALT 11/30/2021 34 (H)  0 - 32 IU/L Final   TSH 11/30/2021 1.900  0.450 - 4.500 uIU/mL Final   T4, Total 11/30/2021 8.3  4.5 - 12.0 ug/dL Final   T3 Uptake Ratio 11/30/2021 20 (L)  24 - 39 % Final   Free Thyroxine Index 11/30/2021 1.7  1.2 - 4.9 Final    RADIOGRAPHIC STUDIES: I have personally reviewed the radiological images as listed and agree with the findings in the report  No results found.  ASSESSMENT/PLAN  31 year old female with neutrophilia  Possible causes of neutrophilia in this patient Physiologic  exercise, emotion, pregnancy,   Acute infections Bacterial Various pyogenic cocci, Escherichia coli, Pseudomonas aeruginosa, Corynebacterium diphtheriae, Francisella tularensis   Spirochaetal Syphilis, Leptospirosis   Rickettsial Typhus, Rocky Mountain spotted fever   Chlamydial psittacosis   Protozoal Pneumocystis carinii infection   Mycotic actinomycosis, coccidioidomycosis   Helmenthic liver fluke, filariasis  Acute Inflammation not caused by infection  RA, rheumatic fever, vasculitis, myositis, pancreatitis, hypersensitivity reactions.   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    Translator:  Patient's primary language is [place].  A certified medical translator available via telephone/ video/ in person was used to provide  interpreting services during the visit.          Cancer Staging  No matching staging information was found for the patient.   No problem-specific Assessment & Plan notes found for this encounter.   No orders of the defined types were placed in this encounter.   All questions were answered. The patient knows to call the clinic with any problems, questions or concerns.  This note was electronically signed.    Barbee Cough, MD  01/10/2022 1:59 PM

## 2022-01-11 ENCOUNTER — Other Ambulatory Visit: Payer: Self-pay

## 2022-01-11 ENCOUNTER — Inpatient Hospital Stay: Payer: Medicaid Other

## 2022-01-11 ENCOUNTER — Inpatient Hospital Stay: Payer: Medicaid Other | Admitting: Oncology

## 2022-01-11 DIAGNOSIS — D71 Functional disorders of polymorphonuclear neutrophils: Secondary | ICD-10-CM | POA: Diagnosis not present

## 2022-01-11 DIAGNOSIS — D72828 Other elevated white blood cell count: Secondary | ICD-10-CM

## 2022-01-11 DIAGNOSIS — F17200 Nicotine dependence, unspecified, uncomplicated: Secondary | ICD-10-CM

## 2022-01-11 LAB — CBC WITH DIFFERENTIAL (CANCER CENTER ONLY)
Abs Immature Granulocytes: 0.08 10*3/uL — ABNORMAL HIGH (ref 0.00–0.07)
Basophils Absolute: 0.1 10*3/uL (ref 0.0–0.1)
Basophils Relative: 1 %
Eosinophils Absolute: 0.4 10*3/uL (ref 0.0–0.5)
Eosinophils Relative: 4 %
HCT: 39.7 % (ref 36.0–46.0)
Hemoglobin: 13.1 g/dL (ref 12.0–15.0)
Immature Granulocytes: 1 %
Lymphocytes Relative: 20 %
Lymphs Abs: 2.1 10*3/uL (ref 0.7–4.0)
MCH: 27.1 pg (ref 26.0–34.0)
MCHC: 33 g/dL (ref 30.0–36.0)
MCV: 82.2 fL (ref 80.0–100.0)
Monocytes Absolute: 0.6 10*3/uL (ref 0.1–1.0)
Monocytes Relative: 6 %
Neutro Abs: 7.4 10*3/uL (ref 1.7–7.7)
Neutrophils Relative %: 68 %
Platelet Count: 428 10*3/uL — ABNORMAL HIGH (ref 150–400)
RBC: 4.83 MIL/uL (ref 3.87–5.11)
RDW: 14.3 % (ref 11.5–15.5)
WBC Count: 10.7 10*3/uL — ABNORMAL HIGH (ref 4.0–10.5)
nRBC: 0 % (ref 0.0–0.2)

## 2022-01-11 LAB — CMP (CANCER CENTER ONLY)
ALT: 32 U/L (ref 0–44)
AST: 21 U/L (ref 15–41)
Albumin: 4.3 g/dL (ref 3.5–5.0)
Alkaline Phosphatase: 83 U/L (ref 38–126)
Anion gap: 3 — ABNORMAL LOW (ref 5–15)
BUN: 9 mg/dL (ref 6–20)
CO2: 30 mmol/L (ref 22–32)
Calcium: 9.5 mg/dL (ref 8.9–10.3)
Chloride: 102 mmol/L (ref 98–111)
Creatinine: 0.61 mg/dL (ref 0.44–1.00)
GFR, Estimated: >60 mL/min (ref >60)
Glucose, Bld: 87 mg/dL (ref 70–99)
Potassium: 4.3 mmol/L (ref 3.5–5.1)
Sodium: 135 mmol/L (ref 135–145)
Total Bilirubin: 0.4 mg/dL (ref 0.3–1.2)
Total Protein: 7.2 g/dL (ref 6.5–8.1)

## 2022-01-11 LAB — URINALYSIS, COMPLETE (UACMP) WITH MICROSCOPIC
Bilirubin Urine: NEGATIVE
Glucose, UA: NEGATIVE mg/dL
Ketones, ur: NEGATIVE mg/dL
Leukocytes,Ua: NEGATIVE
Nitrite: NEGATIVE
Protein, ur: NEGATIVE mg/dL
Specific Gravity, Urine: 1.002 — ABNORMAL LOW (ref 1.005–1.030)
pH: 7 (ref 5.0–8.0)

## 2022-01-11 LAB — C-REACTIVE PROTEIN: CRP: 1.5 mg/dL — ABNORMAL HIGH (ref <1.0)

## 2022-01-11 LAB — CORTISOL: Cortisol, Plasma: 8.1 ug/dL

## 2022-01-11 LAB — SEDIMENTATION RATE: Sed Rate: 11 mm/hr (ref 0–22)

## 2022-01-11 LAB — PREGNANCY, URINE: Preg Test, Ur: NEGATIVE

## 2022-01-17 LAB — JAK2 (INCLUDING V617F AND EXON 12), MPL,& CALR W/RFL MPN PANEL (NGS)

## 2022-06-03 ENCOUNTER — Ambulatory Visit: Payer: Medicaid Other | Attending: Nurse Practitioner | Admitting: Nurse Practitioner

## 2022-06-03 ENCOUNTER — Encounter: Payer: Self-pay | Admitting: Nurse Practitioner

## 2022-06-03 VITALS — BP 103/70 | HR 86 | Ht 59.0 in | Wt 153.4 lb

## 2022-06-03 DIAGNOSIS — K5909 Other constipation: Secondary | ICD-10-CM

## 2022-06-03 DIAGNOSIS — Z23 Encounter for immunization: Secondary | ICD-10-CM

## 2022-06-03 DIAGNOSIS — R1084 Generalized abdominal pain: Secondary | ICD-10-CM

## 2022-06-03 MED ORDER — DICYCLOMINE HCL 10 MG PO CAPS: 10.0000 mg | ORAL_CAPSULE | Freq: Three times a day (TID) | ORAL | 6 refills | Status: DC

## 2022-06-03 MED ORDER — SENNOSIDES-DOCUSATE SODIUM 8.6-50 MG PO TABS: 2.0000 | ORAL_TABLET | Freq: Every day | ORAL | 6 refills | Status: DC

## 2022-06-03 NOTE — Progress Notes (Signed)
Assessment & Plan:  Kelsey Davies was seen today for abdominal pain.  Diagnoses and all orders for this visit:  Generalized abdominal pain -     dicyclomine (BENTYL) 10 MG capsule; Take 1 capsule (10 mg total) by mouth 4 (four) times daily -  before meals and at bedtime. -     Urinalysis, Complete -     CMP14+EGFR -     H. pylori breath test -     senna-docusate (SENOKOT-S) 8.6-50 MG tablet; Take 2 tablets by mouth daily. -     CBC with Differential  Chronic constipation -     senna-docusate (SENOKOT-S) 8.6-50 MG tablet; Take 2 tablets by mouth daily.    Patient has been counseled on age-appropriate routine health concerns for screening and prevention. These are reviewed and up-to-date. Referrals have been placed accordingly. Immunizations are up-to-date or declined.    Subjective:   Chief Complaint  Patient presents with  . Abdominal Pain   HPI Kelsey Davies 32 y.o. female presents to office today with concerns of persistent abdominal pain  VRI was used to communicate directly with patient for the entire encounter including providing detailed patient instructions.     Abdominal Pain Chronic with normal imaging aside from mild fatty liver. GI evaluated and work up negative. Instructed to try dicyclomine which she states today she can not remember if she took this or not. pain Located on both sides of her abdomen. States it feels hard on her sides however upon my exam no palpable masses or hardened areas as she describes.  CT of abdomen and CT renal stone negative (2023).  Pain is worse when she gets up to a standing position from sitting which sounds more musculoskeletal than GI related.  Pain lasts for hour to hour and half  She does endorse chronic constipation. Only experiencing a bowel movement every 3 days.     Review of Systems  Constitutional:  Negative for fever, malaise/fatigue and weight loss.  HENT: Negative.  Negative for nosebleeds.   Eyes: Negative.  Negative  for blurred vision, double vision and photophobia.  Respiratory: Negative.  Negative for cough and shortness of breath.   Cardiovascular: Negative.  Negative for chest pain, palpitations and leg swelling.  Gastrointestinal:  Positive for abdominal pain and constipation. Negative for blood in stool, diarrhea, heartburn, melena, nausea and vomiting.  Musculoskeletal: Negative.  Negative for myalgias.  Neurological: Negative.  Negative for dizziness, focal weakness, seizures and headaches.  Psychiatric/Behavioral: Negative.  Negative for suicidal ideas.     Past Medical History:  Diagnosis Date  . Medical history non-contributory     Past Surgical History:  Procedure Laterality Date  . NO PAST SURGERIES      Family History  Problem Relation Age of Onset  . Healthy Mother   . Rheum arthritis Maternal Grandmother   . Rheum arthritis Maternal Grandfather   . Asthma Paternal Grandmother   . Rheum arthritis Sister   . Rheum arthritis Brother   . Hearing loss Neg Hx   . Colon cancer Neg Hx   . Stomach cancer Neg Hx   . Pancreatic cancer Neg Hx     Social History Reviewed with no changes to be made today.   Outpatient Medications Prior to Visit  Medication Sig Dispense Refill  . Misc. Devices MISC Please supply with size M adult diapers, gloves and wipes. ICD 10 N39.3 1 each 0  . meloxicam (MOBIC) 7.5 MG tablet Take 1 tablet (7.5 mg total) by  mouth daily. For knee pain. Take with food (Patient not taking: Reported on 11/30/2021) 30 tablet 0  . nicotine polacrilex (NICORETTE) 4 MG gum Take 1 each (4 mg total) by mouth as needed for smoking cessation. (Patient not taking: Reported on 06/03/2022) 100 tablet 3   No facility-administered medications prior to visit.    No Known Allergies     Objective:    BP 103/70   Pulse 86   Ht 4' 11"$  (1.499 m)   Wt 153 lb 6.4 oz (69.6 kg)   LMP 05/12/2022   SpO2 97%   BMI 30.98 kg/m  Wt Readings from Last 3 Encounters:  06/03/22 153 lb 6.4  oz (69.6 kg)  12/21/21 152 lb 14.4 oz (69.4 kg)  11/30/21 150 lb (68 kg)    Physical Exam Vitals and nursing note reviewed.  Constitutional:      Appearance: She is well-developed.  HENT:     Head: Normocephalic and atraumatic.  Cardiovascular:     Rate and Rhythm: Normal rate and regular rhythm.     Heart sounds: Normal heart sounds. No murmur heard.    No friction rub. No gallop.  Pulmonary:     Effort: Pulmonary effort is normal. No tachypnea or respiratory distress.     Breath sounds: Normal breath sounds. No decreased breath sounds, wheezing, rhonchi or rales.  Chest:     Chest wall: No tenderness.  Abdominal:     General: Bowel sounds are normal.     Palpations: Abdomen is soft.     Tenderness: There is abdominal tenderness.    Musculoskeletal:        General: Normal range of motion.     Cervical back: Normal range of motion.  Skin:    General: Skin is warm and dry.  Neurological:     Mental Status: She is alert and oriented to person, place, and time.     Coordination: Coordination normal.  Psychiatric:        Behavior: Behavior normal. Behavior is cooperative.        Thought Content: Thought content normal.        Judgment: Judgment normal.         Patient has been counseled extensively about nutrition and exercise as well as the importance of adherence with medications and regular follow-up. The patient was given clear instructions to go to ER or return to medical center if symptoms don't improve, worsen or new problems develop. The patient verbalized understanding.   Follow-up: Return in about 2 months (around 08/02/2022) for abdominal pain.   Gildardo Pounds, FNP-BC [facility], [place]   06/03/2022, 2:02 PM

## 2022-06-04 ENCOUNTER — Ambulatory Visit: Payer: Medicaid Other | Attending: Family Medicine

## 2022-06-04 LAB — URINALYSIS, COMPLETE
Bilirubin, UA: NEGATIVE
Glucose, UA: NEGATIVE
Ketones, UA: NEGATIVE
Nitrite, UA: NEGATIVE
Protein,UA: NEGATIVE
RBC, UA: NEGATIVE
Specific Gravity, UA: 1.009 (ref 1.005–1.030)
Urobilinogen, Ur: 0.2 mg/dL (ref 0.2–1.0)
pH, UA: 8.5 — ABNORMAL HIGH (ref 5.0–7.5)

## 2022-06-04 LAB — MICROSCOPIC EXAMINATION
Bacteria, UA: NONE SEEN
Casts: NONE SEEN /lpf
RBC, Urine: NONE SEEN /hpf (ref 0–2)

## 2022-06-05 LAB — CBC WITH DIFFERENTIAL/PLATELET
Basophils Absolute: 0.1 10*3/uL (ref 0.0–0.2)
Basos: 1 %
EOS (ABSOLUTE): 0.7 10*3/uL — ABNORMAL HIGH (ref 0.0–0.4)
Eos: 7 %
Hematocrit: 41 % (ref 34.0–46.6)
Hemoglobin: 13.4 g/dL (ref 11.1–15.9)
Immature Grans (Abs): 0.1 10*3/uL (ref 0.0–0.1)
Immature Granulocytes: 1 %
Lymphocytes Absolute: 3.2 10*3/uL — ABNORMAL HIGH (ref 0.7–3.1)
Lymphs: 33 %
MCH: 26.7 pg (ref 26.6–33.0)
MCHC: 32.7 g/dL (ref 31.5–35.7)
MCV: 82 fL (ref 79–97)
Monocytes Absolute: 0.5 10*3/uL (ref 0.1–0.9)
Monocytes: 5 %
Neutrophils Absolute: 5.2 10*3/uL (ref 1.4–7.0)
Neutrophils: 53 %
Platelets: 509 10*3/uL — ABNORMAL HIGH (ref 150–450)
RBC: 5.02 x10E6/uL (ref 3.77–5.28)
RDW: 13.4 % (ref 11.7–15.4)
WBC: 9.8 10*3/uL (ref 3.4–10.8)

## 2022-06-05 LAB — CMP14+EGFR
ALT: 37 IU/L — ABNORMAL HIGH (ref 0–32)
AST: 19 IU/L (ref 0–40)
Albumin/Globulin Ratio: 1.7 (ref 1.2–2.2)
Albumin: 4.3 g/dL (ref 4.0–5.0)
Alkaline Phosphatase: 115 IU/L (ref 44–121)
BUN/Creatinine Ratio: 15 (ref 9–23)
BUN: 10 mg/dL (ref 6–20)
Bilirubin Total: 0.2 mg/dL (ref 0.0–1.2)
CO2: 23 mmol/L (ref 20–29)
Calcium: 9.5 mg/dL (ref 8.7–10.2)
Chloride: 102 mmol/L (ref 96–106)
Creatinine, Ser: 0.67 mg/dL (ref 0.57–1.00)
Globulin, Total: 2.5 g/dL (ref 1.5–4.5)
Glucose: 89 mg/dL (ref 70–99)
Potassium: 4.5 mmol/L (ref 3.5–5.2)
Sodium: 141 mmol/L (ref 134–144)
Total Protein: 6.8 g/dL (ref 6.0–8.5)
eGFR: 121 mL/min/{1.73_m2} (ref >59)

## 2022-06-06 LAB — H. PYLORI BREATH TEST: H pylori Breath Test: NEGATIVE

## 2022-06-09 ENCOUNTER — Other Ambulatory Visit: Payer: Self-pay | Admitting: Nurse Practitioner

## 2022-06-09 DIAGNOSIS — R7989 Other specified abnormal findings of blood chemistry: Secondary | ICD-10-CM

## 2022-06-21 ENCOUNTER — Other Ambulatory Visit: Payer: Medicaid Other

## 2022-06-24 ENCOUNTER — Ambulatory Visit: Payer: Medicaid Other | Attending: Nurse Practitioner

## 2022-06-25 LAB — CBC WITH DIFFERENTIAL/PLATELET
Basophils Absolute: 0.1 10*3/uL (ref 0.0–0.2)
Basos: 1 %
EOS (ABSOLUTE): 0.5 10*3/uL — ABNORMAL HIGH (ref 0.0–0.4)
Eos: 5 %
Hematocrit: 37.1 % (ref 34.0–46.6)
Hemoglobin: 11.9 g/dL (ref 11.1–15.9)
Immature Grans (Abs): 0 10*3/uL (ref 0.0–0.1)
Immature Granulocytes: 0 %
Lymphocytes Absolute: 3.4 10*3/uL — ABNORMAL HIGH (ref 0.7–3.1)
Lymphs: 29 %
MCH: 26.6 pg (ref 26.6–33.0)
MCHC: 32.1 g/dL (ref 31.5–35.7)
MCV: 83 fL (ref 79–97)
Monocytes Absolute: 0.8 10*3/uL (ref 0.1–0.9)
Monocytes: 7 %
Neutrophils Absolute: 6.8 10*3/uL (ref 1.4–7.0)
Neutrophils: 58 %
Platelets: 400 10*3/uL (ref 150–450)
RBC: 4.47 x10E6/uL (ref 3.77–5.28)
RDW: 13.5 % (ref 11.7–15.4)
WBC: 11.7 10*3/uL — ABNORMAL HIGH (ref 3.4–10.8)

## 2022-08-06 ENCOUNTER — Encounter: Payer: Self-pay | Admitting: Nurse Practitioner

## 2022-08-06 ENCOUNTER — Ambulatory Visit: Payer: Medicaid Other | Attending: Nurse Practitioner | Admitting: Nurse Practitioner

## 2022-08-06 VITALS — BP 102/71 | HR 92 | Ht 59.0 in | Wt 153.2 lb

## 2022-08-06 DIAGNOSIS — R1084 Generalized abdominal pain: Secondary | ICD-10-CM

## 2022-08-06 DIAGNOSIS — N939 Abnormal uterine and vaginal bleeding, unspecified: Secondary | ICD-10-CM | POA: Diagnosis not present

## 2022-08-06 DIAGNOSIS — L659 Nonscarring hair loss, unspecified: Secondary | ICD-10-CM | POA: Diagnosis not present

## 2022-08-06 NOTE — Progress Notes (Signed)
4'11 Patient states she is still experiencing left and right  sided abdominal pain.

## 2022-08-06 NOTE — Progress Notes (Signed)
Assessment & Plan:  Kelsey Davies was seen today for abdominal pain.  Diagnoses and all orders for this visit:  Generalized abdominal pain -     Ambulatory referral to Gastroenterology  Hair thinning -     VITAMIN D 25 Hydroxy (Vit-D Deficiency, Fractures) -     Thyroid Panel With TSH  Abnormal uterine bleeding (AUB) -     Beta hCG quant (ref lab)    Patient has been counseled on age-appropriate routine health concerns for screening and prevention. These are reviewed and up-to-date. Referrals have been placed accordingly. Immunizations are up-to-date or declined.    Subjective:   Chief Complaint  Patient presents with   Abdominal Pain   Abdominal Pain Associated symptoms include constipation. Pertinent negatives include no fever, headaches, myalgias, nausea, vomiting or weight loss.   Kelsey Davies 32 y.o. female presents to office today for follow up to abdominal pain.  VRI was used to communicate directly with patient for the entire encounter including providing detailed patient instructions.       Abdominal Pain Chronic with normal imaging aside from mild fatty liver. GI evaluated and work up negative. Recommended dicyclomine at that time. Today she reports not much improvement in pain despite taking dicyclomine as prescribed. Pain L>R. Located on both sides of her abdomen. States it feels hard on her sides however upon my exam no palpable masses or hardened areas as she describes.  CT of abdomen and CT renal stone negative (2023).  Pain lasts for hour to hour and half. Constipation has improved. 2 times per day. Aggravated after eating and drinking. Pain described as stiffness or tightness.    She also endorses hair thinning. Last thyroid level was normal.   LMP 06-13-2022  Review of Systems  Constitutional:  Negative for fever, malaise/fatigue and weight loss.  HENT: Negative.  Negative for nosebleeds.   Eyes: Negative.  Negative for blurred vision, double vision and  photophobia.  Respiratory: Negative.  Negative for cough and shortness of breath.   Cardiovascular: Negative.  Negative for chest pain, palpitations and leg swelling.  Gastrointestinal:  Positive for abdominal pain and constipation. Negative for heartburn, nausea and vomiting.  Musculoskeletal: Negative.  Negative for myalgias.  Neurological: Negative.  Negative for dizziness, focal weakness, seizures and headaches.  Psychiatric/Behavioral: Negative.  Negative for suicidal ideas.     Past Medical History:  Diagnosis Date   Medical history non-contributory     Past Surgical History:  Procedure Laterality Date   NO PAST SURGERIES      Family History  Problem Relation Age of Onset   Healthy Mother    Rheum arthritis Maternal Grandmother    Rheum arthritis Maternal Grandfather    Asthma Paternal Grandmother    Rheum arthritis Sister    Rheum arthritis Brother    Hearing loss Neg Hx    Colon cancer Neg Hx    Stomach cancer Neg Hx    Pancreatic cancer Neg Hx     Social History Reviewed with no changes to be made today.   Outpatient Medications Prior to Visit  Medication Sig Dispense Refill   dicyclomine (BENTYL) 10 MG capsule Take 1 capsule (10 mg total) by mouth 4 (four) times daily -  before meals and at bedtime. 60 capsule 6   Misc. Devices MISC Please supply with size M adult diapers, gloves and wipes. ICD 10 N39.3 1 each 0   senna-docusate (SENOKOT-S) 8.6-50 MG tablet Take 2 tablets by mouth daily. 60 tablet 6  No facility-administered medications prior to visit.    No Known Allergies     Objective:    BP 102/71   Pulse 92   Ht  (1.499 m)   Wt 153 lb 3.2 oz (69.5 kg)   LMP 06/13/2022 (Exact Date)   SpO2 99%   BMI 30.94 kg/m  Wt Readings from Last 3 Encounters:  08/06/22 153 lb 3.2 oz (69.5 kg)  06/03/22 153 lb 6.4 oz (69.6 kg)  12/21/21 152 lb 14.4 oz (69.4 kg)    Physical Exam Vitals and nursing note reviewed.  Constitutional:      Appearance:  She is well-developed.  HENT:     Head: Normocephalic and atraumatic.  Cardiovascular:     Rate and Rhythm: Normal rate and regular rhythm.     Heart sounds: Normal heart sounds. No murmur heard.    No friction rub. No gallop.  Pulmonary:     Effort: Pulmonary effort is normal. No tachypnea or respiratory distress.     Breath sounds: Normal breath sounds. No decreased breath sounds, wheezing, rhonchi or rales.  Chest:     Chest wall: No tenderness.  Abdominal:     General: Bowel sounds are normal.     Palpations: Abdomen is soft.  Musculoskeletal:        General: Normal range of motion.     Cervical back: Normal range of motion.  Skin:    General: Skin is warm and dry.  Neurological:     Mental Status: She is alert and oriented to person, place, and time.     Coordination: Coordination normal.  Psychiatric:        Behavior: Behavior normal. Behavior is cooperative.        Thought Content: Thought content normal.        Judgment: Judgment normal.          Patient has been counseled extensively about nutrition and exercise as well as the importance of adherence with medications and regular follow-up. The patient was given clear instructions to go to ER or return to medical center if symptoms don't improve, worsen or new problems develop. The patient verbalized understanding.   Follow-up: Return if symptoms worsen or fail to improve.   Claiborne Rigg, FNP-BC [facility] [place], [place] [phone]   08/06/2022, 1:56 PM

## 2022-08-07 ENCOUNTER — Other Ambulatory Visit: Payer: Self-pay | Admitting: Nurse Practitioner

## 2022-08-07 LAB — THYROID PANEL WITH TSH
Free Thyroxine Index: 1.7 (ref 1.2–4.9)
T3 Uptake Ratio: 23 % — ABNORMAL LOW (ref 24–39)
T4, Total: 7.6 ug/dL (ref 4.5–12.0)
TSH: 2.7 u[IU]/mL (ref 0.450–4.500)

## 2022-08-07 LAB — VITAMIN D 25 HYDROXY (VIT D DEFICIENCY, FRACTURES): Vit D, 25-Hydroxy: 11.8 ng/mL — ABNORMAL LOW (ref 30.0–100.0)

## 2022-08-07 LAB — BETA HCG QUANT (REF LAB): hCG Quant: <1 m[IU]/mL

## 2022-08-07 MED ORDER — VITAMIN D (ERGOCALCIFEROL) 1.25 MG (50000 UNIT) PO CAPS: 50000.0000 [IU] | ORAL_CAPSULE | ORAL | 0 refills | Status: DC

## 2022-09-29 ENCOUNTER — Ambulatory Visit (HOSPITAL_COMMUNITY)
Admission: EM | Admit: 2022-09-29 | Discharge: 2022-09-29 | Disposition: A | Discharge status: Home / Self Care | Payer: Medicaid Other | Attending: Emergency Medicine | Admitting: Emergency Medicine

## 2022-09-29 ENCOUNTER — Emergency Department (HOSPITAL_COMMUNITY): Payer: Medicaid Other

## 2022-09-29 ENCOUNTER — Other Ambulatory Visit: Payer: Self-pay

## 2022-09-29 ENCOUNTER — Encounter (HOSPITAL_COMMUNITY): Payer: Self-pay | Admitting: Radiology

## 2022-09-29 ENCOUNTER — Encounter (HOSPITAL_COMMUNITY): Payer: Self-pay

## 2022-09-29 ENCOUNTER — Emergency Department (HOSPITAL_COMMUNITY)
Admission: EM | Admit: 2022-09-29 | Discharge: 2022-09-30 | Disposition: A | Discharge status: Home / Self Care | Payer: Medicaid Other | Attending: Emergency Medicine | Admitting: Emergency Medicine

## 2022-09-29 DIAGNOSIS — D72829 Elevated white blood cell count, unspecified: Secondary | ICD-10-CM | POA: Diagnosis not present

## 2022-09-29 DIAGNOSIS — R112 Nausea with vomiting, unspecified: Secondary | ICD-10-CM | POA: Diagnosis not present

## 2022-09-29 DIAGNOSIS — R1031 Right lower quadrant pain: Secondary | ICD-10-CM | POA: Diagnosis present

## 2022-09-29 DIAGNOSIS — N898 Other specified noninflammatory disorders of vagina: Secondary | ICD-10-CM | POA: Insufficient documentation

## 2022-09-29 DIAGNOSIS — I880 Nonspecific mesenteric lymphadenitis: Secondary | ICD-10-CM | POA: Diagnosis not present

## 2022-09-29 DIAGNOSIS — R1084 Generalized abdominal pain: Secondary | ICD-10-CM | POA: Diagnosis not present

## 2022-09-29 DIAGNOSIS — R7401 Elevation of levels of liver transaminase levels: Secondary | ICD-10-CM | POA: Insufficient documentation

## 2022-09-29 DIAGNOSIS — R1030 Lower abdominal pain, unspecified: Secondary | ICD-10-CM

## 2022-09-29 LAB — CBC
HCT: 39.3 % (ref 36.0–46.0)
Hemoglobin: 12.3 g/dL (ref 12.0–15.0)
MCH: 26.3 pg (ref 26.0–34.0)
MCHC: 31.3 g/dL (ref 30.0–36.0)
MCV: 84.2 fL (ref 80.0–100.0)
Platelets: 466 10*3/uL — ABNORMAL HIGH (ref 150–400)
RBC: 4.67 MIL/uL (ref 3.87–5.11)
RDW: 13.9 % (ref 11.5–15.5)
WBC: 11.4 10*3/uL — ABNORMAL HIGH (ref 4.0–10.5)
nRBC: 0 % (ref 0.0–0.2)

## 2022-09-29 LAB — URINALYSIS, ROUTINE W REFLEX MICROSCOPIC
Bilirubin Urine: NEGATIVE
Glucose, UA: NEGATIVE mg/dL
Hgb urine dipstick: NEGATIVE
Ketones, ur: NEGATIVE mg/dL
Nitrite: NEGATIVE
Protein, ur: NEGATIVE mg/dL
Specific Gravity, Urine: 1.005 (ref 1.005–1.030)
pH: 6 (ref 5.0–8.0)

## 2022-09-29 LAB — COMPREHENSIVE METABOLIC PANEL
ALT: 66 U/L — ABNORMAL HIGH (ref 0–44)
AST: 35 U/L (ref 15–41)
Albumin: 3.9 g/dL (ref 3.5–5.0)
Alkaline Phosphatase: 80 U/L (ref 38–126)
Anion gap: 10 (ref 5–15)
BUN: 7 mg/dL (ref 6–20)
CO2: 24 mmol/L (ref 22–32)
Calcium: 8.7 mg/dL — ABNORMAL LOW (ref 8.9–10.3)
Chloride: 104 mmol/L (ref 98–111)
Creatinine, Ser: 0.61 mg/dL (ref 0.44–1.00)
GFR, Estimated: >60 mL/min (ref >60)
Glucose, Bld: 88 mg/dL (ref 70–99)
Potassium: 3.6 mmol/L (ref 3.5–5.1)
Sodium: 138 mmol/L (ref 135–145)
Total Bilirubin: 0.3 mg/dL (ref 0.3–1.2)
Total Protein: 7 g/dL (ref 6.5–8.1)

## 2022-09-29 LAB — WET PREP, GENITAL
Clue Cells Wet Prep HPF POC: NONE SEEN
Sperm: NONE SEEN
Trich, Wet Prep: NONE SEEN
WBC, Wet Prep HPF POC: <10 (ref <10)
Yeast Wet Prep HPF POC: NONE SEEN

## 2022-09-29 LAB — LIPASE, BLOOD: Lipase: 26 U/L (ref 11–51)

## 2022-09-29 LAB — I-STAT BETA HCG BLOOD, ED (MC, WL, AP ONLY): I-stat hCG, quantitative: 12.2 m[IU]/mL — ABNORMAL HIGH (ref <5)

## 2022-09-29 LAB — HCG, QUANTITATIVE, PREGNANCY: hCG, Beta Chain, Quant, S: 1 m[IU]/mL (ref <5)

## 2022-09-29 MED ORDER — ONDANSETRON 4 MG PO TBDP
4.0000 mg | ORAL_TABLET | Freq: Once | ORAL | Status: AC
  Administered 2022-09-29: 4 mg via ORAL

## 2022-09-29 MED ORDER — MORPHINE SULFATE (PF) 4 MG/ML IV SOLN
4.0000 mg | Freq: Once | INTRAVENOUS | Status: AC
  Administered 2022-09-29: 4 mg via INTRAVENOUS
  Filled 2022-09-29: qty 1

## 2022-09-29 MED ORDER — METRONIDAZOLE 500 MG PO TABS
500.0000 mg | ORAL_TABLET | Freq: Once | ORAL | Status: AC
  Administered 2022-09-29: 500 mg via ORAL
  Filled 2022-09-29: qty 1

## 2022-09-29 MED ORDER — METRONIDAZOLE 500 MG PO TABS: 500.0000 mg | ORAL_TABLET | Freq: Two times a day (BID) | ORAL | 0 refills | Status: DC

## 2022-09-29 MED ORDER — DOXYCYCLINE HYCLATE 100 MG PO TABS
100.0000 mg | ORAL_TABLET | Freq: Once | ORAL | Status: AC
  Administered 2022-09-29: 100 mg via ORAL
  Filled 2022-09-29: qty 1

## 2022-09-29 MED ORDER — IOHEXOL 300 MG/ML  SOLN
100.0000 mL | Freq: Once | INTRAMUSCULAR | Status: AC | PRN
  Administered 2022-09-29: 100 mL via INTRAVENOUS

## 2022-09-29 MED ORDER — ONDANSETRON 4 MG PO TBDP
ORAL_TABLET | ORAL | Status: AC
  Filled 2022-09-29: qty 1

## 2022-09-29 MED ORDER — DOXYCYCLINE HYCLATE 100 MG PO CAPS: 100.0000 mg | ORAL_CAPSULE | Freq: Two times a day (BID) | ORAL | 0 refills | Status: DC

## 2022-09-29 MED ORDER — SODIUM CHLORIDE (PF) 0.9 % IJ SOLN
INTRAMUSCULAR | Status: AC
  Filled 2022-09-29: qty 50

## 2022-09-29 MED ORDER — CEFTRIAXONE SODIUM 1 G IJ SOLR
500.0000 mg | Freq: Once | INTRAMUSCULAR | Status: AC
  Administered 2022-09-29: 500 mg via INTRAMUSCULAR
  Filled 2022-09-29: qty 10

## 2022-09-29 MED ORDER — ONDANSETRON HCL 4 MG/2ML IJ SOLN
4.0000 mg | Freq: Once | INTRAMUSCULAR | Status: AC
  Administered 2022-09-29: 4 mg via INTRAVENOUS
  Filled 2022-09-29: qty 2

## 2022-09-29 NOTE — ED Provider Notes (Signed)
[place] EMERGENCY DEPARTMENT AT [facility] Provider Note   CSN: [id number] Arrival date & time: 09/29/22  1651     History {Add pertinent medical, surgical, social history, OB history to HPI:1} Chief Complaint  Patient presents with   Abdominal Pain   Emesis    Kelsey Davies is a 32 y.o. female with h/o Low back pain/scoliosis who presents with RLQ/LLQ abd pain, N/V for 5x days. Accompanied by sister who provides additional history.  No know sick contacts. She is G2P2. H/o cholecystectomy in 2020. Pain is constant, 9/10. Sometimes has had similar pain in the past and was referred to GI by her PCP but hasn't gone to GI yet. No f/c, CP, SOB, cough, flu-like symptoms, or urinary symptoms. +Vaginal discharge and itching as well. No recent unprotected sex or new sexual partners.     Abdominal Pain Associated symptoms: vomiting   Emesis Associated symptoms: abdominal pain        Home Medications Prior to Admission medications   Medication Sig Start Date End Date Taking? Authorizing Provider  dicyclomine (BENTYL) 10 MG capsule Take 1 capsule (10 mg total) by mouth 4 (four) times daily -  before meals and at bedtime. 06/03/22   Claiborne Rigg, NP  Misc. Devices MISC Please supply with size M adult diapers, gloves and wipes. ICD 10 N39.3 09/18/21   Claiborne Rigg, NP  senna-docusate (SENOKOT-S) 8.6-50 MG tablet Take 2 tablets by mouth daily. 06/03/22   Claiborne Rigg, NP  Vitamin D, Ergocalciferol, (DRISDOL) 1.25 MG (50000 UNIT) CAPS capsule Take 1 capsule (50,000 Units total) by mouth every 7 (seven) days. Please fill as a 90 day supply 08/07/22   Claiborne Rigg, NP      Allergies    Patient has no known allergies.    Review of Systems   Review of Systems  Gastrointestinal:  Positive for abdominal pain and vomiting.   Review of systems Negative for f/c.  A 10 point review of systems was performed and is negative unless otherwise reported in HPI.  Physical  Exam Updated Vital Signs BP 105/76 (BP Location: Left Arm)   Pulse 81   Temp 97.9 F (36.6 C) (Oral)   Resp 18   Ht 5\' 1"  (1.549 m)   Wt 68 kg   LMP 09/10/2022 (Exact Date)   SpO2 100%   BMI 28.33 kg/m  Physical Exam General: Normal appearing female, lying in bed.  HEENT: Sclera anicteric, MMM, trachea midline.  Cardiology: RRR, no murmurs/rubs/gallops. BL radial and DP pulses equal bilaterally.  Resp: Normal respiratory rate and effort. CTAB, no wheezes, rhonchi, crackles.  Abd: TTP in BL LQs and suprapubic region. Soft, non-distended. No rebound tenderness or guarding.  Pelvic: *** MSK: No peripheral edema or signs of trauma. Extremities without deformity or TTP. No cyanosis or clubbing. Skin: warm, dry.  Back: +bilateral CVA tenderness Neuro: A&Ox4, CNs II-XII grossly intact. MAEs. Sensation grossly intact.  Psych: Normal mood and affect.   ED Results / Procedures / Treatments   Labs (all labs ordered are listed, but only abnormal results are displayed) Labs Reviewed  COMPREHENSIVE METABOLIC PANEL - Abnormal; Notable for the following components:      Result Value   Calcium 8.7 (*)    ALT 66 (*)    All other components within normal limits  CBC - Abnormal; Notable for the following components:   WBC 11.4 (*)    Platelets 466 (*)    All other components within  normal limits  URINALYSIS, ROUTINE W REFLEX MICROSCOPIC - Abnormal; Notable for the following components:   Color, Urine STRAW (*)    Leukocytes,Ua TRACE (*)    Bacteria, UA RARE (*)    All other components within normal limits  I-STAT BETA HCG BLOOD, ED ([facility], [facility], [facility] ONLY) - Abnormal; Notable for the following components:   I-stat hCG, quantitative 12.2 (*)    All other components within normal limits  WET PREP, GENITAL  LIPASE, BLOOD  GC/CHLAMYDIA PROBE AMP ([place]) NOT AT [facility]    EKG None  Radiology No results found.  Procedures Procedures  {Document cardiac monitor, telemetry assessment  procedure when appropriate:1}  Medications Ordered in ED Medications  morphine (PF) 4 MG/ML injection 4 mg (has no administration in time range)  ondansetron (ZOFRAN) injection 4 mg (has no administration in time range)    ED Course/ Medical Decision Making/ A&P                          Medical Decision Making Amount and/or Complexity of Data Reviewed Labs: ordered. Decision-making details documented in ED Course. Radiology: ordered.    This patient presents to the ED for concern of abd pain, N/V; this involves an extensive number of treatment options, and is a complaint that carries with it a high risk of complications and morbidity.  I considered the following differential and admission for this acute, potentially life threatening condition.   MDM:    For DDX for abdominal pain includes but is not limited to:  Abdominal exam without peritoneal signs. No evidence of acute abdomen at this time.   Low suspicion for acute hepatobiliary disease (including acute cholecystitis or cholangitis) d/t h/o cholecystectomy, acute pancreatitis (neg lipase), PUD (including gastric perforation) w/ no LUQ TTP. Consider acute appendicitis, vascular catastrophe, bowel obstruction, ovarian torsion, diverticulitis. For her vaginal discharge and abdominal pain must consider PID or TOA, vulvovaginal candidiasis, vaginitis. No recent pregnancies or gynecologic procedures, lower c/f endometritis.    Clinical Course as of 09/29/22 1828  Wynelle Link Sep 29, 2022  1804 WBC(!): 11.4 +leukocytosis [HN]  1804 Urinalysis, Routine w reflex microscopic -Urine, Clean Catch(!) No UTI [HN]  1823 Lipase: 26 [HN]  1823 Comprehensive metabolic panel(!) Mild elevation in ALT to 66 which she has had before. Reportedly has liver cirrhosis but doesn't know why.  [HN]    Clinical Course User Index [HN] Loetta Rough, MD    Labs: I Ordered, and personally interpreted labs.  The pertinent results include:  those listed  above  Imaging Studies ordered: I ordered imaging studies including CT abd pelvis I independently visualized and interpreted imaging. I agree with the radiologist interpretation  Additional history obtained from sister at bedside, chart review.    Reevaluation: After the interventions noted above, I reevaluated the patient and found that they have :{resolved/improved/worsened:23923::"improved"}  Social Determinants of Health: Patient lives independently   Disposition:  ***  Co morbidities that complicate the patient evaluation  Past Medical History:  Diagnosis Date   Medical history non-contributory      Medicines Meds ordered this encounter  Medications   morphine (PF) 4 MG/ML injection 4 mg   ondansetron (ZOFRAN) injection 4 mg    I have reviewed the patients home medicines and have made adjustments as needed  Problem List / ED Course: Problem List Items Addressed This Visit   None        {Document critical care time when appropriate:1} {  Document review of labs and clinical decision tools ie heart score, Chads2Vasc2 etc:1}  {Document your independent review of radiology images, and any outside records:1} {Document your discussion with family members, caretakers, and with consultants:1} {Document social determinants of health affecting pt's care:1} {Document your decision making why or why not admission, treatments were needed:1}  This note was created using dictation software, which may contain spelling or grammatical errors.

## 2022-09-29 NOTE — ED Triage Notes (Signed)
Pt c/o L sided abd pain, N/V for 5x days. No know sick contacts. AOx4

## 2022-09-29 NOTE — Discharge Instructions (Signed)
Thank you for coming to [facility] Emergency Department. You were seen for abdominal pain, nausea vomiting, and vaginal discharge. We did an exam, labs, and imaging, and these showed inflamed lymph nodes in your abdomen. You may have a virus causing your symptoms. However, your vaginal discharge and pelvic pain is concerning for pelvic inflammatory disease. We discussed with you and you preferred to start antibiotic treatment rather than watchfully wait. We treated you with one antibiotic in the ED and you will take doxycycline twice per day for 2 weeks and metronidazole twice per day for two weeks. Please follow up with your primary care physician or your gynecologist within 1-2 weeks.    Do not hesitate to return to the ED or call 911 if you experience: -Worsening symptoms -fevers/chills -Lightheadedness, passing out -Fevers/chills -Anything else that concerns you

## 2022-09-29 NOTE — ED Triage Notes (Signed)
Patient here today with c/o abd pain X 4-5 days. She has also had some constipation, N&V. She vomited more than 10 times over the past several days. She has not tried taking anything for the symptoms. No sick contacts. No recent travel.

## 2022-09-29 NOTE — ED Notes (Signed)
Patient is being discharged from the Urgent Care and sent to the Emergency Department via POV . Per Shaune Pascal NP, patient is in need of higher level of care due to Abdominal pain and emesis. Patient is aware and verbalizes understanding of plan of care.  Vitals:   09/29/22 1504  BP: 100/69  Pulse: 96  Resp: 16  Temp: 98.6 F (37 C)  SpO2: 96%

## 2022-09-29 NOTE — ED Notes (Signed)
Lab contacted in regards to GC/CZ swab. States it will be received in at [facility]. The physical swab was sent down to [facility] lab, in process at this time

## 2022-09-29 NOTE — ED Provider Notes (Signed)
[facility]    CSN: [id number] Arrival date & time: 09/29/22  1433      History   Chief Complaint Chief Complaint  Patient presents with   Abdominal Pain    HPI Kelsey Davies is a 32 y.o. female.   Patient presents to clinic for generalized abdominal pain, constipation, nausea and vomiting.  Abdominal pain has been ongoing for the past 5 days and worsening. Emesis has been ongoing as well, last emesis about an hour PTA. Tried to drink coffee today and vomited it back up, has not had anything else to eat or drink.   She denies fevers, or diarrhea.  Does report she is constipated, has had little to no oral intake the past 5 days.  Denies any sick contacts.  Has not taken any medications for symptoms.  Did have gallbladder surgery in 2020.  Has hx of chronic abdominal pain, but this pain is different and worsening.        The history is provided by the patient and medical records.    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 Authorizing Provider  Vitamin D, Ergocalciferol, (DRISDOL) 1.25 MG (50000 UNIT) CAPS capsule Take 1 capsule (50,000 Units total) by mouth every 7 (seven) days. Please fill as a 90 day supply  08/07/22  Yes Claiborne Rigg, NP  dicyclomine (BENTYL) 10 MG capsule Take 1 capsule (10 mg total) by mouth 4 (four) times daily -  before meals and at bedtime. 06/03/22   Claiborne Rigg, NP  Misc. Devices MISC Please supply with size M adult diapers, gloves and wipes. ICD 10 N39.3 09/18/21   Claiborne Rigg, NP  senna-docusate (SENOKOT-S) 8.6-50 MG tablet Take 2 tablets by mouth daily. 06/03/22   Claiborne Rigg, NP    Family History Family History  Problem Relation Age of Onset   Healthy Mother    Rheum arthritis Maternal Grandmother    Rheum arthritis Maternal Grandfather    Asthma Paternal Grandmother    Rheum arthritis Sister    Rheum arthritis Brother    Hearing loss Neg Hx    Colon cancer Neg Hx    Stomach cancer Neg Hx    Pancreatic cancer Neg Hx     Social History Social History   Tobacco Use   Smoking status: Never   Smokeless tobacco: Never  Vaping Use   Vaping Use: Never used  Substance Use Topics  Alcohol use: No   Drug use: No     Allergies   Patient has no known allergies.   Review of Systems Review of Systems  Constitutional:  Negative for fever.  HENT:  Negative for sore throat.   Respiratory:  Negative for cough.   Cardiovascular:  Negative for chest pain.  Gastrointestinal:  Positive for abdominal pain, constipation, nausea and vomiting. Negative for diarrhea.  Genitourinary:  Negative for dysuria.     Physical Exam Triage Vital Signs ED Triage Vitals  Enc Vitals Group     BP      Pulse      Resp      Temp      Temp src      SpO2      Weight      Height      Head Circumference      Peak Flow      Pain Score      Pain Loc      Pain Edu?      Excl. in GC?    No data found.  Updated Vital Signs BP 100/69 (BP Location: Right Arm)   Pulse 96   Temp 98.6 F (37 C) (Oral)   Resp 16   Ht 5\' 1"  (1.549 m)   Wt 150 lb (68 kg)   LMP 09/10/2022 (Exact Date)   SpO2 96%   BMI 28.34 kg/m   Visual Acuity Right Eye Distance:    Left Eye Distance:   Bilateral Distance:    Right Eye Near:   Left Eye Near:    Bilateral Near:     Physical Exam Vitals and nursing note reviewed.  Constitutional:      Appearance: She is well-developed.  HENT:     Head: Normocephalic and atraumatic.     Mouth/Throat:     Mouth: Mucous membranes are moist.  Eyes:     General: No scleral icterus. Cardiovascular:     Rate and Rhythm: Normal rate and regular rhythm.     Heart sounds: Normal heart sounds. No murmur heard. Pulmonary:     Effort: Pulmonary effort is normal. No respiratory distress.     Breath sounds: Normal breath sounds.  Abdominal:     General: Abdomen is flat. Bowel sounds are normal.     Palpations: Abdomen is soft.     Tenderness: There is abdominal tenderness in the epigastric area, left upper quadrant and left lower quadrant.     Hernia: No hernia is present.  Neurological:     Mental Status: She is alert.      UC Treatments / Results  Labs (all labs ordered are listed, but only abnormal results are displayed) Labs Reviewed - No data to display  EKG   Radiology No results found.  Procedures Procedures (including critical care time)  Medications Ordered in UC Medications  ondansetron (ZOFRAN-ODT) disintegrating tablet 4 mg (4 mg Oral Given 09/29/22 1526)    Initial Impression / Assessment and Plan / UC Course  I have reviewed the triage vital signs and the nursing notes.  Pertinent labs & imaging results that were available during my care of the patient were reviewed by me and considered in my medical decision making (see chart for details).  Vitals in triage reviewed, patient is hemodynamically stable.  Left-sided and epigastric abdominal tenderness to palpation with active bowel sounds.  Without guarding or rebound.  ODT Zofran and fluid challenge successful, patient able to tolerate water. Reports nausea and  abdominal pain persists and requesting imaging of her abdomen. Discussed that the  UC has x-ray, which is not beneficial in this context.  Discussed that patient can head to an emergency department for further evaluation, where they can can obtain imaging if clinically indicated.  Patient verbalized understanding, will head to [facility] via personal vehicle.     Final Clinical Impressions(s) / UC Diagnoses   Final diagnoses:  Generalized abdominal pain  Nausea and vomiting, unspecified vomiting type     Discharge Instructions      Since you are having continued worsening abdominal pain and emesis despite Zofran administration, and would like further imaging, I advise you to go to [facility] emergency department for further evaluation.      ED Prescriptions   None    PDMP not reviewed this encounter.   Jessice Madill, [place], [place] 09/29/22 [phone]

## 2022-09-29 NOTE — Discharge Instructions (Signed)
Since you are having continued worsening abdominal pain and emesis despite Zofran administration, and would like further imaging, I advise you to go to [facility] emergency department for further evaluation.

## 2022-09-29 NOTE — ED Notes (Signed)
Pt transported to CT ?

## 2022-09-30 LAB — GC/CHLAMYDIA PROBE AMP (~~LOC~~) NOT AT ARMC
Chlamydia: NEGATIVE
Comment: NEGATIVE
Comment: NORMAL
Neisseria Gonorrhea: NEGATIVE

## 2022-09-30 NOTE — ED Notes (Signed)
Pt waiting shot time for rocephin at this time

## 2022-09-30 NOTE — ED Notes (Signed)
Discharge papers reviewed with pt, pt ambulatory from ED. Pt verbalized understanding of follow up instructions

## 2022-11-08 ENCOUNTER — Inpatient Hospital Stay: Payer: Medicaid Other | Admitting: Nurse Practitioner

## 2022-11-08 IMAGING — US US OB < 14 WEEKS - US OB TV
1 series · 14 of 28 positions shown · non-contrast
Comparison: None.

CLINICAL DATA: Abdominal pain

EXAM:
OBSTETRIC <14 WK US AND TRANSVAGINAL OB US
TECHNIQUE: Both transabdominal and transvaginal ultrasound examinations were
performed for complete evaluation of the gestation as well as the
maternal uterus, adnexal regions, and pelvic cul-de-sac.
Transvaginal technique was performed to assess early pregnancy.

[Series 1: us ob comp less 14 wks · 14 of 72 slices shown]
[im 3/72]
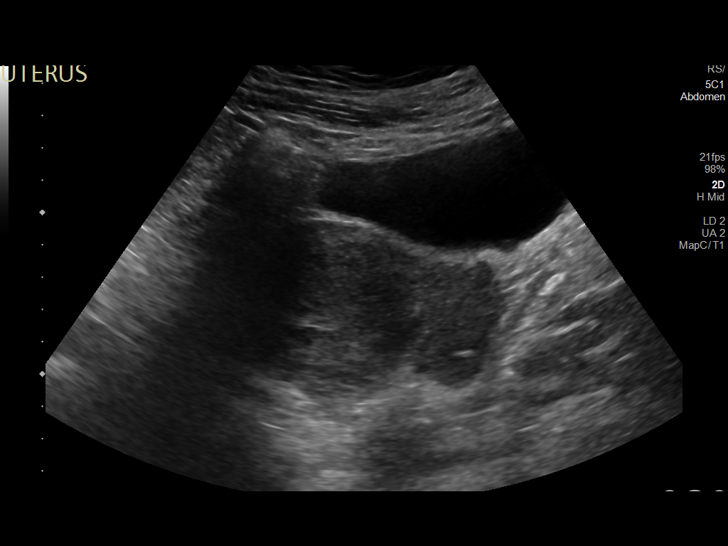
[im 8/72]
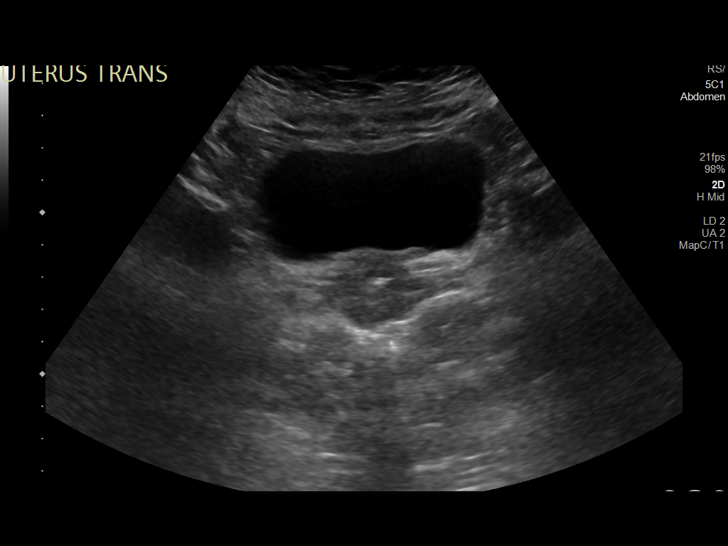
[im 14/72]
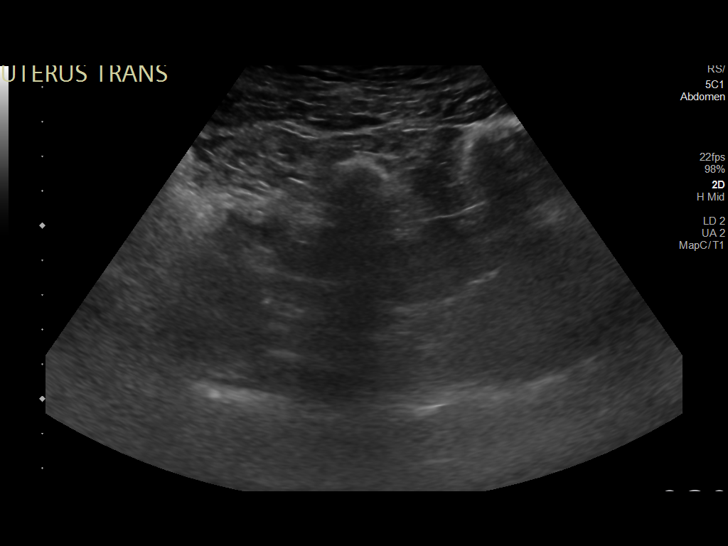
[im 19/72]
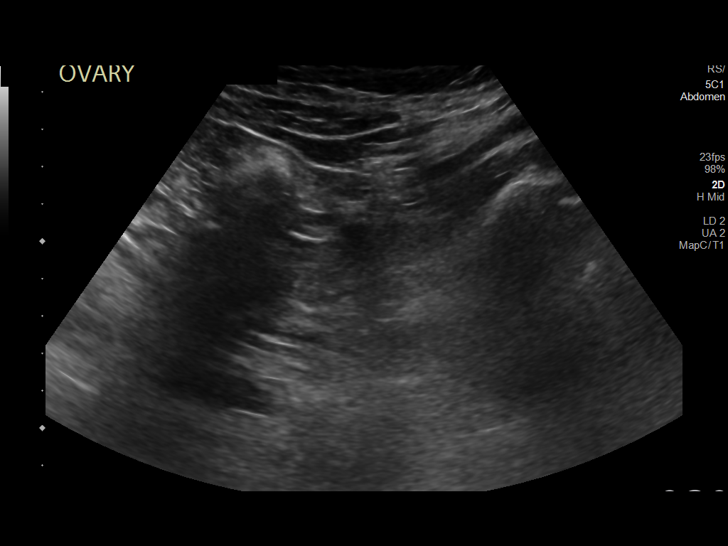
[im 24/72]
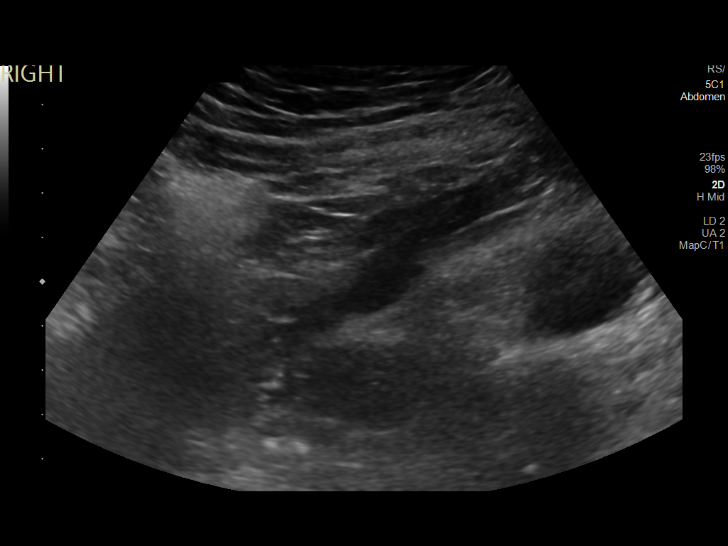
[im 29/72]
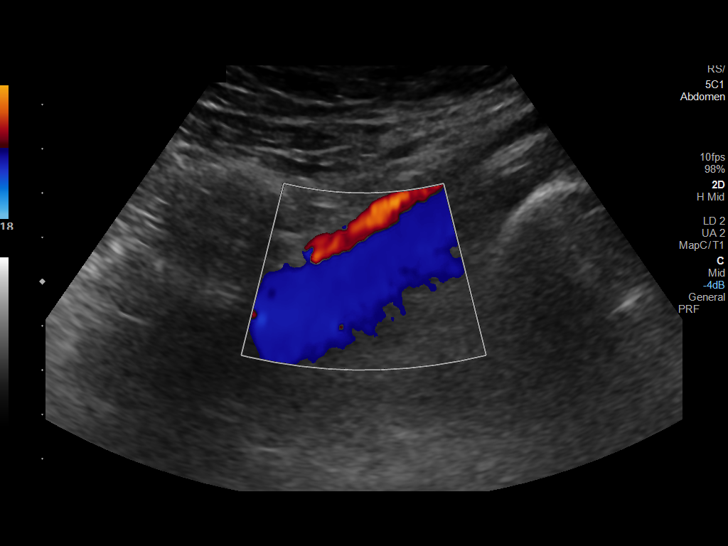
[im 35/72]
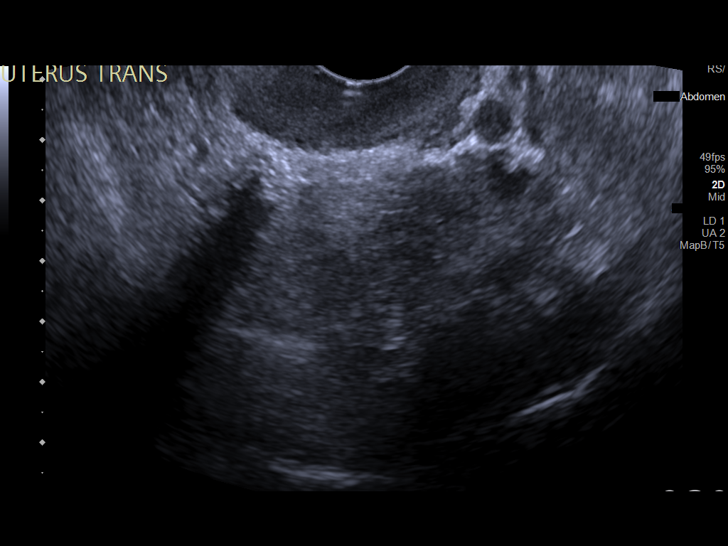
[im 40/72]
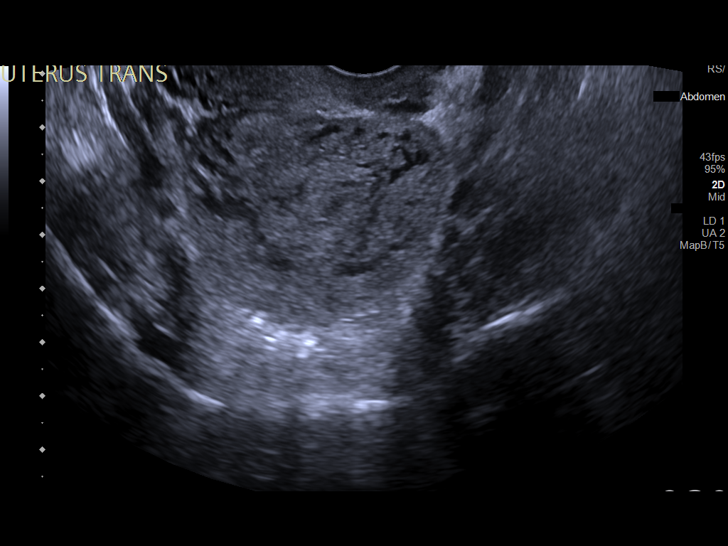
[im 45/72]
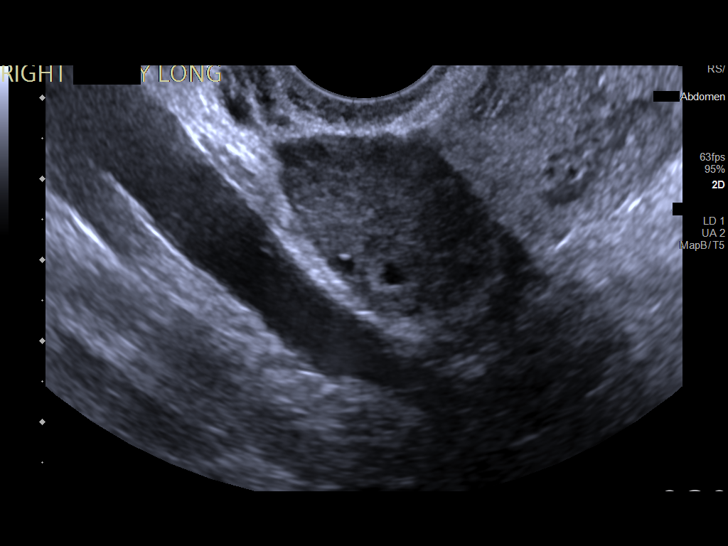
[im 50/72]
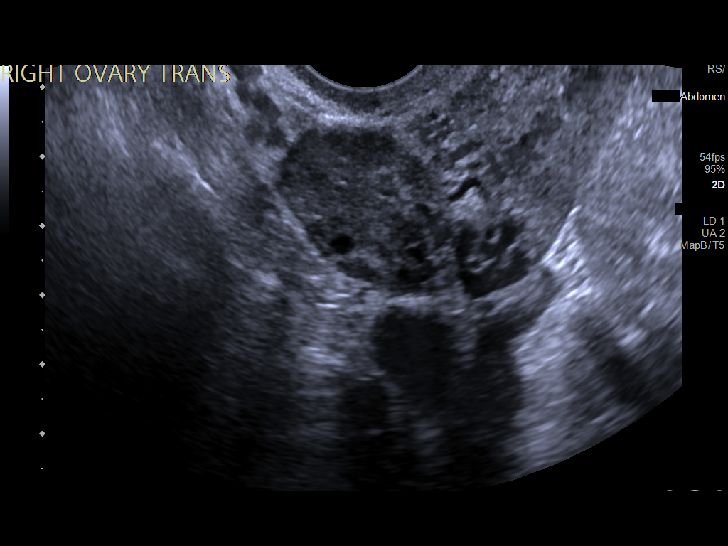
[im 56/72]
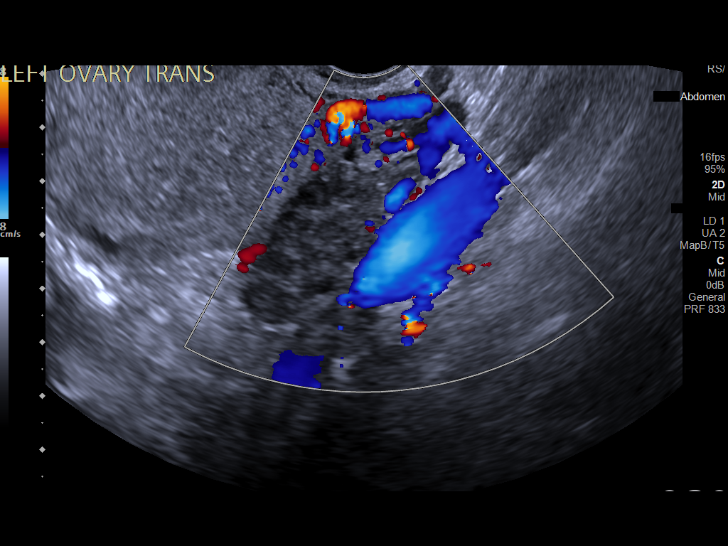
[im 61/72]
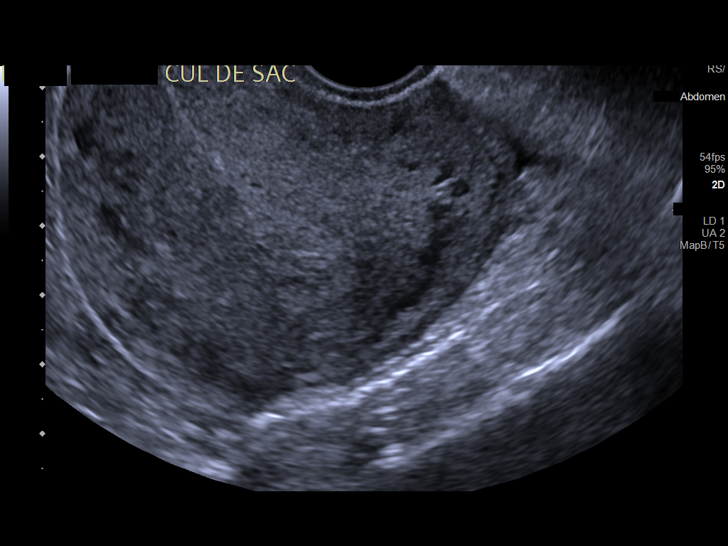
[im 66/72]
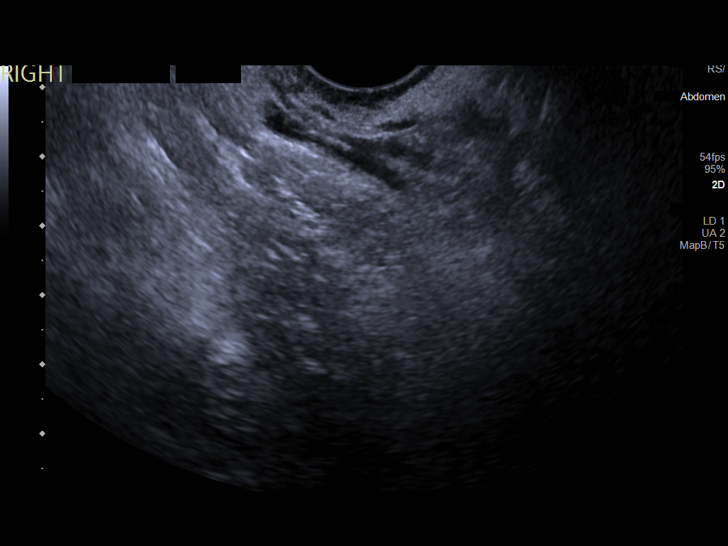
[im 72/72]
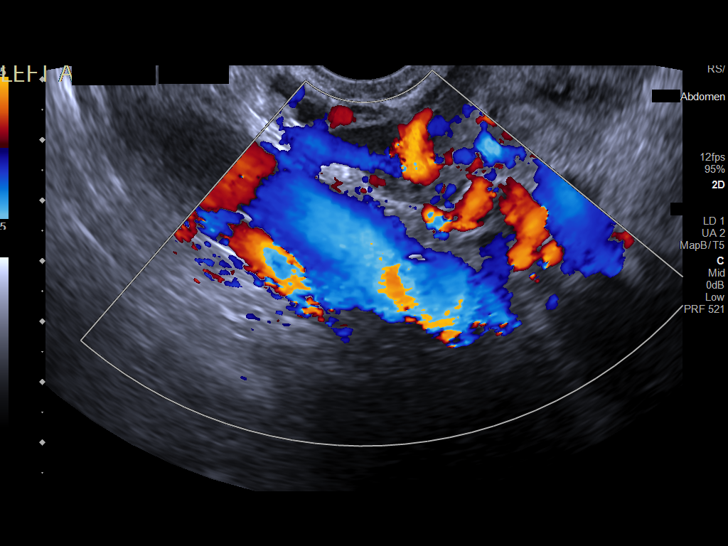

[14 of 28 positions shown; findings below may reference images not displayed]

FINDINGS: Intrauterine gestational sac: None

Yolk sac:  Not Visualized.

Embryo:  Not Visualized.

Cardiac Activity: Not Visualized.

Heart Rate:   bpm

MSD:   mm    w     d

CRL:    mm    w    d                  US EDC:

Subchorionic hemorrhage:  None visualized.

Maternal uterus/adnexae: No adnexal mass. Trace free fluid.
Endometrium 10 mm in thickness.
IMPRESSION: No intrauterine pregnancy visualized. Differential considerations
would include early intrauterine pregnancy too early to visualize,
spontaneous abortion, or occult ectopic pregnancy. Recommend close
clinical followup and serial quantitative beta HCGs and ultrasounds.

## 2022-12-09 ENCOUNTER — Encounter: Payer: Self-pay | Admitting: Nurse Practitioner

## 2022-12-09 ENCOUNTER — Ambulatory Visit: Payer: Medicaid Other | Attending: Nurse Practitioner | Admitting: Nurse Practitioner

## 2022-12-09 VITALS — BP 96/66 | HR 92 | Ht 61.0 in | Wt 154.6 lb

## 2022-12-09 DIAGNOSIS — R102 Pelvic and perineal pain: Secondary | ICD-10-CM | POA: Diagnosis not present

## 2022-12-09 DIAGNOSIS — Z09 Encounter for follow-up examination after completed treatment for conditions other than malignant neoplasm: Secondary | ICD-10-CM

## 2022-12-09 NOTE — Progress Notes (Signed)
Assessment & Plan:  Kelsey Davies was seen today for abdominal pain.  Diagnoses and all orders for this visit:  Hospital discharge follow-up Follow up with GI as scheduled  Pelvic pain -     Ambulatory referral to Gynecology    Patient has been counseled on age-appropriate routine health concerns for screening and prevention. These are reviewed and up-to-date. Referrals have been placed accordingly. Immunizations are up-to-date or declined.    Subjective:   Chief Complaint  Patient presents with   Abdominal Pain    Kelsey Davies 32 y.o. female presents to office today for HFU.    VRI was used to communicate directly with patient for the entire encounter including providing detailed patient instructions.    Kelsey Davies was seen in the ED on 09-29-2022 with complaints of lower abdominal pain. She has an upcoming appointment with GI in October for her chronic abdominal pain. She is currently not taking dicyclomine that was ordered for her. We will refill this today although she has 6 refill at the pharmacy already. Also there was concern for possible PID as she endorsed suprapubic TTTP on bimanual exam at that time along with purulent vaginal discharge. Wet prep was negative.   Today she continues with c/o persistent lower abdominal pain. Would like to know if she can be seen by GI earlier than October. I have encouraged her to reach out to them to discuss this.    CT abdomen and Pelvis 09-29-2022 IMPRESSION: 1. Interval development of shotty mesenteric adenopathy, nonspecific, possibly reactive or inflammatory as can be seen with enterocolitis or mesenteric adenitis. 2. Moderate hepatic steatosis.      Review of Systems  Constitutional:  Negative for fever, malaise/fatigue and weight loss.  HENT: Negative.  Negative for nosebleeds.   Eyes: Negative.  Negative for blurred vision, double vision and photophobia.  Respiratory: Negative.  Negative for cough and shortness of breath.    Cardiovascular: Negative.  Negative for chest pain, palpitations and leg swelling.  Gastrointestinal:  Positive for abdominal pain and constipation (intermittent). Negative for blood in stool, diarrhea, heartburn, melena, nausea and vomiting.  Musculoskeletal: Negative.  Negative for myalgias.  Neurological: Negative.  Negative for dizziness, focal weakness, seizures and headaches.  Psychiatric/Behavioral: Negative.  Negative for suicidal ideas.     Past Medical History:  Diagnosis Date   Medical history non-contributory     Past Surgical History:  Procedure Laterality Date   NO PAST SURGERIES      Family History  Problem Relation Age of Onset   Healthy Mother    Rheum arthritis Maternal Grandmother    Rheum arthritis Maternal Grandfather    Asthma Paternal Grandmother    Rheum arthritis Sister    Rheum arthritis Brother    Hearing loss Neg Hx    Colon cancer Neg Hx    Stomach cancer Neg Hx    Pancreatic cancer Neg Hx     Social History Reviewed with no changes to be made today.   Outpatient Medications Prior to Visit  Medication Sig Dispense Refill   dicyclomine (BENTYL) 10 MG capsule Take 1 capsule (10 mg total) by mouth 4 (four) times daily -  before meals and at bedtime. (Patient not taking: Reported on 12/09/2022) 60 capsule 6   doxycycline (VIBRAMYCIN) 100 MG capsule Take 1 capsule (100 mg total) by mouth 2 (two) times daily. (Patient not taking: Reported on 12/09/2022) 20 capsule 0   metroNIDAZOLE (FLAGYL) 500 MG tablet Take 1 tablet (500 mg total) by  mouth 2 (two) times daily. (Patient not taking: Reported on 12/09/2022) 14 tablet 0   Misc. Devices MISC Please supply with size M adult diapers, gloves and wipes. ICD 10 N39.3 (Patient not taking: Reported on 12/09/2022) 1 each 0   senna-docusate (SENOKOT-S) 8.6-50 MG tablet Take 2 tablets by mouth daily. (Patient not taking: Reported on 12/09/2022) 60 tablet 6   Vitamin D, Ergocalciferol, (DRISDOL) 1.25 MG (50000 UNIT) CAPS  capsule Take 1 capsule (50,000 Units total) by mouth every 7 (seven) days. Please fill as a 90 day supply (Patient not taking: Reported on 12/09/2022) 12 capsule 0   No facility-administered medications prior to visit.    No Known Allergies     Objective:    BP 96/66 (BP Location: Left Arm, Patient Position: Sitting, Cuff Size: Normal)   Pulse 92   Ht 5\' 1"  (1.549 m)   Wt 154 lb 9.6 oz (70.1 kg)   LMP 10/21/2022   SpO2 99%   BMI 29.21 kg/m  Wt Readings from Last 3 Encounters:  12/09/22 154 lb 9.6 oz (70.1 kg)  09/29/22 149 lb 14.6 oz (68 kg)  09/29/22 150 lb (68 kg)    Physical Exam Vitals and nursing note reviewed.  Constitutional:      Appearance: She is well-developed.  HENT:     Head: Normocephalic and atraumatic.  Cardiovascular:     Rate and Rhythm: Normal rate and regular rhythm.     Heart sounds: Normal heart sounds. No murmur heard.    No friction rub. No gallop.  Pulmonary:     Effort: Pulmonary effort is normal. No tachypnea or respiratory distress.     Breath sounds: Normal breath sounds. No decreased breath sounds, wheezing, rhonchi or rales.  Chest:     Chest wall: No tenderness.  Abdominal:     General: Bowel sounds are normal.     Palpations: Abdomen is soft.  Musculoskeletal:        General: Normal range of motion.     Cervical back: Normal range of motion.  Skin:    General: Skin is warm and dry.  Neurological:     Mental Status: She is alert and oriented to person, place, and time.     Coordination: Coordination normal.  Psychiatric:        Behavior: Behavior normal. Behavior is cooperative.        Thought Content: Thought content normal.        Judgment: Judgment normal.          Patient has been counseled extensively about nutrition and exercise as well as the importance of adherence with medications and regular follow-up. The patient was given clear instructions to go to ER or return to medical center if symptoms don't improve, worsen  or new problems develop. The patient verbalized understanding.   Follow-up: Return if symptoms worsen or fail to improve.   Claiborne Rigg, FNP-BC [facility] [place], [place] [phone]   12/09/2022, 2:27 PM

## 2022-12-09 NOTE — Progress Notes (Signed)
Interpreter Sushil ID# [id number]

## 2022-12-18 ENCOUNTER — Encounter: Payer: Self-pay | Admitting: Nurse Practitioner

## 2023-01-23 ENCOUNTER — Encounter: Payer: Self-pay | Admitting: Physician Assistant

## 2023-01-23 ENCOUNTER — Ambulatory Visit: Payer: Medicaid Other | Admitting: Physician Assistant

## 2023-01-23 DIAGNOSIS — R1084 Generalized abdominal pain: Secondary | ICD-10-CM | POA: Diagnosis not present

## 2023-01-23 MED ORDER — DICYCLOMINE HCL 20 MG PO TABS: 20.0000 mg | ORAL_TABLET | Freq: Three times a day (TID) | ORAL | 2 refills | Status: DC

## 2023-01-23 NOTE — Progress Notes (Signed)
Chief Complaint: Abdominal pain  HPI:    Kelsey Davies is a 31 year old [place] female with a past medical history as listed below, known to Dr. Lavon Paganini, who returns to clinic today for chronic abdominal pain.    03/31/2020 office visit with Dr. Lavon Paganini for right upper quadrant abdominal pain and chronic constipation.  At that time patient was switched to Amitiza 24 mcg twice daily instead of Linzess which caused abdominal cramping.  Discussed epigastric and right upper quadrant pain and thought this could be secondary to symptomatic gallstones given she had a 1.5 cm gallstone ultrasound in 2015.  She was referred to [facility].    07/04/2021 patient seen by PCP and at that time described gallbladder surgery a year ago and right-sided abdominal pain for the past 3 months with constipation.  Labs at that time ordered CBC and CMP as well as urinalysis.  She was given Bactrim for trace leukocytes.  CMP with minimally elevated ALT at 53 and CBC with elevated white count at 13.1    07/12/2021 patient had abdominal ultrasound.  This showed status postcholecystectomy, fatty liver and trace ascites.    07/13/2021 patient seen in clinic and continued to complain of right-sided abdominal pain which is unchanged even after cholecystectomy.  Constant and rated as an 8/10.  Had been on Amitiza which helped with her constipation but continued with abdominal pain.  At that point we are waiting on results from ultrasound as above to consider CT.    07/17/2021 CT renal stone study showed no acute findings.  Status postcholecystectomy.    07/27/2021 CT of the abdomen pelvis with contrast with no acute abnormality and mild hepatic steatosis.  At that time we trialed an antispasmodic, she was given Dicyclomine 10 mg 4 times daily.    10/19/2021 patient seen in clinic and was doing well on Dicyclomine 4 times a day.  He was thought this was likely IBS and we continued the 10 mg of dicyclomine.    09/29/2022 patient seen in the urgent  care for abdominal pain.  Also discussed constipation nausea and vomiting.  At that time recommended to go to the ER given no improvement after Zofran.  ER workup with an ALT of 66, CBC with a white count of 11.4, lipase normal, STD check negative.  Patient given morphine and Zofran.  CT abdomen pelvis showed interval development of shotty mesenteric adenopathy which is nonspecific possibly reactive or inflammatory has been seen with enterocolitis or mesenteric adenitis and moderate hepatic steatosis.  Patient was given Ceftriaxone, Doxycycline and Metronidazole x 2 weeks for PID.    Today, patient presents to clinic accompanied by her sister who acts as her interpreter.  She tells me that she does not ever recall getting any benefit from Dicyclomine which she was taking 10 mg 4 times daily.  Though at time of last follow-up in June of last year she was doing some better with this medicine.  Apparently she stopped it a month ago and none of her symptoms have changed, no worsening, she is chronic bilateral pain on her sides which is worse when she is walking around or changing positions which she describes it as a cramping just above her hip.  She does have low back pain as well and sciatica for which she has not been seen in a while.  No change in bowel habits, no constipation, nausea or vomiting.    Denies fever, chills or weight loss.  Past Medical History:  Diagnosis Date  Medical history non-contributory     Past Surgical History:  Procedure Laterality Date   NO PAST SURGERIES      Current Outpatient Medications  Medication Sig Dispense Refill   dicyclomine (BENTYL) 10 MG capsule Take 1 capsule (10 mg total) by mouth 4 (four) times daily -  before meals and at bedtime. (Patient not taking: Reported on 12/09/2022) 60 capsule 6   doxycycline (VIBRAMYCIN) 100 MG capsule Take 1 capsule (100 mg total) by mouth 2 (two) times daily. (Patient not taking: Reported on 12/09/2022) 20 capsule 0    metroNIDAZOLE (FLAGYL) 500 MG tablet Take 1 tablet (500 mg total) by mouth 2 (two) times daily. (Patient not taking: Reported on 12/09/2022) 14 tablet 0   Misc. Devices MISC Please supply with size M adult diapers, gloves and wipes. ICD 10 N39.3 (Patient not taking: Reported on 12/09/2022) 1 each 0   senna-docusate (SENOKOT-S) 8.6-50 MG tablet Take 2 tablets by mouth daily. (Patient not taking: Reported on 12/09/2022) 60 tablet 6   Vitamin D, Ergocalciferol, (DRISDOL) 1.25 MG (50000 UNIT) CAPS capsule Take 1 capsule (50,000 Units total) by mouth every 7 (seven) days. Please fill as a 90 day supply (Patient not taking: Reported on 12/09/2022) 12 capsule 0   No current facility-administered medications for this visit.    Allergies as of 01/23/2023   (No Known Allergies)    Family History  Problem Relation Age of Onset   Healthy Mother    Rheum arthritis Maternal Grandmother    Rheum arthritis Maternal Grandfather    Asthma Paternal Grandmother    Rheum arthritis Sister    Rheum arthritis Brother    Hearing loss Neg Hx    Colon cancer Neg Hx    Stomach cancer Neg Hx    Pancreatic cancer Neg Hx     Social History   Socioeconomic History   Marital status: Married    Spouse name: Not on file   Number of children: Not on file   Years of education: Not on file   Highest education level: Not on file  Occupational History   Not on file  Tobacco Use   Smoking status: Never   Smokeless tobacco: Never  Vaping Use   Vaping status: Never Used  Substance and Sexual Activity   Alcohol use: No   Drug use: No   Sexual activity: Yes    Birth control/protection: None  Other Topics Concern   Not on file  Social History Narrative   Not on file   Social Determinants of Health   Financial Resource Strain: Not on file  Food Insecurity: No Food Insecurity (06/08/2018)   Hunger Vital Sign    Worried About Running Out of Food in the Last Year: Never true    Ran Out of Food in the Last Year:  Never true  Transportation Needs: No Transportation Needs (06/08/2018)   PRAPARE - Administrator, Civil Service (Medical): No    Lack of Transportation (Non-Medical): No  Physical Activity: Not on file  Stress: Not on file  Social Connections: Not on file  Intimate Partner Violence: Not on file    Review of Systems:    Constitutional: No weight loss, fever or chills Cardiovascular: No chest pain Respiratory: No SOB  Gastrointestinal: See HPI and otherwise negative   Physical Exam:  Vital signs: BP 98/60   Pulse 98   Ht 4\' 11"  (1.499 m)   Wt 153 lb (69.4 kg)   BMI 30.90 kg/m  Constitutional:   Pleasant Nepali female appears to be in NAD, Well developed, Well nourished, alert and cooperative Respiratory: Respirations even and unlabored. Lungs clear to auscultation bilaterally.   No wheezes, crackles, or rhonchi.  Cardiovascular: Normal S1, S2. No MRG. Regular rate and rhythm. No peripheral edema, cyanosis or pallor.  Gastrointestinal:  Soft, nondistended, Mild ttp on both sides of abdomen. No rebound or guarding. Normal bowel sounds. No appreciable masses or hepatomegaly. Rectal:  Not performed.  Msk:  Symmetrical without gross deformities. Without edema, no deformity or joint abnormality. +ttp low back Psychiatric:  Demonstrates good judgement and reason without abnormal affect or behaviors.  RELEVANT LABS AND IMAGING: CBC    Component Value Date/Time   WBC 11.4 (H) 09/29/2022 1744   RBC 4.67 09/29/2022 1744   HGB 12.3 09/29/2022 1744   HGB 11.9 06/24/2022 1631   HCT 39.3 09/29/2022 1744   HCT 37.1 06/24/2022 1631   PLT 466 (H) 09/29/2022 1744   PLT 400 06/24/2022 1631   MCV 84.2 09/29/2022 1744   MCV 83 06/24/2022 1631   MCH 26.3 09/29/2022 1744   MCHC 31.3 09/29/2022 1744   RDW 13.9 09/29/2022 1744   RDW 13.5 06/24/2022 1631   LYMPHSABS 3.4 (H) 06/24/2022 1631   MONOABS 0.6 01/11/2022 1315   EOSABS 0.5 (H) 06/24/2022 1631   BASOSABS 0.1 06/24/2022  1631    CMP     Component Value Date/Time   NA 138 09/29/2022 1744   NA 141 06/04/2022 1043   K 3.6 09/29/2022 1744   CL 104 09/29/2022 1744   CO2 24 09/29/2022 1744   GLUCOSE 88 09/29/2022 1744   BUN 7 09/29/2022 1744   BUN 10 06/04/2022 1043   CREATININE 0.61 09/29/2022 1744   CREATININE 0.61 01/11/2022 1315   CREATININE 0.73 12/18/2015 1634   CALCIUM 8.7 (L) 09/29/2022 1744   PROT 7.0 09/29/2022 1744   PROT 6.8 06/04/2022 1043   ALBUMIN 3.9 09/29/2022 1744   ALBUMIN 4.3 06/04/2022 1043   AST 35 09/29/2022 1744   AST 21 01/11/2022 1315   ALT 66 (H) 09/29/2022 1744   ALT 32 01/11/2022 1315   ALKPHOS 80 09/29/2022 1744   BILITOT 0.3 09/29/2022 1744   BILITOT 0.2 06/04/2022 1043   BILITOT 0.4 01/11/2022 1315   GFRNONAA >60 09/29/2022 1744   GFRNONAA >60 01/11/2022 1315   GFRNONAA >89 09/20/2014 1538   GFRAA 144 08/03/2019 1029   GFRAA >89 09/20/2014 1538    Assessment: 1.  Abdominal pain: Pain on both sides of her abdomen, recent imaging showing PID back in June treated with multiple antibiotics, prior to this CT was normal and patient given Dicyclomine which helped this pain in the past the patient does not recall this, no change in bowel habits, no fever, no weight loss; consider GI source for his musculoskeletal versus other  Plan: Question if Possibly her weight is adding to discomfort and this may be musculoskeletal in nature instead of GI. 2.  We will trial an increase in Dicyclomine to 20 mg 4 times daily, 20 to 30 minutes before meals and at bedtime #120 with 2 refills. 3.  Discussed if the above is not helpful the next step in GI workup would be a colonoscopy, though we may want to consider referral to Terrilee Files, DO prior to pursuing anesthesia. 4.  Patient to follow in clinic with me in 2 to 4 months or sooner if needed.  She will let me know how she is doing through  MyChart.  Hyacinth Meeker, PA-C [facility] 01/23/2023, 10:53 AM  Cc: Claiborne Rigg, NP

## 2023-01-23 NOTE — Patient Instructions (Signed)
We have sent the following medications to your pharmacy for you to pick up at your convenience: dicyclomine 20 mg.  The [facility] providers would like to encourage you to use [facility] to communicate with providers for non-urgent requests or questions.  Due to long hold times on the telephone, sending your provider a message by [facility] may be a faster and more efficient way to get a response.  Please allow 48 business hours for a response.  Please remember that this is for non-urgent requests.

## 2023-02-12 ENCOUNTER — Other Ambulatory Visit: Payer: Self-pay

## 2023-02-12 ENCOUNTER — Ambulatory Visit: Payer: Medicaid Other | Admitting: Obstetrics and Gynecology

## 2023-02-12 VITALS — BP 118/80 | HR 96 | Wt 153.2 lb

## 2023-02-12 DIAGNOSIS — G8929 Other chronic pain: Secondary | ICD-10-CM | POA: Diagnosis not present

## 2023-02-12 DIAGNOSIS — Z789 Other specified health status: Secondary | ICD-10-CM | POA: Diagnosis not present

## 2023-02-12 DIAGNOSIS — R102 Pelvic and perineal pain: Secondary | ICD-10-CM

## 2023-02-12 DIAGNOSIS — N941 Unspecified dyspareunia: Secondary | ICD-10-CM

## 2023-02-12 DIAGNOSIS — Z1330 Encounter for screening examination for mental health and behavioral disorders, unspecified: Secondary | ICD-10-CM

## 2023-02-12 NOTE — Progress Notes (Unsigned)
NEW GYNECOLOGY PATIENT Patient name: Kelsey Davies MRN [id number]  Date of birth: 01-Apr-1991 Chief Complaint:   Gynecologic Exam and Pelvic Pain     History:  Kelsey Davies is a 33 y.o. [id number] being seen today for pelvic pain. The pain is cramping in nature. She has the same pain with intercourse. Occurs with voiding when she is on her menses. Pain can occur with and without a menses. She had pain on her sides during pregnancies. Will have pain 1 week prior to menses, which has always been the case. Prior rx: tylenol which was not really helpful. When voiding, only a little comes out. No straining with BM. Only pain for a while during intercourse, no pain after. No lube used during intercourse. Gradual pain that has been getting worse.    Had CT scan done and was told her uterus is "swollen" and prescribed medication. Rates pain 7/10  Declined interpreter.       Gynecologic History No LMP recorded. Contraception: none Last Pap:     Component Value Date/Time   DIAGPAP  11/30/2021 1018    - Negative for intraepithelial lesion or malignancy (NILM)   DIAGPAP  05/11/2018 0000    NEGATIVE FOR INTRAEPITHELIAL LESIONS OR MALIGNANCY.   HPVHIGH Negative 11/30/2021 1018   ADEQPAP  11/30/2021 1018    Satisfactory for evaluation; transformation zone component ABSENT.   ADEQPAP Satisfactory for evaluation. 05/11/2018 0000    High Risk HPV: Positive  Adequacy:  Satisfactory for evaluation, transformation zone component PRESENT  Diagnosis:  Atypical squamous cells of undetermined significance (ASC-US)  Last Mammogram: n/a Last Colonoscopy: n/a  Obstetric History OB History  Gravida Para Term Preterm AB Living  2 2 2     2   SAB IAB Ectopic Multiple Live Births        0 2    # Outcome Date GA Lbr Len/2nd Weight Sex Type Anes PTL Lv  2 Term 11/17/18 [redacted]w[redacted]d 07:21 / 00:34 6 lb 3.1 oz (2.809 kg) M Vag-Spont EPI  LIV  1 Term 06/23/13 [redacted]w[redacted]d / 01:04 5 lb 13.8 oz (2.66 kg) M Vag-Spont EPI   LIV    Past Medical History:  Diagnosis Date   Medical history non-contributory     Past Surgical History:  Procedure Laterality Date   CHOLECYSTECTOMY     NO PAST SURGERIES      Current Outpatient Medications on File Prior to Visit  Medication Sig Dispense Refill   metroNIDAZOLE (FLAGYL) 500 MG tablet Take 1 tablet (500 mg total) by mouth 2 (two) times daily. 14 tablet 0   dicyclomine (BENTYL) 20 MG tablet Take 1 tablet (20 mg total) by mouth 4 (four) times daily -  before meals and at bedtime. (Patient not taking: Reported on 02/12/2023) 120 tablet 2   doxycycline (VIBRAMYCIN) 100 MG capsule Take 1 capsule (100 mg total) by mouth 2 (two) times daily. (Patient not taking: Reported on 02/12/2023) 20 capsule 0   Misc. Devices MISC Please supply with size M adult diapers, gloves and wipes. ICD 10 N39.3 (Patient not taking: Reported on 02/12/2023) 1 each 0   senna-docusate (SENOKOT-S) 8.6-50 MG tablet Take 2 tablets by mouth daily. (Patient not taking: Reported on 02/12/2023) 60 tablet 6   Vitamin D, Ergocalciferol, (DRISDOL) 1.25 MG (50000 UNIT) CAPS capsule Take 1 capsule (50,000 Units total) by mouth every 7 (seven) days. Please fill as a 90 day supply (Patient not taking: Reported on 02/12/2023) 12 capsule 0   No current facility-administered medications  on file prior to visit.    No Known Allergies  Social History:  reports that she has never smoked. She has never used smokeless tobacco. She reports that she does not drink alcohol and does not use drugs.  Family History  Problem Relation Age of Onset   Healthy Mother    Rheum arthritis Maternal Grandmother    Rheum arthritis Maternal Grandfather    Asthma Paternal Grandmother    Rheum arthritis Sister    Rheum arthritis Brother    Hearing loss Neg Hx    Colon cancer Neg Hx    Stomach cancer Neg Hx    Pancreatic cancer Neg Hx     The following portions of the patient's history were reviewed and updated as appropriate:  allergies, current medications, past family history, past medical history, past social history, past surgical history and problem list.  Review of Systems Pertinent items noted in HPI and remainder of comprehensive ROS otherwise negative.  Physical Exam:  BP 118/80   Pulse 96   Wt 153 lb 3.2 oz (69.5 kg)   BMI 30.94 kg/m  Physical Exam Vitals and nursing note reviewed. Exam conducted with a chaperone present.  Constitutional:      Appearance: Normal appearance.  Pulmonary:     Effort: Pulmonary effort is normal.  Abdominal:     Palpations: Abdomen is soft.     Comments: + [place]  Genitourinary:    General: Normal vulva.     Exam position: Lithotomy position.     Comments: Normal appearing vulva Normal vulvar sensation bilaterally Nontender superficial pelvic floor muscles Nontender ischial tuberosities bilaterally  Allodynia at introitus: Yes: 3 and 9 oclock  Anal wink not present Posterior vaginal wall tender 5/10 Right levator ani 6/10 Right ischiococcygeous 8/10 Right obturator internus 8/10 Left levator ani 8/10 Left ischioccocygeous 9/10 Left obturator internus 9/10 Anterior vaginal wall tender Uterine tenderness present    Neurological:     Mental Status: She is alert.        Assessment and Plan:   1. Chronic pelvic pain in female - Ambulatory referral to Physical Therapy  2. 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  Reviewed the prior visit emergency department with a suspected PID given abnormal vaginal discharge, but no other findings to support diagnosis of PID.  Reviewed the uterus. CT scan. - Ambulatory referral to Physical Therapy  3. Non-English speaking patient Patient declined interpreter, use family/friend interpreter for visit.    Routine preventative health maintenance measures emphasized. Please refer to After Visit Summary for other counseling  recommendations.   Follow-up: No follow-ups on file.      Lorriane Shire, MD Obstetrician & Gynecologist, Faculty Practice [facility] for [employer], [facility]

## 2023-05-13 ENCOUNTER — Ambulatory Visit: Payer: Medicaid Other | Admitting: Physician Assistant

## 2023-05-14 NOTE — Therapy (Incomplete)
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  Rationale for Evaluation and Treatment: Rehabilitation  ONSET DATE: ***  SUBJECTIVE:                                                                                                                                                                                           SUBJECTIVE STATEMENT: *** Fluid intake: {Yes/No:304960894}   PAIN:  Are you having pain? {yes/no:20286} NPRS scale: ***/10 Pain location: {pelvic pain location:27098}  Pain type: {type:313116} Pain description: {PAIN DESCRIPTION:21022940}   Aggravating factors: *** Relieving factors: ***  PRECAUTIONS: {Therapy precautions:24002}  RED FLAGS: {PT Red Flags:29287}   WEIGHT BEARING RESTRICTIONS: {Yes ***/No:24003}  FALLS:  Has patient fallen in last 6 months? {fallsyesno:27318}  LIVING ENVIRONMENT: Lives with: {OPRC lives with:25569::"lives with their family"} Lives in: {Lives in:25570} Stairs: {opstairs:27293} Has following equipment at  home: {Assistive devices:23999}  OCCUPATION: ***  PLOF: {PLOF:24004}  PATIENT GOALS: ***  PERTINENT HISTORY:  *** Sexual abuse: {Yes/No:304960894}  BOWEL MOVEMENT: Pain with bowel movement: {yes/no:20286} Type of bowel movement:{PT BM type:27100} Fully empty rectum: {Yes/No:304960894} Leakage: {Yes/No:304960894} Pads: {Yes/No:304960894} Fiber supplement: {Yes/No:304960894}  URINATION: Pain with urination: {yes/no:20286} Fully empty bladder: {Yes/No:304960894} Stream: {PT urination:27102} Urgency: {Yes/No:304960894} Frequency: *** Leakage: {PT leakage:27103} Pads: {Yes/No:304960894}  INTERCOURSE: Pain with intercourse: {pain with intercourse PA:27099} Ability to have vaginal penetration:  {Yes/No:304960894} Climax: *** Marinoff Scale: ***/3  PREGNANCY: Vaginal deliveries *** Tearing {Yes***/No:304960894} C-section deliveries *** Currently pregnant {Yes***/No:304960894}  PROLAPSE: {PT prolapse:27101}   OBJECTIVE:  Note: Objective measures were completed at Evaluation unless otherwise noted.  DIAGNOSTIC FINDINGS:  ***  PATIENT SURVEYS:  {rehab surveys:24030}  PFIQ-7 ***  COGNITION: Overall cognitive status: {cognition:24006}     SENSATION: Light touch: {intact/deficits:24005}  Proprioception: {intact/deficits:24005}  MUSCLE LENGTH: Hamstrings: Right *** deg; Left *** deg Maisie Fus test: Right *** deg; Left *** deg  LUMBAR SPECIAL TESTS:  {lumbar special test:25242}  FUNCTIONAL TESTS:  {Functional tests:24029}  GAIT: Distance walked: *** Assistive device utilized: {Assistive devices:23999} Level of assistance: {Levels of assistance:24026} Comments: ***  POSTURE: {posture:25561}  PELVIC ALIGNMENT:  LUMBARAROM/PROM:  A/PROM A/PROM  eval  Flexion   Extension   Right lateral flexion   Left lateral flexion   Right rotation   Left rotation    (Blank rows = not tested)  LOWER EXTREMITY ROM:  {AROM/PROM:27142} ROM Right eval Left eval   Hip flexion    Hip extension    Hip abduction    Hip adduction    Hip internal rotation    Hip external rotation    Knee flexion    Knee extension    Ankle dorsiflexion    Ankle plantarflexion    Ankle inversion    Ankle eversion     (Blank rows = not tested)  LOWER EXTREMITY MMT:  MMT Right eval Left eval  Hip flexion    Hip extension    Hip abduction    Hip adduction    Hip internal rotation    Hip external rotation    Knee flexion    Knee extension    Ankle dorsiflexion    Ankle plantarflexion    Ankle inversion    Ankle eversion     PALPATION:   General  ***                External Perineal Exam ***                             Internal Pelvic Floor ***  Patient confirms identification and approves PT to assess internal pelvic floor and treatment {yes/no:20286}  PELVIC MMT:   MMT eval  Vaginal   Internal Anal Sphincter   External Anal Sphincter   Puborectalis   Diastasis Recti   (Blank rows = not tested)        TONE: ***  PROLAPSE: ***  TODAY'S TREATMENT:                                                                                                                              DATE: ***  EVAL ***   PATIENT EDUCATION:  Education details: *** Person educated: {Person educated:25204} Education method: {Education Method:25205} Education comprehension: {Education Comprehension:25206}  HOME EXERCISE PROGRAM: ***  ASSESSMENT:  CLINICAL IMPRESSION: Patient is a *** y.o. *** who was seen today for physical therapy evaluation and treatment for ***.   OBJECTIVE IMPAIRMENTS: {opptimpairments:25111}.   ACTIVITY LIMITATIONS: {activitylimitations:27494}  PARTICIPATION LIMITATIONS: {participationrestrictions:25113}  PERSONAL FACTORS: {Personal factors:25162} are also affecting patient's functional outcome.   REHAB POTENTIAL: {rehabpotential:25112}  CLINICAL DECISION MAKING: {clinical decision making:25114}  EVALUATION COMPLEXITY:  {Evaluation complexity:25115}   GOALS: Goals reviewed with patient? {yes/no:20286}  SHORT TERM GOALS: Target date: ***  *** Baseline: Goal status: INITIAL  2.  *** Baseline:  Goal status: INITIAL  3.  *** Baseline:  Goal status: INITIAL  4.  *** Baseline:  Goal status: INITIAL  5.  *** Baseline:  Goal status: INITIAL  6.  *** Baseline:  Goal status: INITIAL  LONG TERM GOALS: Target date: ***  *** Baseline:  Goal status: INITIAL  2.  *** Baseline:  Goal status: INITIAL  3.  *** Baseline:  Goal status: INITIAL  4.  *** Baseline:  Goal status: INITIAL  5.  *** Baseline:  Goal status: INITIAL  6.  *** Baseline:  Goal status: INITIAL  PLAN:  PT FREQUENCY: {rehab frequency:25116}  PT DURATION: {rehab duration:25117}  PLANNED INTERVENTIONS: {rehab planned interventions:25118::"97110-Therapeutic exercises","97530- Therapeutic [phone]- Neuromuscular re-education","97535- Self YQIH","47425- Manual therapy"}  PLAN FOR NEXT SESSION: ***   Geanine Vandekamp, PT 05/14/2023, 5:31 PM

## 2023-05-15 ENCOUNTER — Telehealth: Payer: Self-pay | Admitting: Physical Therapy

## 2023-05-15 ENCOUNTER — Ambulatory Visit: Payer: Medicaid Other | Attending: Obstetrics and Gynecology | Admitting: Physical Therapy

## 2023-05-15 DIAGNOSIS — R102 Pelvic and perineal pain: Secondary | ICD-10-CM | POA: Insufficient documentation

## 2023-05-15 DIAGNOSIS — M6281 Muscle weakness (generalized): Secondary | ICD-10-CM | POA: Insufficient documentation

## 2023-05-15 DIAGNOSIS — M62838 Other muscle spasm: Secondary | ICD-10-CM | POA: Insufficient documentation

## 2023-05-15 NOTE — Telephone Encounter (Signed)
Spoke with pt re her missed appt today, she stated she did not know about it. She will come back on 2/6
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
## 2023-05-22 ENCOUNTER — Ambulatory Visit: Payer: Medicaid Other | Admitting: Physical Therapy

## 2023-05-22 ENCOUNTER — Other Ambulatory Visit: Payer: Self-pay

## 2023-05-22 ENCOUNTER — Encounter: Payer: Self-pay | Admitting: Physical Therapy

## 2023-05-22 DIAGNOSIS — R102 Pelvic and perineal pain: Secondary | ICD-10-CM

## 2023-05-22 DIAGNOSIS — M6281 Muscle weakness (generalized): Secondary | ICD-10-CM | POA: Diagnosis present

## 2023-05-22 DIAGNOSIS — M62838 Other muscle spasm: Secondary | ICD-10-CM

## 2023-05-29 ENCOUNTER — Encounter: Payer: Medicaid Other | Admitting: Physical Therapy

## 2023-06-05 ENCOUNTER — Ambulatory Visit: Payer: Medicaid Other | Attending: Obstetrics and Gynecology | Admitting: Physical Therapy

## 2023-06-05 ENCOUNTER — Encounter: Payer: Self-pay | Admitting: Physical Therapy

## 2023-06-05 DIAGNOSIS — M6281 Muscle weakness (generalized): Secondary | ICD-10-CM | POA: Diagnosis present

## 2023-06-05 DIAGNOSIS — R102 Pelvic and perineal pain: Secondary | ICD-10-CM | POA: Diagnosis present

## 2023-06-05 DIAGNOSIS — M62838 Other muscle spasm: Secondary | ICD-10-CM | POA: Diagnosis present
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
## 2023-06-12 ENCOUNTER — Ambulatory Visit: Payer: Medicaid Other | Admitting: Physical Therapy

## 2023-07-04 ENCOUNTER — Ambulatory Visit: Payer: Medicaid Other | Attending: Nurse Practitioner | Admitting: Nurse Practitioner

## 2023-07-04 ENCOUNTER — Encounter: Payer: Self-pay | Admitting: Nurse Practitioner

## 2023-07-04 VITALS — BP 101/69 | HR 93 | Resp 19 | Ht 59.0 in | Wt 156.6 lb

## 2023-07-04 DIAGNOSIS — N943 Premenstrual tension syndrome: Secondary | ICD-10-CM | POA: Diagnosis not present

## 2023-07-04 DIAGNOSIS — L918 Other hypertrophic disorders of the skin: Secondary | ICD-10-CM

## 2023-07-04 MED ORDER — IBUPROFEN 600 MG PO TABS
600.0000 mg | ORAL_TABLET | Freq: Three times a day (TID) | ORAL | 1 refills | Status: DC | PRN
Start: 2023-07-04 — End: 2023-08-12

## 2023-07-04 NOTE — Progress Notes (Signed)
 Assessment & Plan:  Kelsey Davies was seen today for underarm pain.  Diagnoses and all orders for this visit:  Skin tag -     Ambulatory referral to Dermatology  PMS (premenstrual syndrome) -     ibuprofen (ADVIL) 600 MG tablet; Take 1 tablet (600 mg total) by mouth every 8 (eight) hours as needed.    Patient has been counseled on age-appropriate routine health concerns for screening and prevention. These are reviewed and up-to-date. Referrals have been placed accordingly. Immunizations are up-to-date or declined.    Subjective:   Chief Complaint  Patient presents with   underarm pain    Kelsey Davies 33 y.o. female presents to office today complaints of premenstrual symptoms of back pain.   She is currently being followed by GI and GYN for pelvic and abdominal pain.    She endorses persistent back pain. Onset of back pain 3 months ago. Worse with menstrual cycle and usually starts 1 week prior and lasts the entire duration of her cycle. Pain occurs in the lower abdomen and radiates to the lower back. She denies constipation although she does have a history of this.    She has a skin tag on the right axilla that has grown large enough to rub against her clothing and is irritating the skin when her arm is closed. She would like to have this removed.   Review of Systems  Constitutional:  Negative for fever, malaise/fatigue and weight loss.  HENT: Negative.  Negative for nosebleeds.   Eyes: Negative.  Negative for blurred vision, double vision and photophobia.  Respiratory: Negative.  Negative for cough and shortness of breath.   Cardiovascular: Negative.  Negative for chest pain, palpitations and leg swelling.  Gastrointestinal: Negative.  Negative for heartburn, nausea and vomiting.  Musculoskeletal:  Positive for back pain. Negative for myalgias.  Neurological: Negative.  Negative for dizziness, focal weakness, seizures and headaches.  Psychiatric/Behavioral: Negative.   Negative for suicidal ideas.     Past Medical History:  Diagnosis Date   Medical history non-contributory     Past Surgical History:  Procedure Laterality Date   CHOLECYSTECTOMY     NO PAST SURGERIES      Family History  Problem Relation Age of Onset   Healthy Mother    Rheum arthritis Maternal Grandmother    Rheum arthritis Maternal Grandfather    Asthma Paternal Grandmother    Rheum arthritis Sister    Rheum arthritis Brother    Hearing loss Neg Hx    Colon cancer Neg Hx    Stomach cancer Neg Hx    Pancreatic cancer Neg Hx     Social History Reviewed with no changes to be made today.   Outpatient Medications Prior to Visit  Medication Sig Dispense Refill   dicyclomine (BENTYL) 20 MG tablet Take 1 tablet (20 mg total) by mouth 4 (four) times daily -  before meals and at bedtime. 120 tablet 2   doxycycline (VIBRAMYCIN) 100 MG capsule Take 1 capsule (100 mg total) by mouth 2 (two) times daily. 20 capsule 0   Misc. Devices MISC Please supply with size M adult diapers, gloves and wipes. ICD 10 N39.3 1 each 0   senna-docusate (SENOKOT-S) 8.6-50 MG tablet Take 2 tablets by mouth daily. (Patient not taking: Reported on 07/04/2023) 60 tablet 6   Vitamin D, Ergocalciferol, (DRISDOL) 1.25 MG (50000 UNIT) CAPS capsule Take 1 capsule (50,000 Units total) by mouth every 7 (seven) days. Please fill as a 90 day  supply (Patient not taking: Reported on 07/04/2023) 12 capsule 0   metroNIDAZOLE (FLAGYL) 500 MG tablet Take 1 tablet (500 mg total) by mouth 2 (two) times daily. (Patient not taking: Reported on 07/04/2023) 14 tablet 0   No facility-administered medications prior to visit.    No Known Allergies     Objective:    BP 101/69 (BP Location: Left Arm, Patient Position: Sitting, Cuff Size: Normal)   Pulse 93   Resp 19   Ht 4\' 11"  (1.499 m)   Wt 156 lb 9.6 oz (71 kg)   LMP 07/02/2023 (Exact Date)   SpO2 96%   BMI 31.63 kg/m  Wt Readings from Last 3 Encounters:  07/04/23 156 lb  9.6 oz (71 kg)  02/12/23 153 lb 3.2 oz (69.5 kg)  01/23/23 153 lb (69.4 kg)    Physical Exam Vitals and nursing note reviewed.  Constitutional:      Appearance: She is well-developed.  HENT:     Head: Normocephalic and atraumatic.  Cardiovascular:     Rate and Rhythm: Normal rate and regular rhythm.     Heart sounds: Normal heart sounds. No murmur heard.    No friction rub. No gallop.  Pulmonary:     Effort: Pulmonary effort is normal. No tachypnea or respiratory distress.     Breath sounds: Normal breath sounds. No decreased breath sounds, wheezing, rhonchi or rales.  Chest:     Chest wall: No tenderness.  Abdominal:     General: Bowel sounds are normal.     Palpations: Abdomen is soft.  Musculoskeletal:        General: Normal range of motion.     Cervical back: Normal range of motion.  Skin:    General: Skin is warm and dry.  Neurological:     Mental Status: She is alert and oriented to person, place, and time.     Coordination: Coordination normal.  Psychiatric:        Behavior: Behavior normal. Behavior is cooperative.        Thought Content: Thought content normal.        Judgment: Judgment normal.          Patient has been counseled extensively about nutrition and exercise as well as the importance of adherence with medications and regular follow-up. The patient was given clear instructions to go to ER or return to medical center if symptoms don't improve, worsen or new problems develop. The patient verbalized understanding.   Follow-up: Return if symptoms worsen or fail to improve.   Claiborne Rigg, FNP-BC [facility] [place], [place] [phone]   07/04/2023, 3:29 PM

## 2023-07-24 ENCOUNTER — Ambulatory Visit: Payer: Medicaid Other | Admitting: Physician Assistant

## 2023-07-24 ENCOUNTER — Encounter: Payer: Self-pay | Admitting: Physician Assistant

## 2023-07-24 VITALS — BP 124/86 | HR 105 | Ht 59.0 in | Wt 157.8 lb

## 2023-07-24 DIAGNOSIS — R1084 Generalized abdominal pain: Secondary | ICD-10-CM | POA: Diagnosis not present

## 2023-07-24 DIAGNOSIS — R1013 Epigastric pain: Secondary | ICD-10-CM

## 2023-07-24 DIAGNOSIS — G8929 Other chronic pain: Secondary | ICD-10-CM | POA: Diagnosis not present

## 2023-07-24 MED ORDER — NA SULFATE-K SULFATE-MG SULF 17.5-3.13-1.6 GM/177ML PO SOLN: 1.0000 | Freq: Once | ORAL | 0 refills | Status: AC

## 2023-07-24 NOTE — Progress Notes (Signed)
 Chief Complaint: Generalized Abdominal Pain  HPI:     Mrs. Villalon is a 69 years-year-old female who speaks Nepali, known to Dr. Lavon Paganini, who returns to clinic today for follow-up of her abdominal pain.  03/31/2020 office visit with Dr. Lavon Paganini for right upper quadrant abdominal pain and chronic constipation.  At that time patient was switched to Amitiza 24 mcg twice daily instead of Linzess which caused abdominal cramping.  Discussed epigastric and right upper quadrant pain and thought this could be secondary to symptomatic gallstones given she had a 1.5 cm gallstone ultrasound in 2015.  She was referred to [facility].    07/04/2021 patient seen by PCP and at that time described gallbladder surgery a year ago and right-sided abdominal pain for the past 3 months with constipation.  Labs at that time ordered CBC and CMP as well as urinalysis.  She was given Bactrim for trace leukocytes.  CMP with minimally elevated ALT at 53 and CBC with elevated white count at 13.1    07/12/2021 patient had abdominal ultrasound.  This showed status postcholecystectomy, fatty liver and trace ascites.    07/13/2021 patient seen in clinic and continued to complain of right-sided abdominal pain which is unchanged even after cholecystectomy.  Constant and rated as an 8/10.  Had been on Amitiza which helped with her constipation but continued with abdominal pain.  At that point we are waiting on results from ultrasound as above to consider CT.    07/17/2021 CT renal stone study showed no acute findings.  Status postcholecystectomy.    07/27/2021 CT of the abdomen pelvis with contrast with no acute abnormality and mild hepatic steatosis.  At that time we trialed an antispasmodic, she was given Dicyclomine 10 mg 4 times daily.    10/19/2021 patient seen in clinic and was doing well on Dicyclomine 4 times a day.  He was thought this was likely IBS and we continued the 10 mg of dicyclomine.    09/29/2022 patient seen in the urgent care for  abdominal pain.  Also discussed constipation nausea and vomiting.  At that time recommended to go to the ER given no improvement after Zofran.  ER workup with an ALT of 66, CBC with a white count of 11.4, lipase normal, STD check negative.  Patient given morphine and Zofran.  CT abdomen pelvis showed interval development of shotty mesenteric adenopathy which is nonspecific possibly reactive or inflammatory has been seen with enterocolitis or mesenteric adenitis and moderate hepatic steatosis.  Patient was given Ceftriaxone, Doxycycline and Metronidazole x 2 weeks for PID.    01/23/2023 patient seen in clinic with her sister who acted as her interpreter.  Did not recall ever getting benefit from Dicyclomine 10 mg 4 times daily.  Though she had previously reported doing some better, she is stopped a month ago and none of her symptoms had changed.  Chronic bilateral pain in her sides which is worse when she is walking around or changing positions.  That time question whether pain was musculoskeletal.  Increase Dicyclomine to 20 4 times daily, explained that if that was not helpful then would recommend a colonoscopy and possible referral to Terrilee Files at sports medicine.    02/12/2023 patient saw OB/GYN discussed dyspareunia.  Findings consistent with anal pelvic myalgia and they recommended pelvic physical therapy.    Today, patient presents to clinic accompanied by interpreter.  She continues with her chronic bilateral abdominal pain mostly on the sides of her abdomen as well as a  new pinching cramping pain in her epigastrium that starts about 10 minutes after she eats or drinks anything over the past couple of months.  No weight loss associated with this.  She is also seeing pelvic floor physical therapy as above but it does not seem to be helping.  Currently taking Dicyclomine 20 mg 4 times daily with no help.  Symptoms are still some worse when she moves around.    Denies fever, chills or weight loss.  Past  Medical History:  Diagnosis Date   Medical history non-contributory     Past Surgical History:  Procedure Laterality Date   CHOLECYSTECTOMY     NO PAST SURGERIES      Current Outpatient Medications  Medication Sig Dispense Refill   dicyclomine (BENTYL) 20 MG tablet Take 1 tablet (20 mg total) by mouth 4 (four) times daily -  before meals and at bedtime. 120 tablet 2   doxycycline (VIBRAMYCIN) 100 MG capsule Take 1 capsule (100 mg total) by mouth 2 (two) times daily. 20 capsule 0   ibuprofen (ADVIL) 600 MG tablet Take 1 tablet (600 mg total) by mouth every 8 (eight) hours as needed. 60 tablet 1   Misc. Devices MISC Please supply with size M adult diapers, gloves and wipes. ICD 10 N39.3 1 each 0   senna-docusate (SENOKOT-S) 8.6-50 MG tablet Take 2 tablets by mouth daily. (Patient not taking: Reported on 07/04/2023) 60 tablet 6   Vitamin D, Ergocalciferol, (DRISDOL) 1.25 MG (50000 UNIT) CAPS capsule Take 1 capsule (50,000 Units total) by mouth every 7 (seven) days. Please fill as a 90 day supply (Patient not taking: Reported on 07/04/2023) 12 capsule 0   No current facility-administered medications for this visit.    Allergies as of 07/24/2023   (No Known Allergies)    Family History  Problem Relation Age of Onset   Healthy Mother    Rheum arthritis Maternal Grandmother    Rheum arthritis Maternal Grandfather    Asthma Paternal Grandmother    Rheum arthritis Sister    Rheum arthritis Brother    Hearing loss Neg Hx    Colon cancer Neg Hx    Stomach cancer Neg Hx    Pancreatic cancer Neg Hx     Social History   Socioeconomic History   Marital status: Married    Spouse name: Not on file   Number of children: 2   Years of education: Not on file   Highest education level: Not on file  Occupational History   Occupation: aide  Tobacco Use   Smoking status: Never   Smokeless tobacco: Never  Vaping Use   Vaping status: Never Used  Substance and Sexual Activity   Alcohol  use: No   Drug use: No   Sexual activity: Yes    Birth control/protection: None  Other Topics Concern   Not on file  Social History Narrative   Not on file   Social Drivers of Health   Financial Resource Strain: Not on file  Food Insecurity: No Food Insecurity (06/08/2018)   Hunger Vital Sign    Worried About Running Out of Food in the Last Year: Never true    Ran Out of Food in the Last Year: Never true  Transportation Needs: No Transportation Needs (06/08/2018)   PRAPARE - Administrator, Civil Service (Medical): No    Lack of Transportation (Non-Medical): No  Physical Activity: Not on file  Stress: Not on file  Social Connections: Not on file  Intimate Partner Violence: Not on file    Review of Systems:    Constitutional: No weight loss, fever or chills Cardiovascular: No chest pain, chest pressure or palpitations   Respiratory: No SOB or cough Gastrointestinal: See HPI and otherwise negative   Physical Exam:  Vital signs: BP 124/86 (Cuff Size: Normal)   Pulse (!) 105   Ht 4\' 11"  (1.499 m)   Wt 157 lb 12.8 oz (71.6 kg)   LMP 07/02/2023 (Exact Date)   BMI 31.87 kg/m    Constitutional:   Pleasant Nepali female appears to be in NAD, Well developed, Well nourished, alert and cooperative Respiratory: Respirations even and unlabored. Lungs clear to auscultation bilaterally.   No wheezes, crackles, or rhonchi.  Cardiovascular: Normal S1, S2. No MRG. Regular rate and rhythm. No peripheral edema, cyanosis or pallor.  Gastrointestinal:  Soft, nondistended, mild TTP to palpation on either side of her abdomen. No rebound or guarding. Normal bowel sounds. No appreciable masses or hepatomegaly. Rectal:  Not performed.  Psychiatric: Demonstrates good judgement and reason without abnormal affect or behaviors.  RELEVANT LABS AND IMAGING: CBC    Component Value Date/Time   WBC 11.4 (H) 09/29/2022 1744   RBC 4.67 09/29/2022 1744   HGB 12.3 09/29/2022 1744   HGB 11.9  06/24/2022 1631   HCT 39.3 09/29/2022 1744   HCT 37.1 06/24/2022 1631   PLT 466 (H) 09/29/2022 1744   PLT 400 06/24/2022 1631   MCV 84.2 09/29/2022 1744   MCV 83 06/24/2022 1631   MCH 26.3 09/29/2022 1744   MCHC 31.3 09/29/2022 1744   RDW 13.9 09/29/2022 1744   RDW 13.5 06/24/2022 1631   LYMPHSABS 3.4 (H) 06/24/2022 1631   MONOABS 0.6 01/11/2022 1315   EOSABS 0.5 (H) 06/24/2022 1631   BASOSABS 0.1 06/24/2022 1631    CMP     Component Value Date/Time   NA 138 09/29/2022 1744   NA 141 06/04/2022 1043   K 3.6 09/29/2022 1744   CL 104 09/29/2022 1744   CO2 24 09/29/2022 1744   GLUCOSE 88 09/29/2022 1744   BUN 7 09/29/2022 1744   BUN 10 06/04/2022 1043   CREATININE 0.61 09/29/2022 1744   CREATININE 0.61 01/11/2022 1315   CREATININE 0.73 12/18/2015 1634   CALCIUM 8.7 (L) 09/29/2022 1744   PROT 7.0 09/29/2022 1744   PROT 6.8 06/04/2022 1043   ALBUMIN 3.9 09/29/2022 1744   ALBUMIN 4.3 06/04/2022 1043   AST 35 09/29/2022 1744   AST 21 01/11/2022 1315   ALT 66 (H) 09/29/2022 1744   ALT 32 01/11/2022 1315   ALKPHOS 80 09/29/2022 1744   BILITOT 0.3 09/29/2022 1744   BILITOT 0.2 06/04/2022 1043   BILITOT 0.4 01/11/2022 1315   GFRNONAA >60 09/29/2022 1744   GFRNONAA >60 01/11/2022 1315   GFRNONAA >89 09/20/2014 1538   GFRAA 144 08/03/2019 1029   GFRAA >89 09/20/2014 1538    Assessment: 1.  Chronic abdominal pain: For the past 2 years, CT and other imaging unrevealing, no help with Dicyclomine, some increase with positional change; consider IBD versus IBS versus musculoskeletal etiology 2.  Epigastric pain: Pinching cramping pain that starts 10 minutes after eating or drinking anything; consider gastritis versus functional dyspepsia versus other  Plan: 1.  Scheduled patient for a diagnostic EGD and colonoscopy in the [facility] with Dr. Lavon Paganini.  This is for chronic pain that has been ongoing for at least 2 years.  To provide the patient a detailed list of risks with procedures  and  she agrees to proceed. Patient is appropriate for endoscopic procedure(s) in the ambulatory ([facility]) setting.  2.  If above is normal would recommend referral to Dr. Terrilee Files with sports medicine for likely musculoskeletal pain. 3.  Patient can discontinue Dicyclomine 4.  Patient to follow in clinic per recommendations after time of procedures.  Hyacinth Meeker, PA-C [facility] 07/24/2023, 3:14 PM  Cc: Claiborne Rigg, NP

## 2023-07-24 NOTE — Patient Instructions (Addendum)
 You have been scheduled for an endoscopy and colonoscopy. Please follow the written instructions given to you at your visit today.  If you use inhalers (even only as needed), please bring them with you on the day of your procedure.  DO NOT TAKE 7 DAYS PRIOR TO TEST- Trulicity (dulaglutide) Ozempic, Wegovy (semaglutide) Mounjaro (tirzepatide) Bydureon Bcise (exanatide extended release)  DO NOT TAKE 1 DAY PRIOR TO YOUR TEST Rybelsus (semaglutide) Adlyxin (lixisenatide) Victoza (liraglutide) Byetta (exanatide) __________________________________________________________________________  Due to recent changes in healthcare laws, you may see the results of your imaging and laboratory studies on MyChart before your provider has had a chance to review them.  We understand that in some cases there may be results that are confusing or concerning to you. Not all laboratory results come back in the same time frame and the provider may be waiting for multiple results in order to interpret others.  Please give [place] 48 hours in order for your provider to thoroughly review all the results before contacting the office for clarification of your results.  _______________________________________________________  If your blood pressure at your visit was 140/90 or greater, please contact your primary care physician to follow up on this.  _______________________________________________________  If you are age 81 or older, your body mass index should be between 23-30. Your Body mass index is 31.87 kg/m. If this is out of the aforementioned range listed, please consider follow up with your Primary Care Provider.  If you are age 36 or younger, your body mass index should be between 19-25. Your Body mass index is 31.87 kg/m. If this is out of the aformentioned range listed, please consider follow up with your Primary Care Provider.   ________________________________________________________  The [facility]  providers would like to encourage you to use [facility] to communicate with providers for non-urgent requests or questions.  Due to long hold times on the telephone, sending your provider a message by [facility] may be a faster and more efficient way to get a response.  Please allow 48 business hours for a response.  Please remember that this is for non-urgent requests.  _______________________________________________________

## 2023-07-29 ENCOUNTER — Ambulatory Visit (HOSPITAL_COMMUNITY)
Admission: RE | Admit: 2023-07-29 | Discharge: 2023-07-29 | Disposition: A | Discharge status: Home / Self Care | Source: Ambulatory Visit | Attending: Family Medicine | Admitting: Family Medicine

## 2023-07-29 ENCOUNTER — Ambulatory Visit (INDEPENDENT_AMBULATORY_CARE_PROVIDER_SITE_OTHER)

## 2023-07-29 ENCOUNTER — Encounter (HOSPITAL_COMMUNITY): Payer: Self-pay

## 2023-07-29 VITALS — BP 106/68 | HR 82 | Temp 98.0°F | Resp 18

## 2023-07-29 DIAGNOSIS — R1084 Generalized abdominal pain: Secondary | ICD-10-CM | POA: Diagnosis not present

## 2023-07-29 DIAGNOSIS — R109 Unspecified abdominal pain: Secondary | ICD-10-CM | POA: Diagnosis not present

## 2023-07-29 NOTE — ED Triage Notes (Signed)
 Pt states she has had abdominal pain over two years, she seen GI for this pain on 07/24/2023. They have scheduled pt for colonoscopy and EGD next month. They advised her to take tylenol as needed but she states its not helping and she would like to know if there is anything to do about this pain. She is taking bentyl QID but states it doesn't help either.

## 2023-07-29 NOTE — Discharge Instructions (Addendum)
 You were seen today for chronic abdominal pain.  As discussed, given the chronic nature of your pain, and the extensive work up you have had and plan to have, you  need to follow up with your gastroenterologist for your continued pain.  Your xray does not show any abnormality today.    Again, please call your gastroenterologist for further discussion of your pain.

## 2023-07-29 NOTE — ED Provider Notes (Signed)
 [facility]    CSN: [id number] Arrival date & time: 07/29/23  1458      History   Chief Complaint Chief Complaint  Patient presents with   Abdominal Pain    Entered by patient    HPI Kelsey Davies is a 33 y.o. female.    Abdominal Pain  She is here for abdominal pain.  This is the same abdominal pain she has had for 4 years, for which she has seen GI.  (See note dated 07/24/23 for full summary).  At that visit she was started on bentyl, but not helping.  She continues with pain to the right and left abdomen, as well as lower abdomen.  She is scheduled for EGD/colonoscopy in about a month.  Denies any urinary symptoms.  No n/v currently.         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 Authorizing Provider  dicyclomine (BENTYL) 20 MG tablet Take 1 tablet (20 mg total) by mouth 4 (four) times daily -  before meals and at bedtime. 01/23/23  Yes Unk Lightning, PA  doxycycline (VIBRAMYCIN) 100 MG capsule Take 1 capsule (100 mg total) by mouth 2 (two) times daily. 09/29/22  Yes Loetta Rough, MD  ibuprofen (ADVIL) 600 MG tablet Take 1 tablet (600 mg total) by mouth every 8 (eight) hours  as needed. 07/04/23  Yes Claiborne Rigg, NP  Misc. Devices MISC Please supply with size M adult diapers, gloves and wipes. ICD 10 N39.3 09/18/21  Yes Claiborne Rigg, NP  senna-docusate (SENOKOT-S) 8.6-50 MG tablet Take 2 tablets by mouth daily. 06/03/22  Yes Claiborne Rigg, NP  Vitamin D, Ergocalciferol, (DRISDOL) 1.25 MG (50000 UNIT) CAPS capsule Take 1 capsule (50,000 Units total) by mouth every 7 (seven) days. Please fill as a 90 day supply 08/07/22  Yes Claiborne Rigg, NP    Family History Family History  Problem Relation Age of Onset   Healthy Mother    Rheum arthritis Maternal Grandmother    Rheum arthritis Maternal Grandfather    Asthma Paternal Grandmother    Rheum arthritis Sister    Rheum arthritis Brother    Hearing loss Neg Hx    Colon cancer Neg Hx    Stomach cancer Neg Hx    Pancreatic cancer Neg Hx     Social History Social History   Tobacco Use  Smoking status: Never   Smokeless tobacco: Never  Vaping Use   Vaping status: Never Used  Substance Use Topics   Alcohol use: No   Drug use: No     Allergies   Patient has no known allergies.   Review of Systems Review of Systems  Constitutional: Negative.   HENT: Negative.    Respiratory: Negative.    Gastrointestinal:  Positive for abdominal pain.  Genitourinary: Negative.   Musculoskeletal: Negative.      Physical Exam Triage Vital Signs ED Triage Vitals  Encounter Vitals Group     BP 07/29/23 1517 106/68     Systolic BP Percentile --      Diastolic BP Percentile --      Pulse Rate 07/29/23 1517 82     Resp 07/29/23 1517 18     Temp 07/29/23 1517 98 F (36.7 C)     Temp Source 07/29/23 1517 Oral     SpO2 07/29/23 1517 97 %     Weight --      Height --      Head Circumference --      Peak Flow --      Pain Score 07/29/23 1516 8     Pain Loc --      Pain Education --      Exclude from Growth Chart --    No data found.  Updated Vital Signs BP 106/68 (BP Location: Left Arm)    Pulse 82   Temp 98 F (36.7 C) (Oral)   Resp 18   LMP 07/02/2023 (Exact Date)   SpO2 97%   Visual Acuity Right Eye Distance:   Left Eye Distance:   Bilateral Distance:    Right Eye Near:   Left Eye Near:    Bilateral Near:     Physical Exam Constitutional:      General: She is not in acute distress.    Appearance: She is well-developed and normal weight. She is not ill-appearing or toxic-appearing.  Cardiovascular:     Rate and Rhythm: Normal rate.  Pulmonary:     Effort: Pulmonary effort is normal.  Abdominal:     General: Bowel sounds are normal.     Palpations: Abdomen is soft.     Tenderness: There is abdominal tenderness in the right upper quadrant, right lower quadrant, suprapubic area, left upper quadrant and left lower quadrant. There is no guarding or rebound.  Neurological:     Mental Status: She is alert.      UC Treatments / Results  Labs (all labs ordered are listed, but only abnormal results are displayed) Labs Reviewed - No data to display  EKG   Radiology No results found.  Procedures Procedures (including critical care time)  Medications Ordered in UC Medications - No data to display  Initial Impression / Assessment and Plan / UC Course  I have reviewed the triage vital signs and the nursing notes.  Pertinent labs & imaging results that were available during my care of the patient were reviewed by me and considered in my medical decision making (see chart for details).     Patient is here for chronic, continued abdominal pain.  Discussed that in the urgent care, all I could offer was an xray, which likely would not show anything new.  However, patient asked that this be done today.   Final Clinical Impressions(s) / UC Diagnoses   Final diagnoses:  Generalized abdominal pain     Discharge Instructions  You were seen today for chronic abdominal pain.  As discussed, given the chronic nature of your pain, and the extensive work  up you have had and plan to have, you  need to follow up with your gastroenterologist for your continued pain.  Your xray does not show any abnormality today.    Again, please call your gastroenterologist for further discussion of your pain.     ED Prescriptions   None    PDMP not reviewed this encounter.   Jannifer Franklin, MD 07/29/23 [phone]

## 2023-08-12 ENCOUNTER — Encounter (HOSPITAL_COMMUNITY): Payer: Self-pay

## 2023-08-12 ENCOUNTER — Ambulatory Visit (HOSPITAL_COMMUNITY)
Admission: RE | Admit: 2023-08-12 | Discharge: 2023-08-12 | Disposition: A | Discharge status: Home / Self Care | Source: Ambulatory Visit | Attending: Neurology | Admitting: Neurology

## 2023-08-12 VITALS — BP 110/74 | HR 103 | Temp 98.2°F | Resp 18

## 2023-08-12 DIAGNOSIS — B349 Viral infection, unspecified: Secondary | ICD-10-CM | POA: Diagnosis not present

## 2023-08-12 DIAGNOSIS — N943 Premenstrual tension syndrome: Secondary | ICD-10-CM

## 2023-08-12 MED ORDER — ALBUTEROL SULFATE HFA 108 (90 BASE) MCG/ACT IN AERS
INHALATION_SPRAY | RESPIRATORY_TRACT | Status: AC
  Filled 2023-08-12: qty 6.7

## 2023-08-12 MED ORDER — ALBUTEROL SULFATE HFA 108 (90 BASE) MCG/ACT IN AERS
2.0000 | INHALATION_SPRAY | Freq: Once | RESPIRATORY_TRACT | Status: AC
  Administered 2023-08-12: 2 via RESPIRATORY_TRACT

## 2023-08-12 MED ORDER — ACETAMINOPHEN 325 MG PO TABS
975.0000 mg | ORAL_TABLET | Freq: Once | ORAL | Status: AC
  Administered 2023-08-12: 975 mg via ORAL

## 2023-08-12 MED ORDER — IBUPROFEN 600 MG PO TABS
600.0000 mg | ORAL_TABLET | Freq: Three times a day (TID) | ORAL | 1 refills | Status: DC | PRN
Start: 2023-08-12 — End: 2023-09-07

## 2023-08-12 MED ORDER — ACETAMINOPHEN 325 MG PO TABS
ORAL_TABLET | ORAL | Status: AC
  Filled 2023-08-12: qty 3

## 2023-08-12 MED ORDER — ACETAMINOPHEN 500 MG PO TABS: 500.0000 mg | ORAL_TABLET | Freq: Four times a day (QID) | ORAL | 0 refills | Status: DC | PRN

## 2023-08-12 MED ORDER — GUAIFENESIN 100 MG/5ML PO LIQD: 100.0000 mg | ORAL | 0 refills | Status: DC | PRN

## 2023-08-12 NOTE — ED Provider Notes (Signed)
 [facility]    CSN: [id number] Arrival date & time: 08/12/23  1150      History   Chief Complaint Chief Complaint  Patient presents with   Extremity Weakness    headache, chest pain and difficult breathing - Entered by patient    HPI Kelsey Davies is a 33 y.o. female.   Symptoms initially started Thursday night. She initially started having chest pain with difficulty breathing. She reports a fever yesterday of 102F. She has been afebrile today. She then started having the headache and dizziness. She did take ibuprofen  this morning.  She has a documented abnormal EOM on previous visits.  She does not have a history of dizziness or migraine.  No sick contacts.  No nausea or vomiting.  Appetite has been normal.  She denies a cough at night, however in the room she does have a slight cough.  The history is provided by the patient and a relative.  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 Authorizing Provider  acetaminophen  (TYLENOL ) 500 MG tablet Take 1 tablet (500 mg total) by mouth every 6 (six) hours as needed. 08/12/23  Yes Erric Machnik, Sim Dryer, NP  guaiFENesin  (ROBITUSSIN) 100  MG/5ML liquid Take 5-10 mLs (100-200 mg total) by mouth every 4 (four) hours as needed for cough or to loosen phlegm. 08/12/23  Yes Imogene Mana, NP  ibuprofen  (ADVIL ) 600 MG tablet Take 1 tablet (600 mg total) by mouth every 8 (eight) hours as needed. 08/12/23   Imogene Mana, NP  Misc. Devices MISC Please supply with size M adult diapers, gloves and wipes. ICD 10 N39.3 09/18/21   Fleming, Zelda W, NP  senna-docusate (SENOKOT-S) 8.6-50 MG tablet Take 2 tablets by mouth daily. 06/03/22   Fleming, Zelda W, NP  Vitamin D , Ergocalciferol , (DRISDOL ) 1.25 MG (50000 UNIT) CAPS capsule Take 1 capsule (50,000 Units total) by mouth every 7 (seven) days. Please fill as a 90 day supply 08/07/22   Collins Dean, NP    Family History Family History  Problem Relation Age of Onset   Healthy Mother    Rheum arthritis Maternal Grandmother    Rheum arthritis Maternal Grandfather    Asthma Paternal Grandmother    Rheum arthritis Sister    Rheum arthritis Brother    Hearing loss Neg Hx    Colon cancer Neg Hx    Stomach  cancer Neg Hx    Pancreatic cancer Neg Hx     Social History Social History   Tobacco Use   Smoking status: Never   Smokeless tobacco: Never  Vaping Use   Vaping status: Never Used  Substance Use Topics   Alcohol use: No   Drug use: No     Allergies   Patient has no known allergies.   Review of Systems Review of Systems  Musculoskeletal:  Positive for extremity weakness.     Physical Exam Triage Vital Signs ED Triage Vitals [08/12/23 1216]  Encounter Vitals Group     BP 110/74     Systolic BP Percentile      Diastolic BP Percentile      Pulse Rate (!) 103     Resp 18     Temp 98.2 F (36.8 C)     Temp Source Oral     SpO2 98 %     Weight      Height      Head Circumference      Peak Flow      Pain Score 10     Pain Loc      Pain Education      Exclude from Growth Chart    No data found.  Updated Vital Signs BP 110/74 (BP Location: Right Arm)   Pulse  (!) 103   Temp 98.2 F (36.8 C) (Oral)   Resp 18   LMP 08/02/2023 (Exact Date)   SpO2 98%   Visual Acuity Right Eye Distance:   Left Eye Distance:   Bilateral Distance:    Right Eye Near:   Left Eye Near:    Bilateral Near:     Physical Exam Vitals and nursing note reviewed.  Constitutional:      General: She is not in acute distress.    Appearance: She is well-developed.  HENT:     Head: Normocephalic and atraumatic.  Eyes:     Conjunctiva/sclera: Conjunctivae normal.  Cardiovascular:     Rate and Rhythm: Normal rate and regular rhythm.     Heart sounds: No murmur heard. Pulmonary:     Effort: Pulmonary effort is normal. No respiratory distress.     Breath sounds: Normal breath sounds.  Abdominal:     Palpations: Abdomen is soft.     Tenderness: There is no abdominal tenderness.  Musculoskeletal:        General: No swelling.     Cervical back: Neck supple.  Skin:    General: Skin is warm and dry.     Capillary Refill: Capillary refill takes less than 2 seconds.  Neurological:     Mental Status: She is alert and oriented to person, place, and time.     Comments: Abnormal EOM.  No horizontal eye movement.   Psychiatric:        Mood and Affect: Mood normal.      UC Treatments / Results  Labs (all labs ordered are listed, but only abnormal results are displayed) Labs Reviewed - No data to display  EKG   Radiology No results found.  Procedures Procedures (including critical care time)  Medications Ordered in UC Medications  acetaminophen  (TYLENOL ) tablet 975 mg (975 mg Oral Given 08/12/23 1406)  albuterol  (VENTOLIN  HFA) 108 (90 Base) MCG/ACT inhaler 2 puff (2 puffs Inhalation Given 08/12/23 1406)    Initial Impression / Assessment and Plan / UC Course  I have reviewed the triage vital signs and the nursing notes.  Pertinent  labs & imaging results that were available during my care of the patient were reviewed by me and considered in my medical  decision making (see chart for details). Suspect viral URI, viral syndrome.  Strep/viral testing: deferred due to timing   Physical exam findings reassuring, vital signs hemodynamically stable, and lungs clear, therefore deferred imaging of the chest.  Advised supportive care/prescriptions for symptomatic relief as outlined in AVS.   Final Clinical Impressions(s) / UC Diagnoses   Final diagnoses:  Viral illness     Discharge Instructions      You likely have a viral illness which will improve on its own with rest, fluids, and medications to help with your symptoms. Tylenol , ibuprofen , guaifenesin  (plain mucinex ), and saline nasal sprays may help relieve symptoms.  Two teaspoons of honey in 1 cup of warm water every 4-6 hours may help with throat pains. Humidifier in room at nighttime may help soothe cough (clean well daily).  May continue using inhaler for shortness of breath as well. For chest pain, shortness of breath, inability to keep food or fluids down without vomiting, fever that does not respond to tylenol  or motrin , or any other severe symptoms, please go to the ER for further evaluation. Return to urgent care as needed, otherwise follow-up with PCP.         ED Prescriptions     Medication Sig Dispense Auth. Provider   acetaminophen  (TYLENOL ) 500 MG tablet Take 1 tablet (500 mg total) by mouth every 6 (six) hours as needed. 30 tablet Imogene Mana, NP   ibuprofen  (ADVIL ) 600 MG tablet Take 1 tablet (600 mg total) by mouth every 8 (eight) hours as needed. 60 tablet Dela Favor, [place], NP   guaiFENesin  (ROBITUSSIN) 100 MG/5ML liquid Take 5-10 mLs (100-200 mg total) by mouth every 4 (four) hours as needed for cough or to loosen phlegm. 60 mL Imogene Mana, NP      PDMP not reviewed this encounter.   Imogene Mana, NP 08/12/23 1420

## 2023-08-12 NOTE — Discharge Instructions (Addendum)
 You likely have a viral illness which will improve on its own with rest, fluids, and medications to help with your symptoms. Tylenol , ibuprofen , guaifenesin  (plain mucinex ), and saline nasal sprays may help relieve symptoms.  Two teaspoons of honey in 1 cup of warm water every 4-6 hours may help with throat pains. Humidifier in room at nighttime may help soothe cough (clean well daily).  May continue using inhaler for shortness of breath as well. For chest pain, shortness of breath, inability to keep food or fluids down without vomiting, fever that does not respond to tylenol  or motrin , or any other severe symptoms, please go to the ER for further evaluation. Return to urgent care as needed, otherwise follow-up with PCP.

## 2023-08-12 NOTE — ED Triage Notes (Signed)
 Pt c/o headache, dizziness, SOB, and center chest pain x3 days. NAD. Took ibuprofen  yesterday with no relief.

## 2023-08-27 ENCOUNTER — Ambulatory Visit (AMBULATORY_SURGERY_CENTER): Admitting: Gastroenterology

## 2023-08-27 ENCOUNTER — Encounter: Payer: Self-pay | Admitting: Gastroenterology

## 2023-08-27 VITALS — BP 94/62 | HR 79 | Temp 98.2°F | Resp 15 | Ht 59.0 in | Wt 157.0 lb

## 2023-08-27 DIAGNOSIS — Z3202 Encounter for pregnancy test, result negative: Secondary | ICD-10-CM | POA: Diagnosis not present

## 2023-08-27 DIAGNOSIS — G8929 Other chronic pain: Secondary | ICD-10-CM

## 2023-08-27 DIAGNOSIS — R1013 Epigastric pain: Secondary | ICD-10-CM | POA: Diagnosis not present

## 2023-08-27 DIAGNOSIS — K648 Other hemorrhoids: Secondary | ICD-10-CM | POA: Diagnosis not present

## 2023-08-27 DIAGNOSIS — K644 Residual hemorrhoidal skin tags: Secondary | ICD-10-CM | POA: Diagnosis not present

## 2023-08-27 DIAGNOSIS — R935 Abnormal findings on diagnostic imaging of other abdominal regions, including retroperitoneum: Secondary | ICD-10-CM

## 2023-08-27 LAB — POCT URINE PREGNANCY: Preg Test, Ur: NEGATIVE

## 2023-08-27 MED ORDER — SODIUM CHLORIDE 0.9 % IV SOLN: 500.0000 mL | Freq: Once | INTRAVENOUS | Status: AC

## 2023-08-27 NOTE — Progress Notes (Unsigned)
 [facility] History and Physical   Primary Care Physician:  Collins Dean, NP   Reason for Procedure:  Chronic generalized abdominal pain  Plan:    EGD and colonoscopy with possible interventions as needed     HPI: Kelsey Davies is a very pleasant 33 y.o. female here for EGD and colonoscopy for generalized abdominal pain. R/o IBD or neoplastic lesion.  Abnormal Ct abd & pelvis with shotty mesenteric adenopathy, non specific  ?reactive inflammatory  Please refer to office visit note by Reginal Capra for additional details   The risks and benefits as well as alternatives of endoscopic procedure(s) have been discussed and reviewed. All questions answered. The patient agrees to proceed.    Past Medical History:  Diagnosis Date   Medical history non-contributory     Past Surgical History:  Procedure Laterality Date   CHOLECYSTECTOMY     NO PAST SURGERIES      Prior to Admission medications   Medication Sig Start Date End Date Taking? Authorizing Provider  acetaminophen  (TYLENOL ) 500 MG tablet Take 1 tablet (500 mg total) by mouth every 6 (six) hours as needed. 08/12/23   Imogene Mana, NP  guaiFENesin  (ROBITUSSIN) 100 MG/5ML liquid Take 5-10 mLs (100-200 mg total) by mouth every 4 (four) hours as needed for cough or to loosen phlegm. 08/12/23   Imogene Mana, NP  ibuprofen  (ADVIL ) 600 MG tablet Take 1 tablet (600 mg total) by mouth every 8 (eight) hours as needed. 08/12/23   Imogene Mana, NP  Misc. Devices MISC Please supply with size M adult diapers, gloves and wipes. ICD 10 N39.3 09/18/21   Fleming, Zelda W, NP  senna-docusate (SENOKOT-S) 8.6-50 MG tablet Take 2 tablets by mouth daily. 06/03/22   Fleming, Zelda W, NP  Vitamin D , Ergocalciferol , (DRISDOL ) 1.25 MG (50000 UNIT) CAPS capsule Take 1 capsule (50,000 Units total) by mouth every 7 (seven) days. Please fill as a 90 day supply 08/07/22   Fleming, Zelda W, NP    Current Outpatient Medications   Medication Sig Dispense Refill   acetaminophen  (TYLENOL ) 500 MG tablet Take 1 tablet (500 mg total) by mouth every 6 (six) hours as needed. 30 tablet 0   guaiFENesin  (ROBITUSSIN) 100 MG/5ML liquid Take 5-10 mLs (100-200 mg total) by mouth every 4 (four) hours as needed for cough or to loosen phlegm. 60 mL 0   ibuprofen  (ADVIL ) 600 MG tablet Take 1 tablet (600 mg total) by mouth every 8 (eight) hours as needed. 60 tablet 1   Misc. Devices MISC Please supply with size M adult diapers, gloves and wipes. ICD 10 N39.3 1 each 0   senna-docusate (SENOKOT-S) 8.6-50 MG tablet Take 2 tablets by mouth daily. 60 tablet 6   Vitamin D , Ergocalciferol , (DRISDOL ) 1.25 MG (50000 UNIT) CAPS capsule Take 1 capsule (50,000 Units total) by mouth every 7 (seven) days. Please fill as a 90 day supply 12 capsule 0   Current Facility-Administered Medications  Medication Dose Route Frequency Provider Last Rate Last Admin   0.9 %  sodium chloride  infusion  500 mL Intravenous Once Georgia Baria V, MD        Allergies as of 08/27/2023   (No Known Allergies)    Family History  Problem Relation Age of Onset   Healthy Mother    Rheum arthritis Sister    Rheum arthritis Brother    Rheum arthritis Maternal Grandmother    Rheum arthritis Maternal Grandfather    Asthma Paternal Grandmother    Hearing loss  Neg Hx    Colon cancer Neg Hx    Stomach cancer Neg Hx    Pancreatic cancer Neg Hx    Colon polyps Neg Hx    Esophageal cancer Neg Hx     Social History   Socioeconomic History   Marital status: Married    Spouse name: Not on file   Number of children: 2   Years of education: Not on file   Highest education level: Not on file  Occupational History   Occupation: aide  Tobacco Use   Smoking status: Never   Smokeless tobacco: Never  Vaping Use   Vaping status: Never Used  Substance and Sexual Activity   Alcohol use: No   Drug use: No   Sexual activity: Yes    Birth control/protection: None  Other  Topics Concern   Not on file  Social History Narrative   Not on file   Social Drivers of Health   Financial Resource Strain: Not on file  Food Insecurity: No Food Insecurity (06/08/2018)   Hunger Vital Sign    Worried About Running Out of Food in the Last Year: Never true    Ran Out of Food in the Last Year: Never true  Transportation Needs: No Transportation Needs (06/08/2018)   PRAPARE - Administrator, Civil Service (Medical): No    Lack of Transportation (Non-Medical): No  Physical Activity: Not on file  Stress: Not on file  Social Connections: Not on file  Intimate Partner Violence: Not on file    Review of Systems:  All other review of systems negative except as mentioned in the HPI.  Physical Exam: Vital signs in last 24 hours: BP 106/73   Pulse (!) 104   Temp 98.2 F (36.8 C)   Ht 4\' 11"  (1.499 m)   Wt 157 lb (71.2 kg)   LMP 08/02/2023 (Exact Date)   SpO2 98%   BMI 31.71 kg/m  General:   Alert, NAD Lungs:  Clear .   Heart:  Regular rate and rhythm Abdomen:  Soft, nontender and nondistended. Neuro/Psych:  Alert and cooperative. Normal mood and affect. A and O x 3  Reviewed labs, radiology imaging, old records and pertinent past GI work up  Patient is appropriate for planned procedure(s) and anesthesia in an ambulatory setting   K. Veena Kielee Care , MD [phone]

## 2023-08-27 NOTE — Progress Notes (Unsigned)
 Pt's states no medical or surgical changes since previsit or office visit.   Interpreter services used. Interpreter name: Tara

## 2023-08-27 NOTE — Progress Notes (Unsigned)
1456 Robinul 0.1 mg IV given due large amount of secretions upon assessment.  MD made aware, vss

## 2023-08-27 NOTE — Op Note (Signed)
 [facility] Patient Name: Kelsey Davies Procedure Date: 08/27/2023 2:53 PM MRN: [id number] Endoscopist: Sergio Dandy , MD, [id number] Age: 33 Referring MD:  Date of Birth: 07/02/1990 Gender: Female Account #: [id number] Procedure:                Colonoscopy Indications:              Generalized abdominal pain, Abnormal CT of the GI                            tract Medicines:                Monitored Anesthesia Care Procedure:                Pre-Anesthesia Assessment:                           - Prior to the procedure, a History and Physical                            was performed, and patient medications and                            allergies were reviewed. The patient's tolerance of                            previous anesthesia was also reviewed. The risks                            and benefits of the procedure and the sedation                            options and risks were discussed with the patient.                            All questions were answered, and informed consent                            was obtained. Prior Anticoagulants: The patient has                            taken no anticoagulant or antiplatelet agents. ASA                            Grade Assessment: I - A normal, healthy patient.                            After reviewing the risks and benefits, the patient                            was deemed in satisfactory condition to undergo the                            procedure.  After obtaining informed consent, the colonoscope                            was passed under direct vision. Throughout the                            procedure, the patient's blood pressure, pulse, and                            oxygen saturations were monitored continuously. The                            [id number]-HQ190L Colonoscope [id number] was introduced                            through the anus and advanced to the the cecum,                             identified by appendiceal orifice and ileocecal                            valve. The colonoscopy was performed without                            difficulty. The patient tolerated the procedure                            well. The quality of the bowel preparation was                            adequate. The terminal ileum, ileocecal valve,                            appendiceal orifice, and rectum were photographed. Scope In: 3:04:29 PM Scope Out: 3:17:06 PM Scope Withdrawal Time: 0 hours 9 minutes 15 seconds  Total Procedure Duration: 0 hours 12 minutes 37 seconds  Findings:                 The perianal and digital rectal examinations were                            normal.                           Non-bleeding external and internal hemorrhoids were                            found during retroflexion. The hemorrhoids were                            small.                           The exam was otherwise without abnormality.  The terminal ileum appeared normal. Complications:            No immediate complications. Estimated Blood Loss:     Estimated blood loss was minimal. Impression:               - Non-bleeding external and internal hemorrhoids.                           - The examination was otherwise normal.                           - The examined portion of the ileum was normal.                           - No specimens collected. Recommendation:           - Patient has a contact number available for                            emergencies. The signs and symptoms of potential                            delayed complications were discussed with the                            patient. Return to normal activities tomorrow.                            Written discharge instructions were provided to the                            patient.                           - Resume previous diet.                           - Continue present medications.                            - Repeat colonoscopy at age 77 for surveillance. Zalma Channing V. Kennon Encinas, MD 08/27/2023 3:25:14 PM This report has been signed electronically.

## 2023-08-27 NOTE — Patient Instructions (Signed)
 YOU HAD AN ENDOSCOPIC PROCEDURE TODAY AT THE [facility]:   Refer to the procedure report that was given to you for any specific questions about what was found during the examination.  If the procedure report does not answer your questions, please call your gastroenterologist to clarify.  If you requested that your care partner not be given the details of your procedure findings, then the procedure report has been included in a sealed envelope for you to review at your convenience later.  YOU SHOULD EXPECT: Some feelings of bloating in the abdomen. Passage of more gas than usual.  Walking can help get rid of the air that was put into your GI tract during the procedure and reduce the bloating. If you had a lower endoscopy (such as a colonoscopy or flexible sigmoidoscopy) you may notice spotting of blood in your stool or on the toilet paper. If you underwent a bowel prep for your procedure, you may not have a normal bowel movement for a few days.  Please Note:  You might notice some irritation and congestion in your nose or some drainage.  This is from the oxygen used during your procedure.  There is no need for concern and it should clear up in a day or so.  SYMPTOMS TO REPORT IMMEDIATELY:  Following lower endoscopy (colonoscopy or flexible sigmoidoscopy):  Excessive amounts of blood in the stool  Significant tenderness or worsening of abdominal pains  Swelling of the abdomen that is new, acute  Fever of 100F or higher  Following upper endoscopy (EGD)  Vomiting of blood or coffee ground material  New chest pain or pain under the shoulder blades  Painful or persistently difficult swallowing  New shortness of breath  Fever of 100F or higher  Black, tarry-looking stools  For urgent or emergent issues, a gastroenterologist can be reached at any hour by calling [phone]. Do not use MyChart messaging for urgent concerns.    DIET:  We do recommend a small meal at first, but  then you may proceed to your regular diet.  Drink plenty of fluids but you should avoid alcoholic beverages for 24 hours.  MEDICATIONS: Continue present medications.  FOLLOW UP: Repeat colonoscopy at age 33 for surveillance.  Thank you for allowing us  to provide for your healthcare needs today.  ACTIVITY:  You should plan to take it easy for the rest of today and you should NOT DRIVE or use heavy machinery until tomorrow (because of the sedation medicines used during the test).    FOLLOW UP: Our staff will call the number listed on your records the next business day following your procedure.  We will call around 7:15- 8:00 am to check on you and address any questions or concerns that you may have regarding the information given to you following your procedure. If we do not reach you, we will leave a message.     If any biopsies were taken you will be contacted by phone or by letter within the next 1-3 weeks.  Please call us  at [phone] if you have not heard about the biopsies in 3 weeks.    SIGNATURES/CONFIDENTIALITY: You and/or your care partner have signed paperwork which will be entered into your electronic medical record.  These signatures attest to the fact that that the information above on your After Visit Summary has been reviewed and is understood.  Full responsibility of the confidentiality of this discharge information lies with you and/or your care-partner.

## 2023-08-27 NOTE — Progress Notes (Unsigned)
 Report given to PACU, vss

## 2023-08-27 NOTE — Progress Notes (Signed)
 Interpreter used today at the [facility] for this pt.  Interpreter's name Barkley Boot.

## 2023-08-27 NOTE — Op Note (Signed)
 [facility] Patient Name: Kelsey Davies Procedure Date: 08/27/2023 2:54 PM MRN: [id number] Endoscopist: Sergio Dandy , MD, [id number] Age: 33 Referring MD:  Date of Birth: 11/09/1990 Gender: Female Account #: [id number] Procedure:                Upper GI endoscopy Indications:              Generalized abdominal pain Medicines:                Monitored Anesthesia Care Procedure:                Pre-Anesthesia Assessment:                           - Prior to the procedure, a History and Physical                            was performed, and patient medications and                            allergies were reviewed. The patient's tolerance of                            previous anesthesia was also reviewed. The risks                            and benefits of the procedure and the sedation                            options and risks were discussed with the patient.                            All questions were answered, and informed consent                            was obtained. Prior Anticoagulants: The patient has                            taken no anticoagulant or antiplatelet agents. ASA                            Grade Assessment: I - A normal, healthy patient.                            After reviewing the risks and benefits, the patient                            was deemed in satisfactory condition to undergo the                            procedure.                           After obtaining informed consent, the endoscope was  passed under direct vision. Throughout the                            procedure, the patient's blood pressure, pulse, and                            oxygen saturations were monitored continuously. The                            GIF [id number] #[id number] was introduced through the                            mouth, and advanced to the second part of duodenum.                            The upper GI endoscopy was  accomplished without                            difficulty. The patient tolerated the procedure                            well. Scope In: Scope Out: Findings:                 The esophagus was normal.                           The gastroesophageal flap valve was visualized                            endoscopically and classified as Hill Grade II                            (fold present, opens with respiration).                           The stomach was normal.                           The cardia and gastric fundus were normal on                            retroflexion.                           The examined duodenum was normal. Complications:            No immediate complications. Estimated Blood Loss:     Estimated blood loss was minimal. Impression:               - Normal esophagus.                           - Gastroesophageal flap valve classified as Hill                            Grade II (fold present, opens with respiration).                           -  Normal stomach.                           - Normal examined duodenum.                           - No specimens collected. Recommendation:           - Patient has a contact number available for                            emergencies. The signs and symptoms of potential                            delayed complications were discussed with the                            patient. Return to normal activities tomorrow.                            Written discharge instructions were provided to the                            patient.                           - Resume previous diet.                           - Continue present medications. Janitza Revuelta V. Martavius Lusty, MD 08/27/2023 3:32:28 PM This report has been signed electronically.

## 2023-08-28 ENCOUNTER — Telehealth: Payer: Self-pay

## 2023-08-28 NOTE — Telephone Encounter (Signed)
  Follow up Call-     08/27/2023    1:50 PM  Call back number  Post procedure Call Back phone  # [phone]  Permission to leave phone message Yes     Patient questions:  Do you have a fever, pain , or abdominal swelling? No. Pain Score  0 *  Have you tolerated food without any problems? Yes.    Have you been able to return to your normal activities? Yes.    Do you have any questions about your discharge instructions: Diet   No. Medications  No. Follow up visit  No.  Do you have questions or concerns about your Care? No.  Actions: * If pain score is 4 or above: No action needed, pain <4.

## 2023-09-05 ENCOUNTER — Encounter: Payer: Self-pay | Admitting: Nurse Practitioner

## 2023-09-05 ENCOUNTER — Ambulatory Visit: Attending: Nurse Practitioner | Admitting: Nurse Practitioner

## 2023-09-05 VITALS — BP 104/71 | HR 99 | Resp 18 | Ht 59.0 in | Wt 158.4 lb

## 2023-09-05 DIAGNOSIS — R21 Rash and other nonspecific skin eruption: Secondary | ICD-10-CM

## 2023-09-05 MED ORDER — CLOTRIMAZOLE-BETAMETHASONE 1-0.05 % EX CREA: 1.0000 | TOPICAL_CREAM | Freq: Every day | CUTANEOUS | 1 refills | Status: AC

## 2023-09-05 NOTE — Progress Notes (Signed)
 Assessment & Plan:  Kelsey Davies was seen today for rash.  Diagnoses and all orders for this visit:  Rash -     clotrimazole-betamethasone (LOTRISONE) cream; Apply 1 Application topically daily.    Patient has been counseled on age-appropriate routine health concerns for screening and prevention. These are reviewed and up-to-date. Referrals have been placed accordingly. Immunizations are up-to-date or declined.    Subjective:   Chief Complaint  Patient presents with   Rash    Rash on right inner wrist.    Kelsey Davies 33 y.o. female presents to office today for fungal rash on right wrist.   VRI was used to communicate directly with patient for the entire encounter including providing detailed patient instructions.    She has a large macular dry patchy erythematous rash on the inner right wrist. States the rash has been present for a few months and seems to have increased in circumference and now spread between the web of 2nd and 3rd fingers.  Associated symptoms include itching.  She is also requesting an ultrasound of her abdomen for abdominal pain despite having a recent endoscopy and colonoscopy which were both normal. She was instructed that her last CT of the abdomen and pelvis was less than 1 year ago and at this time she will need to follow back up with GI for abdominal pain.   Review of Systems  Constitutional:  Negative for fever, malaise/fatigue and weight loss.  HENT: Negative.  Negative for nosebleeds.   Eyes: Negative.  Negative for blurred vision, double vision and photophobia.  Respiratory: Negative.  Negative for cough and shortness of breath.   Cardiovascular: Negative.  Negative for chest pain, palpitations and leg swelling.  Gastrointestinal:  Positive for abdominal pain. Negative for blood in stool, constipation, diarrhea, heartburn, melena, nausea and vomiting.  Musculoskeletal: Negative.  Negative for myalgias.  Skin:  Positive for itching and rash.   Neurological: Negative.  Negative for dizziness, focal weakness, seizures and headaches.  Psychiatric/Behavioral: Negative.  Negative for suicidal ideas.     Past Medical History:  Diagnosis Date   Medical history non-contributory     Past Surgical History:  Procedure Laterality Date   CHOLECYSTECTOMY     NO PAST SURGERIES      Family History  Problem Relation Age of Onset   Healthy Mother    Rheum arthritis Sister    Rheum arthritis Brother    Rheum arthritis Maternal Grandmother    Rheum arthritis Maternal Grandfather    Asthma Paternal Grandmother    Hearing loss Neg Hx    Colon cancer Neg Hx    Stomach cancer Neg Hx    Pancreatic cancer Neg Hx    Colon polyps Neg Hx    Esophageal cancer Neg Hx     Social History Reviewed with no changes to be made today.   Outpatient Medications Prior to Visit  Medication Sig Dispense Refill   acetaminophen  (TYLENOL ) 500 MG tablet Take 1 tablet (500 mg total) by mouth every 6 (six) hours as needed. 30 tablet 0   guaiFENesin  (ROBITUSSIN) 100 MG/5ML liquid Take 5-10 mLs (100-200 mg total) by mouth every 4 (four) hours as needed for cough or to loosen phlegm. 60 mL 0   ibuprofen  (ADVIL ) 600 MG tablet Take 1 tablet (600 mg total) by mouth every 8 (eight) hours as needed. 60 tablet 1   Misc. Devices MISC Please supply with size M adult diapers, gloves and wipes. ICD 10 N39.3 1 each 0  senna-docusate (SENOKOT-S) 8.6-50 MG tablet Take 2 tablets by mouth daily. 60 tablet 6   Vitamin D , Ergocalciferol , (DRISDOL ) 1.25 MG (50000 UNIT) CAPS capsule Take 1 capsule (50,000 Units total) by mouth every 7 (seven) days. Please fill as a 90 day supply 12 capsule 0   Facility-Administered Medications Prior to Visit  Medication Dose Route Frequency Provider Last Rate Last Admin   0.9 %  sodium chloride  infusion  500 mL Intravenous Once Nandigam, Kavitha V, MD        No Known Allergies     Objective:    BP 104/71 (BP Location: Left Arm, Patient  Position: Sitting, Cuff Size: Normal)   Pulse 99   Resp 18   Ht 4\' 11"  (1.499 m)   Wt 158 lb 6.4 oz (71.8 kg)   LMP 08/02/2023 (Exact Date)   SpO2 98%   BMI 31.99 kg/m  Wt Readings from Last 3 Encounters:  09/05/23 158 lb 6.4 oz (71.8 kg)  08/27/23 157 lb (71.2 kg)  07/24/23 157 lb 12.8 oz (71.6 kg)    Physical Exam Vitals and nursing note reviewed.  Constitutional:      Appearance: She is well-developed.  HENT:     Head: Normocephalic and atraumatic.  Cardiovascular:     Rate and Rhythm: Normal rate and regular rhythm.     Heart sounds: Normal heart sounds. No murmur heard.    No friction rub. No gallop.  Pulmonary:     Effort: Pulmonary effort is normal. No tachypnea or respiratory distress.     Breath sounds: Normal breath sounds. No decreased breath sounds, wheezing, rhonchi or rales.  Chest:     Chest wall: No tenderness.  Abdominal:     General: Bowel sounds are normal.     Palpations: Abdomen is soft.  Musculoskeletal:        General: Normal range of motion.     Cervical back: Normal range of motion.  Skin:    General: Skin is warm and dry.  Neurological:     Mental Status: She is alert and oriented to person, place, and time.     Coordination: Coordination normal.  Psychiatric:        Behavior: Behavior normal. Behavior is cooperative.        Thought Content: Thought content normal.        Judgment: Judgment normal.          Patient has been counseled extensively about nutrition and exercise as well as the importance of adherence with medications and regular follow-up. The patient was given clear instructions to go to ER or return to medical center if symptoms don't improve, worsen or new problems develop. The patient verbalized understanding.   Follow-up: No follow-ups on file.   Collins Dean, FNP-BC [facility] [place], [place] [phone]   09/05/2023, 2:16 PM

## 2023-09-07 ENCOUNTER — Other Ambulatory Visit: Payer: Self-pay

## 2023-09-07 ENCOUNTER — Ambulatory Visit (HOSPITAL_COMMUNITY)
Admission: EM | Admit: 2023-09-07 | Discharge: 2023-09-07 | Disposition: A | Discharge status: Home / Self Care | Attending: Family Medicine | Admitting: Family Medicine

## 2023-09-07 ENCOUNTER — Ambulatory Visit (INDEPENDENT_AMBULATORY_CARE_PROVIDER_SITE_OTHER)

## 2023-09-07 ENCOUNTER — Encounter (HOSPITAL_COMMUNITY): Payer: Self-pay | Admitting: Emergency Medicine

## 2023-09-07 DIAGNOSIS — M25571 Pain in right ankle and joints of right foot: Secondary | ICD-10-CM | POA: Diagnosis not present

## 2023-09-07 LAB — POCT URINE PREGNANCY: Preg Test, Ur: NEGATIVE

## 2023-09-07 MED ORDER — IBUPROFEN 600 MG PO TABS: 600.0000 mg | ORAL_TABLET | Freq: Three times a day (TID) | ORAL | 0 refills | Status: DC | PRN

## 2023-09-07 NOTE — ED Provider Notes (Signed)
 [facility]    CSN: [id number] Arrival date & time: 09/07/23  1045      History   Chief Complaint Chief Complaint  Patient presents with   Ankle Pain    HPI Kelsey Davies is a 33 y.o. female.    Ankle Pain Here for right lateral ankle pain.  Yesterday evening she twisted her ankle and tripped and fell.  Since then it hurts over the right lateral ankle.  NKDA  Last menstrual cycle was April 12.  She did do a urine pregnancy test last week that was negative.    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 Authorizing Provider  ibuprofen  (ADVIL ) 600 MG tablet Take 1 tablet (600 mg total) by mouth every 8 (eight) hours as needed (pain). 09/07/23  Yes Ann Keto, MD  acetaminophen  (TYLENOL ) 500 MG tablet Take 1 tablet (500 mg total) by mouth every 6 (six) hours as needed. 08/12/23   Imogene Mana, NP  clotrimazole -betamethasone  (LOTRISONE ) cream Apply 1 Application topically daily. 09/05/23   Fleming, Zelda W, NP  guaiFENesin  (ROBITUSSIN) 100 MG/5ML liquid Take 5-10 mLs (100-200 mg total) by mouth every 4 (four) hours as needed for cough or to loosen phlegm. 08/12/23   Imogene Mana, NP  Misc.  Devices MISC Please supply with size M adult diapers, gloves and wipes. ICD 10 N39.3 09/18/21   Fleming, Zelda W, NP  senna-docusate (SENOKOT-S) 8.6-50 MG tablet Take 2 tablets by mouth daily. 06/03/22   Fleming, Zelda W, NP  Vitamin D , Ergocalciferol , (DRISDOL ) 1.25 MG (50000 UNIT) CAPS capsule Take 1 capsule (50,000 Units total) by mouth every 7 (seven) days. Please fill as a 90 day supply 08/07/22   Collins Dean, NP    Family History Family History  Problem Relation Age of Onset   Healthy Mother    Rheum arthritis Sister    Rheum arthritis Brother    Rheum arthritis Maternal Grandmother    Rheum arthritis Maternal Grandfather    Asthma Paternal Grandmother    Hearing loss Neg Hx    Colon cancer Neg Hx    Stomach cancer Neg Hx    Pancreatic cancer Neg Hx    Colon polyps Neg Hx    Esophageal cancer Neg Hx     Social History Social History   Tobacco Use   Smoking status: Never   Smokeless tobacco: Never  Vaping Use  Vaping status: Never Used  Substance Use Topics   Alcohol use: No   Drug use: No     Allergies   Patient has no known allergies.   Review of Systems Review of Systems   Physical Exam Triage Vital Signs ED Triage Vitals [09/07/23 1100]  Encounter Vitals Group     BP 103/72     Systolic BP Percentile      Diastolic BP Percentile      Pulse Rate 91     Resp 18     Temp 98.3 F (36.8 C)     Temp Source Oral     SpO2 98 %     Weight      Height      Head Circumference      Peak Flow      Pain Score 10     Pain Loc      Pain Education      Exclude from Growth Chart    No data found.  Updated Vital Signs BP 103/72 (BP Location: Right Arm)   Pulse 91   Temp 98.3 F (36.8 C) (Oral)   Resp 18   LMP 08/02/2023 (Exact Date)   SpO2 98%   Visual Acuity Right Eye Distance:   Left Eye Distance:   Bilateral Distance:    Right Eye Near:   Left Eye Near:    Bilateral Near:     Physical Exam Vitals reviewed.  Constitutional:       General: She is not in acute distress.    Appearance: She is not toxic-appearing.  Musculoskeletal:     Comments: There is swelling and tenderness over her right lateral malleolus.  Pulses in the right foot are normal.  Are normal.  Skin:    Coloration: Skin is not jaundiced or pale.  Neurological:     Mental Status: She is alert and oriented to person, place, and time.  Psychiatric:        Behavior: Behavior normal.      UC Treatments / Results  Labs (all labs ordered are listed, but only abnormal results are displayed) Labs Reviewed  POCT URINE PREGNANCY    EKG   Radiology DG Ankle Complete Right Result Date: 09/07/2023 CLINICAL DATA:  33 year old female with right ankle pain and swelling after a fall yesterday. EXAM: RIGHT ANKLE - COMPLETE 3+ VIEW COMPARISON:  None Available. FINDINGS: Three views 1141 hours. Bone mineralization is within normal limits. Maintained mortise joint alignment. Talar dome intact. Evidence of small joint effusion on the lateral, as well as lateral and anterior soft tissue swelling. Distal tibia and fibula, calcaneus appear intact. No osseous abnormality identified. IMPRESSION: Soft tissue swelling and evidence of a small joint effusion. No fracture or dislocation identified about the right ankle. Electronically Signed   By: Marlise Simpers M.D.   On: 09/07/2023 11:47    Procedures Procedures (including critical care time)  Medications Ordered in UC Medications - No data to display  Initial Impression / Assessment and Plan / UC Course  I have reviewed the triage vital signs and the nursing notes.  Pertinent labs & imaging results that were available during my care of the patient were reviewed by me and considered in my medical decision making (see chart for details).     X-ray is read as negative.  UPT is negative.  Ibuprofen  600 mg is sent and an Ace wrap is applied.  She is also supplied with some crutches.  Follow-up with primary care  as recommended  and she is given contact information for orthopedics Final Clinical Impressions(s) / UC Diagnoses   Final diagnoses:  Acute right ankle pain     Discharge Instructions      Your x-ray did not show any broken bones.  Still, there is most likely some soft tissue injury  Take ibuprofen  600 mg--1 tab every 8 hours as needed for pain.  Ice and elevation can help.  Please follow-up with your primary care      ED Prescriptions     Medication Sig Dispense Auth. Provider   ibuprofen  (ADVIL ) 600 MG tablet Take 1 tablet (600 mg total) by mouth every 8 (eight) hours as needed (pain). 15 tablet Vallie Fayette K, MD      PDMP not reviewed this encounter.   Ann Keto, MD 09/07/23 1155

## 2023-09-07 NOTE — Discharge Instructions (Signed)
 Your x-ray did not show any broken bones.  Still, there is most likely some soft tissue injury  Take ibuprofen  600 mg--1 tab every 8 hours as needed for pain.  Ice and elevation can help.  Please follow-up with your primary care

## 2023-09-07 NOTE — ED Triage Notes (Signed)
 Pt c/o right ankle pain and swollen since yesterday afternoon after a fall.

## 2023-10-29 ENCOUNTER — Encounter: Payer: Self-pay | Admitting: Nurse Practitioner

## 2023-10-29 ENCOUNTER — Ambulatory Visit: Payer: Self-pay | Attending: Nurse Practitioner | Admitting: Nurse Practitioner

## 2023-10-29 VITALS — BP 110/76 | HR 86 | Resp 19 | Ht 59.0 in | Wt 158.0 lb

## 2023-10-29 DIAGNOSIS — D72829 Elevated white blood cell count, unspecified: Secondary | ICD-10-CM

## 2023-10-29 DIAGNOSIS — R21 Rash and other nonspecific skin eruption: Secondary | ICD-10-CM

## 2023-10-29 DIAGNOSIS — R1084 Generalized abdominal pain: Secondary | ICD-10-CM

## 2023-10-29 DIAGNOSIS — R079 Chest pain, unspecified: Secondary | ICD-10-CM

## 2023-10-29 DIAGNOSIS — R110 Nausea: Secondary | ICD-10-CM

## 2023-10-29 MED ORDER — MOMETASONE FUROATE 0.1 % EX CREA: TOPICAL_CREAM | Freq: Every day | CUTANEOUS | 1 refills | Status: DC

## 2023-10-29 NOTE — Progress Notes (Signed)
 Assessment and Plan  Abdominal Pain Persistent abdominal pain with negative gastroenterological workup. Considering H. pylori due to husband's positive result and it patient's request. - Order Helicobacter pylori test. - Advise follow-up with gastroenterologist if pain persists.  Chest Pain Chest pain with nausea, no clear etiology found.  Dermatitis Rash on fingers, unresponsive to previous treatments. Dermatology consultation pending. - Prescribe mometasone  steroid cream - Advise follow-up with dermatologist.   Generalized abdominal pain -     H. pylori breath test -     CMP14+EGFR  Nausea -     H. pylori breath test -     CMP14+EGFR  Leukocytosis, unspecified type -     CBC with Differential    Patient has been counseled on age-appropriate routine health concerns for screening and prevention. These are reviewed and up-to-date. Referrals have been placed accordingly. Immunizations are up-to-date or declined.    Subjective:   Chief Complaint  Patient presents with   Abdominal Pain    Right left sided biting pain.    Nausea   History of Present Illness   Kelsey Davies presents with abdominal pain and chest pain.    She has been experiencing chronic epigastric and generalized abdominal pain and chest pain. Previous extensive evaluation by gastroenterology, including an endoscopy, did not reveal any abnormalities. She is requesting H. pylori testing today as her husband has a history of H. pylori infection, and she is concerned about having the same issue, although her last test for H. pylori was negative in 2024.  She is experiencing rashes on her fingers of the right hand, which are different from previous occurrences. The rashes are itchy and sometimes bleed when scratched. She has been using a steroid and antifungal cream daily, but it has not provided relief.   Review of Systems  Constitutional:  Negative for fever, malaise/fatigue and weight loss.  HENT:  Negative.  Negative for nosebleeds.   Eyes: Negative.  Negative for blurred vision, double vision and photophobia.  Respiratory: Negative.  Negative for cough and shortness of breath.   Cardiovascular: Negative.  Negative for chest pain, palpitations and leg swelling.  Gastrointestinal:  Positive for abdominal pain. Negative for blood in stool, diarrhea, heartburn, melena, nausea and vomiting.  Musculoskeletal: Negative.  Negative for myalgias.  Skin:  Positive for itching and rash.  Neurological: Negative.  Negative for dizziness, focal weakness, seizures and headaches.  Psychiatric/Behavioral: Negative.  Negative for suicidal ideas.     Past Medical History:  Diagnosis Date   Medical history non-contributory     Past Surgical History:  Procedure Laterality Date   CHOLECYSTECTOMY     NO PAST SURGERIES      Family History  Problem Relation Age of Onset   Healthy Mother    Rheum arthritis Sister    Rheum arthritis Brother    Rheum arthritis Maternal Grandmother    Rheum arthritis Maternal Grandfather    Asthma Paternal Grandmother    Hearing loss Neg Hx    Colon cancer Neg Hx    Stomach cancer Neg Hx    Pancreatic cancer Neg Hx    Colon polyps Neg Hx    Esophageal cancer Neg Hx     Social History Reviewed with no changes to be made today.   Outpatient Medications Prior to Visit  Medication Sig Dispense Refill   acetaminophen  (TYLENOL ) 500 MG tablet Take 1 tablet (500 mg total) by mouth every 6 (six) hours as needed. 30 tablet 0   clotrimazole -betamethasone  (  LOTRISONE ) cream Apply 1 Application topically daily. 30 g 1   guaiFENesin  (ROBITUSSIN) 100 MG/5ML liquid Take 5-10 mLs (100-200 mg total) by mouth every 4 (four) hours as needed for cough or to loosen phlegm. 60 mL 0   ibuprofen  (ADVIL ) 600 MG tablet Take 1 tablet (600 mg total) by mouth every 8 (eight) hours as needed (pain). 15 tablet 0   Misc. Devices MISC Please supply with size M adult diapers, gloves and wipes.  ICD 10 N39.3 1 each 0   senna-docusate (SENOKOT-S) 8.6-50 MG tablet Take 2 tablets by mouth daily. 60 tablet 6   Vitamin D , Ergocalciferol , (DRISDOL ) 1.25 MG (50000 UNIT) CAPS capsule Take 1 capsule (50,000 Units total) by mouth every 7 (seven) days. Please fill as a 90 day supply 12 capsule 0   Facility-Administered Medications Prior to Visit  Medication Dose Route Frequency Provider Last Rate Last Admin   0.9 %  sodium chloride  infusion  500 mL Intravenous Once Nandigam, Kavitha V, MD        No Known Allergies     Objective:    BP 110/76 (BP Location: Left Arm, Patient Position: Sitting, Cuff Size: Normal)   Pulse 86   Resp 19   Ht 4' 11 (1.499 m)   Wt 158 lb (71.7 kg)   LMP 10/03/2023   SpO2 100%   BMI 31.91 kg/m  Wt Readings from Last 3 Encounters:  10/29/23 158 lb (71.7 kg)  09/05/23 158 lb 6.4 oz (71.8 kg)  08/27/23 157 lb (71.2 kg)    Physical Exam Vitals and nursing note reviewed.  Constitutional:      Appearance: She is well-developed.  HENT:     Head: Normocephalic and atraumatic.  Cardiovascular:     Rate and Rhythm: Normal rate and regular rhythm.     Heart sounds: Normal heart sounds. No murmur heard.    No friction rub. No gallop.  Pulmonary:     Effort: Pulmonary effort is normal. No tachypnea or respiratory distress.     Breath sounds: Normal breath sounds. No decreased breath sounds, wheezing, rhonchi or rales.  Chest:     Chest wall: No tenderness.  Abdominal:     General: Bowel sounds are normal.     Palpations: Abdomen is soft.  Musculoskeletal:        General: Normal range of motion.     Cervical back: Normal range of motion.  Skin:    General: Skin is warm and dry.  Neurological:     Mental Status: She is alert and oriented to person, place, and time.     Coordination: Coordination normal.  Psychiatric:        Behavior: Behavior normal. Behavior is cooperative.        Thought Content: Thought content normal.        Judgment: Judgment  normal.          Patient has been counseled extensively about nutrition and exercise as well as the importance of adherence with medications and regular follow-up. The patient was given clear instructions to go to ER or return to medical center if symptoms don't improve, worsen or new problems develop. The patient verbalized understanding.   Follow-up: Return if symptoms worsen or fail to improve.   Kelsey LELON Servant, FNP-BC [facility] [place], [place] [phone]   10/29/2023, 12:08 PM

## 2023-10-31 LAB — CBC WITH DIFFERENTIAL/PLATELET
Basophils Absolute: 0.1 x10E3/uL (ref 0.0–0.2)
Basos: 1 %
EOS (ABSOLUTE): 0.3 x10E3/uL (ref 0.0–0.4)
Eos: 4 %
Hematocrit: 43 % (ref 34.0–46.6)
Hemoglobin: 13.5 g/dL (ref 11.1–15.9)
Immature Grans (Abs): 0.1 x10E3/uL (ref 0.0–0.1)
Immature Granulocytes: 1 %
Lymphocytes Absolute: 2.6 x10E3/uL (ref 0.7–3.1)
Lymphs: 28 %
MCH: 26.5 pg — ABNORMAL LOW (ref 26.6–33.0)
MCHC: 31.4 g/dL — ABNORMAL LOW (ref 31.5–35.7)
MCV: 84 fL (ref 79–97)
Monocytes Absolute: 0.5 x10E3/uL (ref 0.1–0.9)
Monocytes: 5 %
Neutrophils Absolute: 5.9 x10E3/uL (ref 1.4–7.0)
Neutrophils: 61 %
Platelets: 473 x10E3/uL — ABNORMAL HIGH (ref 150–450)
RBC: 5.1 x10E6/uL (ref 3.77–5.28)
RDW: 14.2 % (ref 11.7–15.4)
WBC: 9.5 x10E3/uL (ref 3.4–10.8)

## 2023-10-31 LAB — CMP14+EGFR
ALT: 27 IU/L (ref 0–32)
AST: 22 IU/L (ref 0–40)
Albumin: 4.4 g/dL (ref 4.0–5.0)
Alkaline Phosphatase: 101 IU/L (ref 44–121)
BUN/Creatinine Ratio: 16 (ref 9–23)
BUN: 9 mg/dL (ref 6–20)
Bilirubin Total: 0.3 mg/dL (ref 0.0–1.2)
CO2: 19 mmol/L — ABNORMAL LOW (ref 20–29)
Calcium: 9.7 mg/dL (ref 8.7–10.2)
Chloride: 103 mmol/L (ref 96–106)
Creatinine, Ser: 0.56 mg/dL — ABNORMAL LOW (ref 0.57–1.00)
Globulin, Total: 2.6 g/dL (ref 1.5–4.5)
Glucose: 83 mg/dL (ref 70–99)
Potassium: 5 mmol/L (ref 3.5–5.2)
Sodium: 140 mmol/L (ref 134–144)
Total Protein: 7 g/dL (ref 6.0–8.5)
eGFR: 126 mL/min/1.73 (ref >59)

## 2023-10-31 LAB — H. PYLORI BREATH TEST: H pylori Breath Test: NEGATIVE

## 2023-10-31 LAB — H. PYLORI BREATH COLLECTION

## 2023-11-03 ENCOUNTER — Ambulatory Visit: Payer: Self-pay | Admitting: Nurse Practitioner

## 2023-11-28 ENCOUNTER — Telehealth: Payer: Self-pay | Admitting: Nurse Practitioner

## 2023-11-28 IMAGING — DX DG KNEE COMPLETE 4+V*L*
4 series · 4 of 4 positions shown · non-contrast
Comparison: None Available.

CLINICAL DATA: Chronic left knee pain, no known injury, initial
encounter

EXAM:
LEFT KNEE - COMPLETE 4+ VIEW

[dg knee complete 4 views left (1 of 4)]
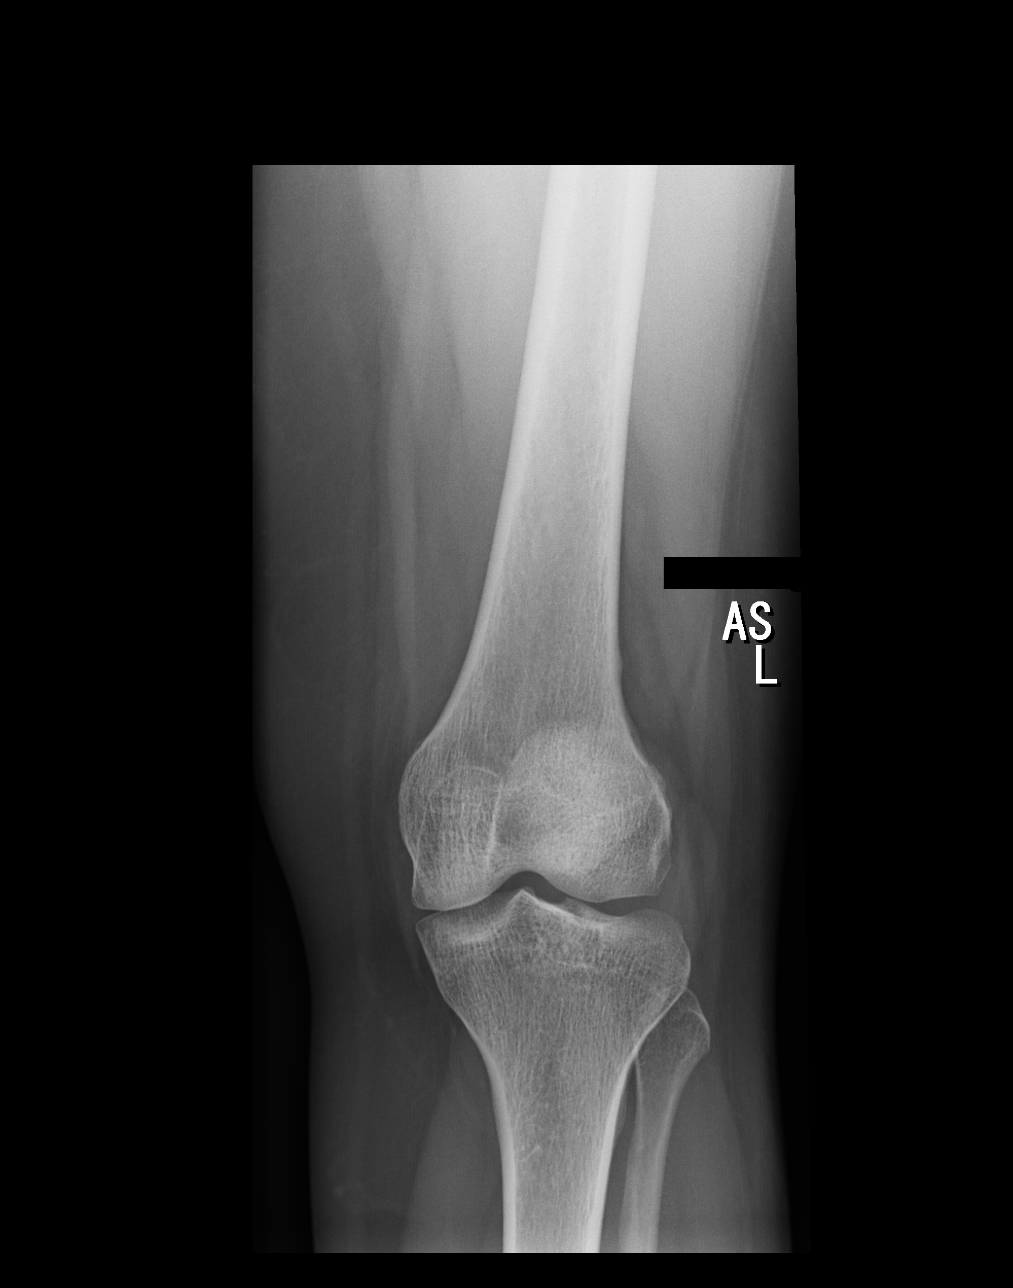

[dg knee complete 4 views left (2 of 4)]
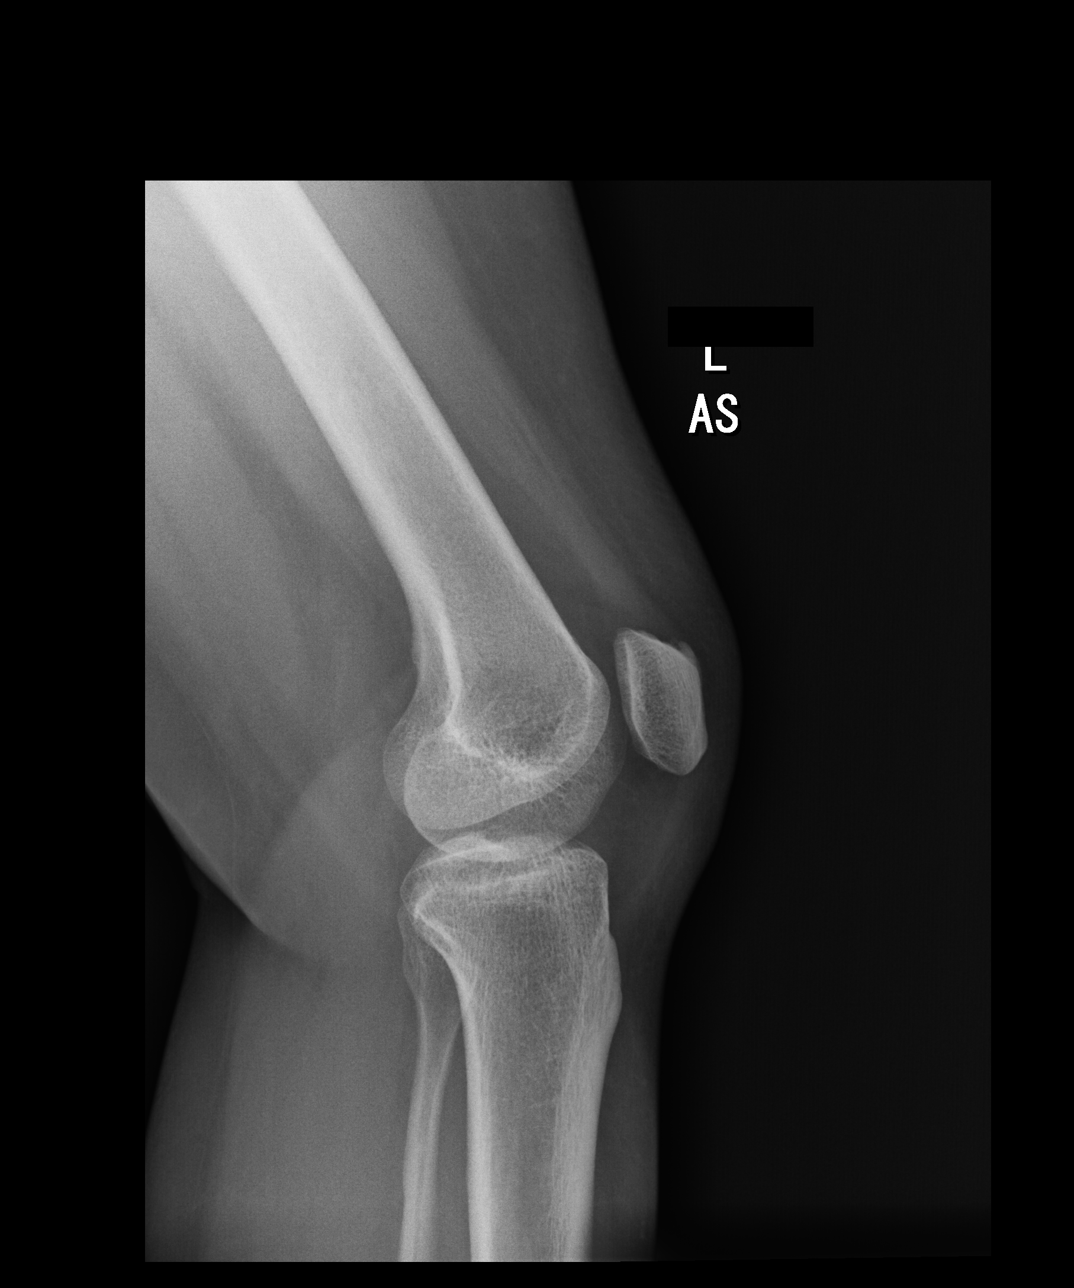

[dg knee complete 4 views left (3 of 4)]
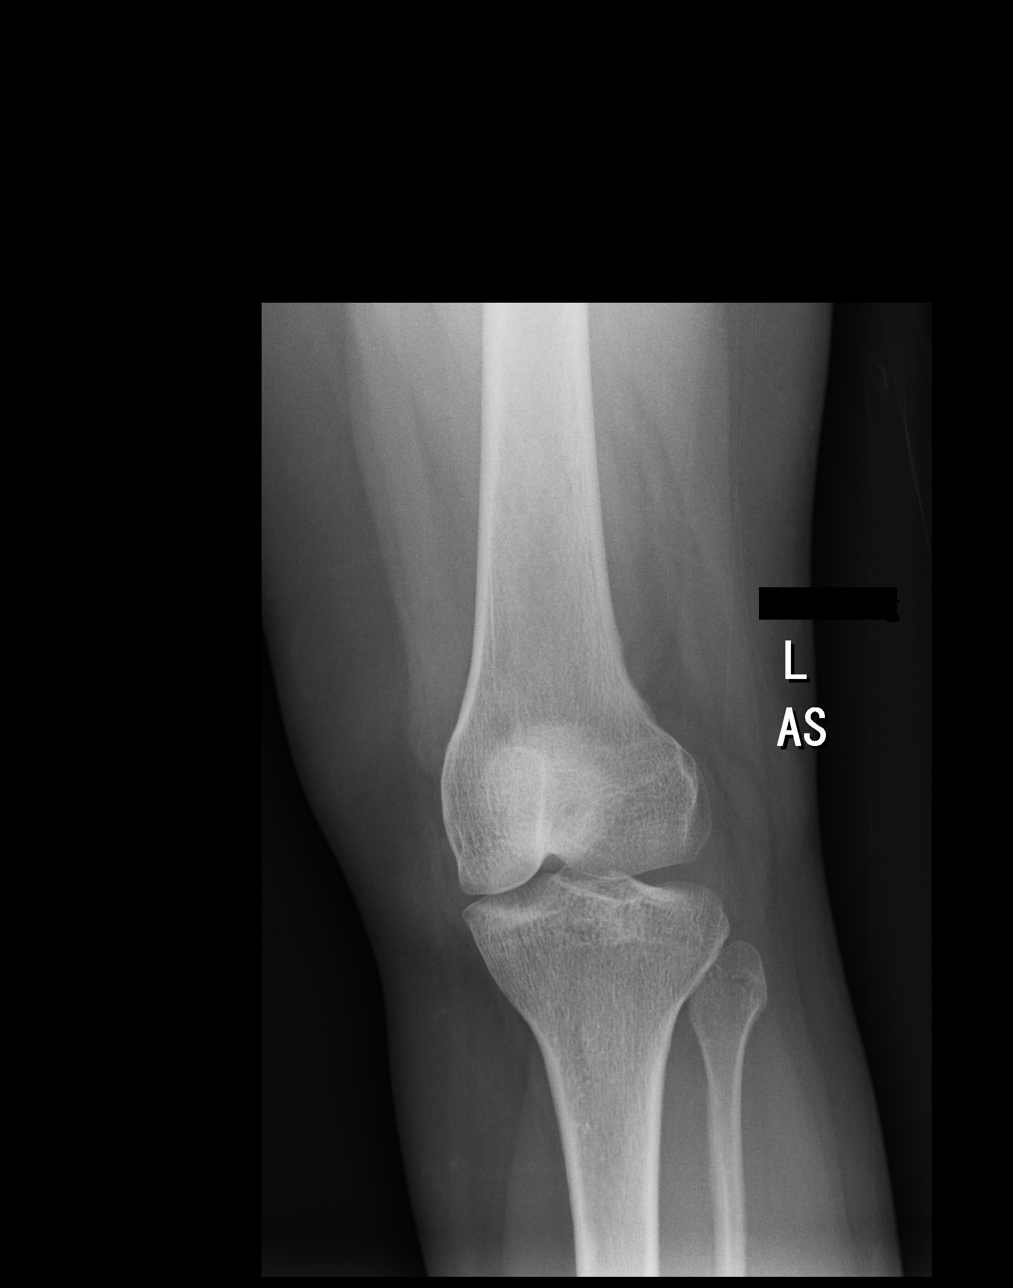

[dg knee complete 4 views left (4 of 4)]
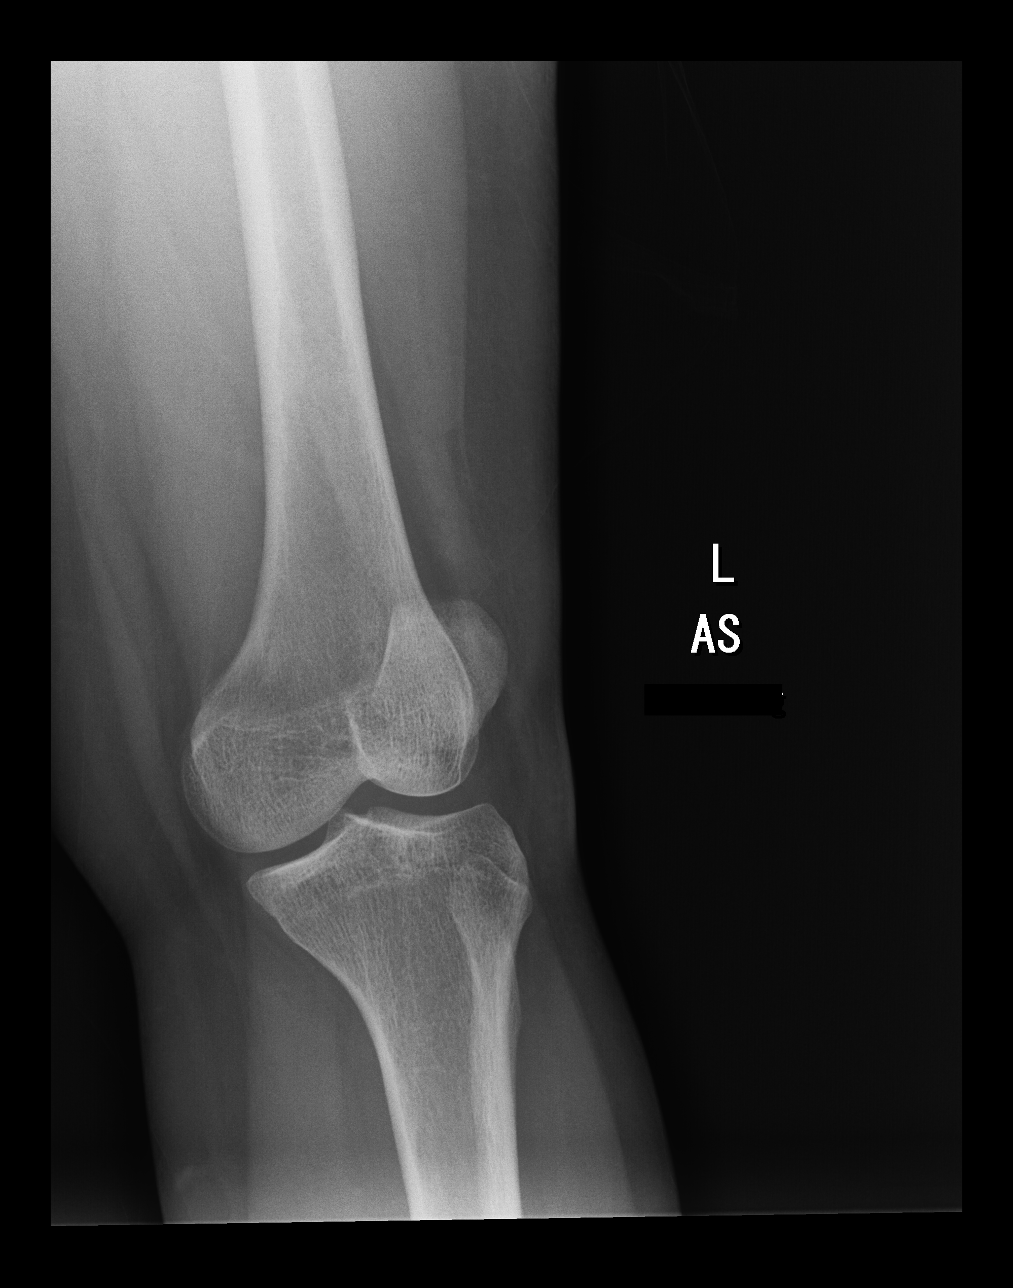

[4 of 4 positions shown; findings below may reference images not displayed]

FINDINGS: No evidence of fracture, dislocation, or joint effusion. No evidence
of arthropathy or other focal bone abnormality. Soft tissues are
unremarkable.
IMPRESSION: No acute abnormality noted.

## 2023-11-28 NOTE — Telephone Encounter (Signed)
 Pt confirmed appt 8/8

## 2023-12-01 ENCOUNTER — Ambulatory Visit: Payer: Self-pay | Attending: Family Medicine | Admitting: Nurse Practitioner

## 2023-12-01 ENCOUNTER — Encounter: Payer: Self-pay | Admitting: Nurse Practitioner

## 2023-12-01 VITALS — BP 109/75 | HR 93 | Resp 19 | Ht 59.0 in | Wt 156.8 lb

## 2023-12-01 DIAGNOSIS — M25571 Pain in right ankle and joints of right foot: Secondary | ICD-10-CM

## 2023-12-01 DIAGNOSIS — G8929 Other chronic pain: Secondary | ICD-10-CM

## 2023-12-01 DIAGNOSIS — Z5986 Financial insecurity: Secondary | ICD-10-CM

## 2023-12-01 DIAGNOSIS — M545 Low back pain, unspecified: Secondary | ICD-10-CM

## 2023-12-01 DIAGNOSIS — Z5971 Insufficient health insurance coverage: Secondary | ICD-10-CM

## 2023-12-01 MED ORDER — ACETAMINOPHEN 500 MG PO TABS: 500.0000 mg | ORAL_TABLET | Freq: Four times a day (QID) | ORAL | 0 refills | Status: AC | PRN

## 2023-12-01 MED ORDER — MELOXICAM 7.5 MG PO TABS: 7.5000 mg | ORAL_TABLET | Freq: Every day | ORAL | 0 refills | Status: AC
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
## 2023-12-16 ENCOUNTER — Ambulatory Visit (INDEPENDENT_AMBULATORY_CARE_PROVIDER_SITE_OTHER): Payer: Self-pay | Admitting: Gastroenterology

## 2023-12-16 ENCOUNTER — Encounter: Payer: Self-pay | Admitting: Gastroenterology

## 2023-12-16 VITALS — HR 96 | Ht 59.0 in | Wt 156.0 lb

## 2023-12-16 DIAGNOSIS — R110 Nausea: Secondary | ICD-10-CM | POA: Insufficient documentation

## 2023-12-16 DIAGNOSIS — R1084 Generalized abdominal pain: Secondary | ICD-10-CM

## 2023-12-16 MED ORDER — HYOSCYAMINE SULFATE 0.125 MG SL SUBL
0.1250 mg | SUBLINGUAL_TABLET | Freq: Three times a day (TID) | SUBLINGUAL | 1 refills | Status: DC
Start: 2023-12-16 — End: 2024-01-08

## 2023-12-16 NOTE — Progress Notes (Unsigned)
 12/16/2023 Kelsey Davies [id number] December 22, 1990   HISTORY OF PRESENT ILLNESS: This is a 33 year old female who is a patient of Dr. Trenna.  Patient primarily speaks Nepali so an interpreter/translator was present during the entirety of the visit.  She has been evaluated for ongoing complaints of abdominal pain.  From previous notes it seems that this pain has been present for the past 2 years, but today she tells me it has been present for couple of months.  She underwent EGD and colonoscopy back in May for when she was seen in April for complaints of abdominal pain so some of her story is not exactly consistent.  She complains of abdominal pain on both sides of the mid to lower abdomen (same locations as previous).  She tells me that it is a pinching sensation.  She says that it is worse 10 to 15 minutes after eating.  She also says though that it is worse if she tries to move quickly like if she is lying down at night and tries to move quickly from one side to another.  She says that she did not have any improvement with dicyclomine  although it appears that in 2023 she had said she was doing well with dicyclomine  regularly.  She also reports nausea with this and says that she has dizziness with it at times.  EGD 08/2023:  - Normal esophagus. - Gastroesophageal flap valve classified as Hill Grade II ( fold present, opens with respiration) . - Normal stomach. - Normal examined duodenum. - No specimens collected.  Colonoscopy 08/2023:  - Non- bleeding external and internal hemorrhoids. - The examination was otherwise normal. - The examined portion of the ileum was normal. - No specimens collected.  CT scan abdomen and pelvis with contrast 09/2022:  IMPRESSION: 1. Interval development of shotty mesenteric adenopathy, nonspecific, possibly reactive or inflammatory as can be seen with enterocolitis or mesenteric adenitis. 2. Moderate hepatic steatosis.  It looks like after the CT scan she  was treated with ceftriaxone , doxycycline , and metronidazole  for PID.  Past Medical History:  Diagnosis Date   Medical history non-contributory    Past Surgical History:  Procedure Laterality Date   CHOLECYSTECTOMY     NO PAST SURGERIES      reports that she has never smoked. She has never used smokeless tobacco. She reports that she does not drink alcohol and does not use drugs. family history includes Asthma in her paternal grandmother; Healthy in her mother; Rheum arthritis in her brother, maternal grandfather, maternal grandmother, and sister. No Known Allergies    Outpatient Encounter Medications as of 12/16/2023  Medication Sig   acetaminophen  (TYLENOL ) 500 MG tablet Take 1 tablet (500 mg total) by mouth every 6 (six) hours as needed.   clotrimazole -betamethasone  (LOTRISONE ) cream Apply 1 Application topically daily.   meloxicam  (MOBIC ) 7.5 MG tablet Take 1-2 tablets (7.5-15 mg total) by mouth daily.   Misc. Devices MISC Please supply with size M adult diapers, gloves and wipes. ICD 10 N39.3   [DISCONTINUED] guaiFENesin  (ROBITUSSIN) 100 MG/5ML liquid Take 5-10 mLs (100-200 mg total) by mouth every 4 (four) hours as needed for cough or to loosen phlegm.   [DISCONTINUED] mometasone  (ELOCON ) 0.1 % cream Apply topically daily.   [DISCONTINUED] senna-docusate (SENOKOT-S) 8.6-50 MG tablet Take 2 tablets by mouth daily.   [DISCONTINUED] Vitamin D , Ergocalciferol , (DRISDOL ) 1.25 MG (50000 UNIT) CAPS capsule Take 1 capsule (50,000 Units total) by mouth every 7 (seven) days. Please fill as a 90  day supply (Patient not taking: Reported on 12/01/2023)   Facility-Administered Encounter Medications as of 12/16/2023  Medication   0.9 %  sodium chloride  infusion    REVIEW OF SYSTEMS  : All other systems reviewed and negative except where noted in the History of Present Illness.   PHYSICAL EXAM: Pulse 96   Ht 4' 11 (1.499 m)   Wt 156 lb (70.8 kg)   SpO2 99%   BMI 31.51 kg/m  General:  Well developed female in no acute distress Head: Normocephalic and atraumatic Eyes:  Sclerae anicteric, conjunctiva pink. Ears: Normal auditory acuity Lungs: Clear throughout to auscultation; no W/R/R. Heart: Regular rate and rhythm; no M/R/G. Abdomen: Soft, non-distended.  BS present.  Mild diffuse TTP. Musculoskeletal: Symmetrical with no gross deformities  Skin: No lesions on visible extremities Extremities: No edema  Neurological: Alert oriented x 4, grossly non-focal Psychological:  Alert and cooperative. Normal mood and affect  ASSESSMENT AND PLAN: *Generalized abdominal pain and nausea: I am not really sure what to make of her abdominal pain.  She says that it is worse about 10 to 15 minutes after eating, but also seems to be worse when moving quickly from side-to-side while she is lying down, etc.  Describes nausea with it, sometimes dizziness.  CT scan June 2024 showed shotty mesenteric adenopathy possibly reactive or inflammatory but could be related to an enterocolitis or mesenteric adenitis.  She has had normal EGD and colonoscopy.  -I am going to repeat a CT scan of the abdomen and pelvis with contrast to compare to previous. - She reports no improvement with dicyclomine  previously, but is willing to try Levsin .  I am going to prescribe for her to take 2-3 times daily.  I would like her to take it consistently in the morning and then the evenings and then midday if needed.  Prescription sent to pharmacy.   CC:  Theotis Haze ORN, NP

## 2023-12-16 NOTE — Patient Instructions (Signed)
 We have sent the following medications to your pharmacy for you to pick up at your convenience: Levsin  0.125 mg SL three times daily at breakfast, lunch and dinner.   You have been scheduled for a CT scan of the abdomen and pelvis at [facility], 1st floor Radiology. You are scheduled on Wednesday 12/24/23 at 3 pm. You should arrive 15 minutes prior to your appointment time for registration.   Please follow the written instructions below on the day of your exam:   1) Do not eat anything after 1 pm (4 hours prior to your test)   You may take any medications as prescribed with a small amount of water, if necessary. If you take any of the following medications: METFORMIN, GLUCOPHAGE, GLUCOVANCE, AVANDAMET, RIOMET, FORTAMET, ACTOPLUS MET, JANUMET, GLUMETZA or METAGLIP, you MAY be asked to HOLD this medication 48 hours AFTER the exam.   The purpose of you drinking the oral contrast is to aid in the visualization of your intestinal tract. The contrast solution may cause some diarrhea. Depending on your individual set of symptoms, you may also receive an intravenous injection of x-ray contrast/dye. Plan on being at [facility] for 45 minutes or longer, depending on the type of exam you are having performed.   If you have any questions regarding your exam or if you need to reschedule, you may call [facility] at [phone] between the hours of 8:00 am and 5:00 pm, Monday-Friday.

## 2023-12-17 ENCOUNTER — Encounter: Payer: Self-pay | Admitting: Gastroenterology

## 2023-12-24 ENCOUNTER — Telehealth: Payer: Self-pay | Admitting: Nurse Practitioner

## 2023-12-24 ENCOUNTER — Ambulatory Visit (HOSPITAL_COMMUNITY)
Admission: RE | Admit: 2023-12-24 | Discharge: 2023-12-24 | Disposition: A | Discharge status: Home / Self Care | Payer: Self-pay | Source: Ambulatory Visit | Attending: Gastroenterology | Admitting: Gastroenterology

## 2023-12-24 DIAGNOSIS — R110 Nausea: Secondary | ICD-10-CM | POA: Insufficient documentation

## 2023-12-24 DIAGNOSIS — R1084 Generalized abdominal pain: Secondary | ICD-10-CM | POA: Insufficient documentation

## 2023-12-24 MED ORDER — IOHEXOL 300 MG/ML  SOLN
100.0000 mL | Freq: Once | INTRAMUSCULAR | Status: AC | PRN
  Administered 2023-12-24: 100 mL via INTRAVENOUS

## 2023-12-24 NOTE — Telephone Encounter (Signed)
 Pt confirmed appt 9/3 (per vr)

## 2023-12-26 ENCOUNTER — Encounter: Payer: Self-pay | Admitting: Nurse Practitioner

## 2023-12-26 ENCOUNTER — Ambulatory Visit: Payer: Self-pay | Attending: Nurse Practitioner | Admitting: Nurse Practitioner

## 2023-12-26 VITALS — BP 107/75 | HR 100 | Resp 19 | Ht 59.0 in | Wt 157.6 lb

## 2023-12-26 DIAGNOSIS — Z23 Encounter for immunization: Secondary | ICD-10-CM

## 2023-12-26 DIAGNOSIS — G8929 Other chronic pain: Secondary | ICD-10-CM

## 2023-12-26 DIAGNOSIS — M25571 Pain in right ankle and joints of right foot: Secondary | ICD-10-CM

## 2023-12-26 NOTE — Patient Instructions (Signed)
[facility] at [facility] Address: [address] [place], [place], [place] [address] Phone: [phone]

## 2023-12-26 NOTE — Progress Notes (Signed)
 Assessment & Plan:  Kelsey Davies was seen today for ankle pain.  Diagnoses and all orders for this visit:  Chronic pain of right ankle -     Ambulatory referral to Podiatry Right foot and leg pain with weakness and swelling Chronic pain radiating to the knee with weakness and swelling, unresponsive to meloxicam , affecting work ability. - Refer to foot specialist for evaluation. - Advise wearing supportive shoes until specialist consultation. - Provide work note for two weeks off due to inability to bear weight. - Instruct to contact foot specialist if not heard from within a week.   Need for influenza vaccination -     Flu vaccine trivalent PF, 6mos and older(Flulaval,Afluria,Fluarix,Fluzone)   Patient has been counseled on age-appropriate routine health concerns for screening and prevention. These are reviewed and up-to-date. Referrals have been placed accordingly. Immunizations are up-to-date or declined.    Subjective:   Chief Complaint  Patient presents with   Ankle Pain    Right ankle     Kelsey Davies 33 y.o. female presents to office today for chronic right ankle pain  She also has been experiencing right ankle pain after tripping and falling 09-07-2023.  The ankle has been swollen and painful, and she was unable to walk for about fifteen days after being seen at the urgent care on 09-07-2023. She has been keeping it wrapped and taking ibuprofen  and tylenol  for her back and ankle pain with little relief.   Despite taking meloxicam  since August, the pain has not improved. She has also tried ibuprofen  with no relief.  The severity of the pain has been significant enough to prevent her from working for the past week. She is unable to bear weight on her right ankle, which has greatly affected her daily activities.   09-07-2023 Right ankle Xray Soft tissue swelling and evidence of a small joint effusion. No fracture or dislocation identified about the right ankle.  Review of  Systems  Constitutional:  Negative for fever, malaise/fatigue and weight loss.  HENT: Negative.  Negative for nosebleeds.   Eyes: Negative.  Negative for blurred vision, double vision and photophobia.  Respiratory: Negative.  Negative for cough and shortness of breath.   Cardiovascular: Negative.  Negative for chest pain, palpitations and leg swelling.  Gastrointestinal: Negative.  Negative for heartburn, nausea and vomiting.  Musculoskeletal:  Positive for joint pain. Negative for myalgias.  Neurological: Negative.  Negative for dizziness, focal weakness, seizures and headaches.  Psychiatric/Behavioral: Negative.  Negative for suicidal ideas.     Past Medical History:  Diagnosis Date   Medical history non-contributory     Past Surgical History:  Procedure Laterality Date   CHOLECYSTECTOMY     NO PAST SURGERIES      Family History  Problem Relation Age of Onset   Healthy Mother    Rheum arthritis Sister    Rheum arthritis Brother    Rheum arthritis Maternal Grandmother    Rheum arthritis Maternal Grandfather    Asthma Paternal Grandmother    Hearing loss Neg Hx    Colon cancer Neg Hx    Stomach cancer Neg Hx    Pancreatic cancer Neg Hx    Colon polyps Neg Hx    Esophageal cancer Neg Hx     Social History Reviewed with no changes to be made today.   Outpatient Medications Prior to Visit  Medication Sig Dispense Refill   acetaminophen  (TYLENOL ) 500 MG tablet Take 1 tablet (500 mg total) by mouth every 6 (  six) hours as needed. 60 tablet 0   clotrimazole -betamethasone  (LOTRISONE ) cream Apply 1 Application topically daily. 30 g 1   hyoscyamine  (LEVSIN  SL) 0.125 MG SL tablet Place 1 tablet (0.125 mg total) under the tongue in the morning, at noon, and at bedtime. 90 tablet 1   meloxicam  (MOBIC ) 7.5 MG tablet Take 1-2 tablets (7.5-15 mg total) by mouth daily. 60 tablet 0   Misc. Devices MISC Please supply with size M adult diapers, gloves and wipes. ICD 10 N39.3 1 each 0    Facility-Administered Medications Prior to Visit  Medication Dose Route Frequency Provider Last Rate Last Admin   0.9 %  sodium chloride  infusion  500 mL Intravenous Once Nandigam, Kavitha V, MD        No Known Allergies     Objective:    BP 107/75 (BP Location: Left Arm, Patient Position: Sitting, Cuff Size: Normal)   Pulse 100   Resp 19   Ht 4' 11 (1.499 m)   Wt 157 lb 9.6 oz (71.5 kg)   LMP 12/09/2023   SpO2 99%   BMI 31.83 kg/m  Wt Readings from Last 3 Encounters:  12/26/23 157 lb 9.6 oz (71.5 kg)  12/16/23 156 lb (70.8 kg)  12/01/23 156 lb 12.8 oz (71.1 kg)    Physical Exam Vitals and nursing note reviewed.  Constitutional:      Appearance: She is well-developed.  HENT:     Head: Normocephalic and atraumatic.  Cardiovascular:     Rate and Rhythm: Normal rate and regular rhythm.     Heart sounds: Normal heart sounds. No murmur heard.    No friction rub. No gallop.  Pulmonary:     Effort: Pulmonary effort is normal. No tachypnea or respiratory distress.     Breath sounds: Normal breath sounds. No decreased breath sounds, wheezing, rhonchi or rales.  Chest:     Chest wall: No tenderness.  Abdominal:     General: Bowel sounds are normal.     Palpations: Abdomen is soft.  Musculoskeletal:        General: Normal range of motion.     Cervical back: Normal range of motion.     Right ankle: Swelling present.       Legs:  Skin:    General: Skin is warm and dry.  Neurological:     Mental Status: She is alert and oriented to person, place, and time.     Coordination: Coordination normal.  Psychiatric:        Behavior: Behavior normal. Behavior is cooperative.        Thought Content: Thought content normal.        Judgment: Judgment normal.          Patient has been counseled extensively about nutrition and exercise as well as the importance of adherence with medications and regular follow-up. The patient was given clear instructions to go to ER or return  to medical center if symptoms don't improve, worsen or new problems develop. The patient verbalized understanding.   Follow-up: Return if symptoms worsen or fail to improve.   Haze LELON Servant, FNP-BC [facility] [place], [place] [phone]   12/26/2023, 2:40 PM

## 2023-12-26 NOTE — Progress Notes (Signed)
 Still swollen and pain is getting worse.

## 2024-01-02 ENCOUNTER — Ambulatory Visit: Payer: Self-pay | Admitting: Gastroenterology

## 2024-01-07 ENCOUNTER — Ambulatory Visit: Payer: Self-pay | Admitting: Nurse Practitioner

## 2024-01-08 ENCOUNTER — Other Ambulatory Visit: Payer: Self-pay | Admitting: Gastroenterology

## 2024-01-21 ENCOUNTER — Ambulatory Visit (INDEPENDENT_AMBULATORY_CARE_PROVIDER_SITE_OTHER): Payer: Self-pay | Admitting: Podiatry

## 2024-01-21 DIAGNOSIS — S93491D Sprain of other ligament of right ankle, subsequent encounter: Secondary | ICD-10-CM

## 2024-01-21 NOTE — Progress Notes (Signed)
  Subjective:  Patient ID: Kelsey Davies, female    DOB: Aug 29, 1990,  MRN: [id number]  Chief Complaint  Patient presents with   Foot Pain    Pt stated that she has been having some swelling in her ankle     33 y.o. female presents with the above complaint.  Patient presents with complaint of right ankle sprain.  She states that she sprained it back in May.  She wanted to get it evaluated has not seen anyone else prior to seeing me she went to urgent care but did not give her any kind of immobilization device.  She is not having a lot of pain.  Pain scale 7 out of 10 dull aching nature hurts with plantarflexion of the foot.  Hurts going down heel   Review of Systems: Negative except as noted in the HPI. Denies N/V/F/Ch.  Past Medical History:  Diagnosis Date   Medical history non-contributory     Current Outpatient Medications:    acetaminophen  (TYLENOL ) 500 MG tablet, Take 1 tablet (500 mg total) by mouth every 6 (six) hours as needed., Disp: 60 tablet, Rfl: 0   clotrimazole -betamethasone  (LOTRISONE ) cream, Apply 1 Application topically daily., Disp: 30 g, Rfl: 1   hyoscyamine  (LEVSIN  SL) 0.125 MG SL tablet, PLACE 1 TABLET (0.125 MG TOTAL) UNDER THE TONGUE IN THE MORNING, AT NOON, AND AT BEDTIME., Disp: 270 tablet, Rfl: 1   meloxicam  (MOBIC ) 7.5 MG tablet, Take 1-2 tablets (7.5-15 mg total) by mouth daily., Disp: 60 tablet, Rfl: 0   Misc. Devices MISC, Please supply with size M adult diapers, gloves and wipes. ICD 10 N39.3, Disp: 1 each, Rfl: 0  Current Facility-Administered Medications:    0.9 %  sodium chloride  infusion, 500 mL, Intravenous, Once, Nandigam, Kavitha V, MD  Social History   Tobacco Use  Smoking Status Never  Smokeless Tobacco Never    No Known Allergies Objective:  There were no vitals filed for this visit. There is no height or weight on file to calculate BMI. Constitutional Well developed. Well nourished.  Vascular Dorsalis pedis pulses palpable  bilaterally. Posterior tibial pulses palpable bilaterally. Capillary refill normal to all digits.  No cyanosis or clubbing noted. Pedal hair growth normal.  Neurologic Normal speech. Oriented to person, place, and time. Epicritic sensation to light touch grossly present bilaterally.  Dermatologic Nails well groomed and normal in appearance. No open wounds. No skin lesions.  Orthopedic: Pain on palpation right ATFL ligament pain with plantarflexion inversion of the foot no pain with dorsiflexion eversion of the foot.  No pain at the peroneal tendon posterior tibial tendon Achilles tendon   Radiographs: None Assessment:   1. Sprain of anterior talofibular ligament of right ankle, subsequent encounter    Plan:  Patient was evaluated and treated and all questions answered.  Right ankle sprain moderate - All questions and concerns were discussed with the patient extensive detail given the amount of pain that she is experiencing should benefit from cam boot immobilization I discussed with patient she states understanding like to proceed with cam boot immobilization Cam boot was dispensed - If there is no improvement discussed third injection and then future.  No follow-ups on file.

## 2024-02-18 ENCOUNTER — Ambulatory Visit: Payer: Self-pay | Admitting: Podiatry

## 2024-03-08 ENCOUNTER — Ambulatory Visit: Payer: Self-pay | Admitting: Dermatology

## 2024-03-08 ENCOUNTER — Encounter: Payer: Self-pay | Admitting: Dermatology

## 2024-03-08 VITALS — BP 110/76 | HR 90

## 2024-03-08 DIAGNOSIS — L83 Acanthosis nigricans: Secondary | ICD-10-CM

## 2024-03-08 DIAGNOSIS — L918 Other hypertrophic disorders of the skin: Secondary | ICD-10-CM

## 2024-03-08 DIAGNOSIS — L309 Dermatitis, unspecified: Secondary | ICD-10-CM

## 2024-03-08 DIAGNOSIS — R7303 Prediabetes: Secondary | ICD-10-CM

## 2024-03-08 DIAGNOSIS — L308 Other specified dermatitis: Secondary | ICD-10-CM

## 2024-03-08 MED ORDER — CLOBETASOL PROPIONATE 0.05 % EX OINT: 1.0000 | TOPICAL_OINTMENT | Freq: Two times a day (BID) | CUTANEOUS | 3 refills | Status: AC

## 2024-03-08 NOTE — Patient Instructions (Addendum)
 ????? ??????:  ??, ???????? ?????? ?????, ??????? ?????? ???? ??? ? ?????? ?????? ??????? ?????? ?? ???????? ??????????? ???? ? ??????? ?????? ???? ???????  ??????? ?????:  -???????????????? ????? ???? ???????????? ???????????: ???????????? ??????????? ?????? ?????? ?? ??? ????, ????? ?????? ?????? ??????? ?????, ??? ?????? ???? ???? ???????? ???? ? ???????? ?????-????????? ????????? ?????? ??????? ????? ?????? ???? ?????????? ????, ?????? ??????????? A1c ????????? ???? ????? ???? ???????? ?????? ??????? ???? ???? ??? ????? ? ?????? ???? ?? ???? ??????? ??????? ?????? ???????? ????, ????????????? ???? ???????? ????? ??? ?????????? ???? ???? (? ????????) ?????? ????????? ? ????? ? ?????? ??????????? ??? ??????? ???????? ??? ????? ??????????? ????? ? ????????? ????? ?????  -????? ???? ???? (???????): ????? ????, ?? ???????, ?? ?????? ?? ???? ??????, ??????? ? ??????? ???? ???????????, ?????? ????? ???? ????? ???? ????? ???????????? ????????? ?????? ??? ????????? ????? ??? ??? ????? ??????????? ??? ??????? ????? ???? ?? ?????, ??????????? ?????? ?????? ????? ?????? ????? ?????????? ? ?????? ??? ?????? ?????? ????? ??????? ????? ?????? ??????????  ???????????:  ????? ????? ?????? ?????? ?????????? ???? ????? ??????????? A1c ????????? ???? ????? ?????????? ??????? ?????? ???? ?????? ? ???????? ??????????? ???? ??????????, ? ????? ?????????? ??????? ?????????? ??? ???????? ???? ?????? ? ?? ??? ??????? ?????????? ????? ??? ???, ???-?? ???????????? ?????? ???????????  ???? ????????? (???? ????????): ???????? ??????? ???? ??????:  ? ???? ?? ?????? ????? $??? ?? ???? ?? ????? $??? ????? $??? ??-?? ???????? ?????????? ???? ????? ????? $?? VISIT SUMMARY:  Today, you were seen for skin tags, dark patches on your skin, and dry hands. We discussed the causes and treatments for each of these issues.  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 To assess your blood sugar levels, we have ordered a hemoglobin A1c test. We recommend weight loss and reducing sugar intake to prevent the progression to diabetes. For skin care, use glycolic acid toner (The Ordinary) every other night for exfoliation and apply Eucerin Radiant Tone cream with thiaminol morning and night. Improvement may take up to six months.  -HAND DERMATITIS (ECZEMA): Hand dermatitis, or eczema, is a condition that causes dry, itchy, and irritated skin, often worsened by activities like dishwashing without gloves. We have prescribed clobetasol ointment to be applied twice daily for two weeks. Additionally, wear gloves while washing dishes to prevent recurrence and use hand creams regularly after washing your hands throughout the day.  INSTRUCTIONS:  Please follow up for the hemoglobin A1c test to assess your blood sugar levels. Continue with the recommended skin care and lifestyle changes, and monitor your symptoms. If you have any concerns or if your symptoms do not improve, schedule a follow-up appointment.  Other Procedure(Skin Tags): Quote for cosmetic removal:  $200 to remove 1 to 15 lesions $300 to remove  16 to 25 $400 to removal  26-35 $10 per tag for each additional        Important Information   Due to recent changes in healthcare laws, you may see results of your pathology and/or laboratory studies on MyChart before the doctors have had a chance to review them. We understand that in some cases there may be results that are confusing or concerning to you. Please understand that not all results are received at the same time and often the doctors may need to interpret multiple results in order to provide you with the best plan of care or  course of treatment. Therefore, we ask that you please give us  2 business days to thoroughly review all your results  before contacting the office for clarification. Should we see a critical lab result, you will be contacted sooner.     If You Need Anything After Your Visit   If you have any questions or concerns for your doctor, please call our main line at [phone]. If no one answers, please leave a voicemail as directed and we will return your call as soon as possible. Messages left after 4 pm will be answered the following business day.    You may also send us  a message via MyChart. We typically respond to MyChart messages within 1-2 business days.  For prescription refills, please ask your pharmacy to contact our office. Our fax number is [phone].  If you have an urgent issue when the clinic is closed that cannot wait until the next business day, you can page your doctor at the number below.     Please note that while we do our best to be available for urgent issues outside of office hours, we are not available 24/7.    If you have an urgent issue and are unable to reach us , you may choose to seek medical care at your doctor's office, retail clinic, urgent care center, or emergency room.   If you have a medical emergency, please immediately call 911 or go to the emergency department. In the event of inclement weather, please call our main line at [phone] for an update on the status of any delays or closures.  Dermatology Medication Tips: Please keep the boxes that topical medications come in in order to help keep track of the instructions about where and how to use these. Pharmacies typically print the medication instructions only on the boxes and not directly on the medication tubes.   If your medication is too expensive, please contact our office at [phone] or send us  a message through MyChart.    We are unable to tell what your co-pay for medications will be in advance as this is different depending on your insurance coverage. However, we may be able to find a substitute  medication at lower cost or fill out paperwork to get insurance to cover a needed medication.    If a prior authorization is required to get your medication covered by your insurance company, please allow us  1-2 business days to complete this process.   Drug prices often vary depending on where the prescription is filled and some pharmacies may offer cheaper prices.   The website www.goodrx.com contains coupons for medications through different pharmacies. The prices here do not account for what the cost may be with help from insurance (it may be cheaper with your insurance), but the website can give you the price if you did not use any insurance.  - You can print the associated coupon and take it with your prescription to the pharmacy.  - You may also stop by our office during regular business hours and pick up a GoodRx coupon card.  - If you need your prescription sent electronically to a different pharmacy, notify our office through [facility] or by phone at [phone]

## 2024-03-08 NOTE — Progress Notes (Signed)
 New Patient Visit   Subjective  Kelsey Davies is a 33 y.o. female accompanied by sister Clive) who presents for the following: Skin Tags  Patient states she has skin tags located at the neck and B/L Axilla that she would like to have examined. Patient reports the areas have been there for several years. She reports the areas are bothersome. Patient reports the areas can be painful. Patient rates irritation 7 out of 10.Patient reports she has not previously been treated for these areas. Patient denies Hx of bx. Patient denies family history of skin cancer(s).  Patient reports she is not pregnant, trying to conceive or nursing.  Patient provided verbal consent for the use of an AI-assisted program to generate a detailed after-visit summary. The patient understands that the AI tool is used to support clinical documentation and that all information will be reviewed and verified by the healthcare provider.  The following portions of the chart were reviewed this encounter and updated as appropriate: medications, allergies, medical history  Review of Systems:  No other skin or systemic complaints except as noted in HPI or Assessment and Plan.  Objective  Well appearing patient in no apparent distress; mood and affect are within normal limits.  A focused examination was performed of the following areas: Neck and B/L Axilla  Relevant exam findings are noted in the Assessment and Plan.    Assessment & Plan    Acanthosis nigricans with risk of prediabetes Acanthosis nigricans present on the arm and neck, associated with weight gain and potential prediabetes. No family history of diabetes. No recent weight gain reported. Condition indicates elevated blood sugar levels and potential progression to diabetes if lifestyle changes are not implemented. - Ordered hemoglobin A1c test to assess blood sugar levels. - Advised weight loss and reduction of sugar intake to prevent progression to  diabetes. - Recommended glycolic acid toner (The Ordinary) for exfoliation, to be used every other night. - Provided sample of Eucerin Radiant Tone cream with thiaminol for pigment lightening, to be used morning and night. - Informed that improvement may take up to six months.  Hand dermatitis (eczema) Hand eczema likely due to frequent dishwashing without gloves, leading to dryness and irritation. Symptoms include dryness and itchiness, exacerbated by winter conditions. - Prescribed clobetasol ointment to be applied twice daily for two weeks. - Advised wearing gloves while washing dishes to prevent recurrence. - Provided samples of hand creams for regular use after washing hands. - Instructed to apply hand lotion after washing hands throughout the day.    Acrochordons (Skin Tags) - Fleshy, skin-colored pedunculated papules - Benign appearing.  - Observe. - If desired, they can be removed with an in office procedure that is not covered by insurance. - Please call the clinic if you notice any new or changing lesions.   Other Procedure(Skin Tags): Quote for cosmetic removal:  $200 to remove on 1-15 $300 to remove on 16-30    PRE-DIABETES   Related Procedures Hemoglobin A1c HAND DERMATITIS   Related Medications clobetasol ointment (TEMOVATE) 0.05 % Apply 1 Application topically 2 (two) times daily. Apply to hands 2 times daily for 2 weeks then STOP for 2 Weeks, Restart after 2 weeks if hands are not clear  Return in about 6 months (around 09/05/2024), or if symptoms worsen or fail to improve, for Acanthosis Nigracans 7 Hand Dermatitist F/U.  I, Jetta Ager, am acting as neurosurgeon for [employer], DO.  Documentation: I have reviewed the above documentation for accuracy  and completeness, and I agree with the above.  Delon Lenis, DO

## 2024-04-30 ENCOUNTER — Encounter: Payer: Self-pay | Admitting: Nurse Practitioner

## 2024-04-30 ENCOUNTER — Ambulatory Visit: Payer: Self-pay | Attending: Nurse Practitioner | Admitting: Nurse Practitioner

## 2024-04-30 VITALS — BP 97/68 | HR 88 | Ht 59.0 in | Wt 157.2 lb

## 2024-04-30 DIAGNOSIS — E559 Vitamin D deficiency, unspecified: Secondary | ICD-10-CM

## 2024-04-30 DIAGNOSIS — L853 Xerosis cutis: Secondary | ICD-10-CM

## 2024-04-30 NOTE — Progress Notes (Signed)
 "  Assessment & Plan:  Kelsey Davies was seen today for rash.  Diagnoses and all orders for this visit:  Dry skin -     CMP14+EGFR -     Hemoglobin A1c  Vitamin D  deficiency disease -     VITAMIN D  25 Hydroxy (Vit-D Deficiency, Fractures)    Patient has been counseled on age-appropriate routine health concerns for screening and prevention. These are reviewed and up-to-date. Referrals have been placed accordingly. Immunizations are up-to-date or declined.    Subjective:   Chief Complaint  Patient presents with   Rash    Dry skin between fingers and sides of belly. The cream works for the skin between finger but not on sids on belly.    Kelsey Davies 34 y.o. female presents to office today with concerns of rash.   VRI was used to communicate directly with patient for the entire encounter including providing detailed patient instructions.    She experiences intermittent episodes of dry and itchy skin on her stomach and left side, describing it as 'too itchy, dry'. These episodes last for a few days, resolve, and then recur.  She has been using a cream prescribed by her dermatologist, but she applies it for a couple of days and then stops, rather than the recommended twice daily application for two weeks   Review of Systems  Constitutional: Negative.   Respiratory: Negative.    Cardiovascular: Negative.   Skin:  Positive for itching and rash.  Neurological: Negative.     Past Medical History:  Diagnosis Date   Medical history non-contributory     Past Surgical History:  Procedure Laterality Date   CHOLECYSTECTOMY     NO PAST SURGERIES      Family History  Problem Relation Age of Onset   Healthy Mother    Rheum arthritis Sister    Rheum arthritis Brother    Rheum arthritis Maternal Grandmother    Rheum arthritis Maternal Grandfather    Asthma Paternal Grandmother    Hearing loss Neg Hx    Colon cancer Neg Hx    Stomach cancer Neg Hx    Pancreatic cancer Neg Hx     Colon polyps Neg Hx    Esophageal cancer Neg Hx     Social History Reviewed with no changes to be made today.   Outpatient Medications Prior to Visit  Medication Sig Dispense Refill   acetaminophen  (TYLENOL ) 500 MG tablet Take 1 tablet (500 mg total) by mouth every 6 (six) hours as needed. 60 tablet 0   clotrimazole -betamethasone  (LOTRISONE ) cream Apply 1 Application topically daily. 30 g 1   hyoscyamine  (LEVSIN  SL) 0.125 MG SL tablet PLACE 1 TABLET (0.125 MG TOTAL) UNDER THE TONGUE IN THE MORNING, AT NOON, AND AT BEDTIME. 270 tablet 1   Misc. Devices MISC Please supply with size M adult diapers, gloves and wipes. ICD 10 N39.3 1 each 0   clobetasol  ointment (TEMOVATE ) 0.05 % Apply 1 Application topically 2 (two) times daily. Apply to hands 2 times daily for 2 weeks then STOP for 2 Weeks, Restart after 2 weeks if hands are not clear (Patient not taking: Reported on 04/30/2024) 60 g 3   meloxicam  (MOBIC ) 7.5 MG tablet Take 1-2 tablets (7.5-15 mg total) by mouth daily. (Patient not taking: Reported on 04/30/2024) 60 tablet 0   Facility-Administered Medications Prior to Visit  Medication Dose Route Frequency Provider Last Rate Last Admin   0.9 %  sodium chloride  infusion  500 mL Intravenous Once Nandigam,  Gustav GAILS, MD        Allergies[1]     Objective:    BP 97/68 (BP Location: Left Arm, Patient Position: Sitting, Cuff Size: Normal)   Pulse 88   Ht 4' 11 (1.499 m)   Wt 157 lb 3.2 oz (71.3 kg)   LMP 04/17/2024 (Exact Date)   SpO2 100%   BMI 31.75 kg/m  Wt Readings from Last 3 Encounters:  04/30/24 157 lb 3.2 oz (71.3 kg)  12/26/23 157 lb 9.6 oz (71.5 kg)  12/16/23 156 lb (70.8 kg)    Physical Exam Vitals and nursing note reviewed.  Constitutional:      Appearance: She is well-developed.  HENT:     Head: Normocephalic and atraumatic.  Cardiovascular:     Rate and Rhythm: Normal rate and regular rhythm.     Heart sounds: Normal heart sounds. No murmur heard.    No friction rub.  No gallop.  Pulmonary:     Effort: Pulmonary effort is normal. No tachypnea or respiratory distress.     Breath sounds: Normal breath sounds. No decreased breath sounds, wheezing, rhonchi or rales.  Chest:     Chest wall: No tenderness.  Skin:    General: Skin is warm and dry.     Coloration: Skin is ashen.     Findings: No rash. Rash is not macular, papular or vesicular.  Neurological:     Mental Status: She is alert and oriented to person, place, and time.     Coordination: Coordination normal.  Psychiatric:        Behavior: Behavior normal. Behavior is cooperative.        Thought Content: Thought content normal.        Judgment: Judgment normal.          Patient has been counseled extensively about nutrition and exercise as well as the importance of adherence with medications and regular follow-up. The patient was given clear instructions to go to ER or return to medical center if symptoms don't improve, worsen or new problems develop. The patient verbalized understanding.   Follow-up: Return if symptoms worsen or fail to improve.   Haze LELON Servant, FNP-BC [facility] [place], [place] [phone]   05/16/2024, 10:12 PM     [1] No Known Allergies  "

## 2024-05-01 LAB — CMP14+EGFR
ALT: 51 IU/L — ABNORMAL HIGH (ref 0–32)
AST: 31 IU/L (ref 0–40)
Albumin: 4.2 g/dL (ref 3.9–4.9)
Alkaline Phosphatase: 104 IU/L (ref 41–116)
BUN/Creatinine Ratio: 21 (ref 9–23)
BUN: 11 mg/dL (ref 6–20)
Bilirubin Total: <0.2 mg/dL (ref 0.0–1.2)
CO2: 23 mmol/L (ref 20–29)
Calcium: 9.2 mg/dL (ref 8.7–10.2)
Chloride: 102 mmol/L (ref 96–106)
Creatinine, Ser: 0.53 mg/dL — ABNORMAL LOW (ref 0.57–1.00)
Globulin, Total: 2.7 g/dL (ref 1.5–4.5)
Glucose: 90 mg/dL (ref 70–99)
Potassium: 4.5 mmol/L (ref 3.5–5.2)
Sodium: 140 mmol/L (ref 134–144)
Total Protein: 6.9 g/dL (ref 6.0–8.5)
eGFR: 127 mL/min/1.73

## 2024-05-01 LAB — VITAMIN D 25 HYDROXY (VIT D DEFICIENCY, FRACTURES): Vit D, 25-Hydroxy: 7.7 ng/mL — ABNORMAL LOW (ref 30.0–100.0)

## 2024-05-01 LAB — HEMOGLOBIN A1C
Est. average glucose Bld gHb Est-mCnc: 117 mg/dL
Hgb A1c MFr Bld: 5.7 % — ABNORMAL HIGH (ref 4.8–5.6)

## 2024-05-04 ENCOUNTER — Ambulatory Visit: Payer: Self-pay | Admitting: Nurse Practitioner

## 2024-05-04 DIAGNOSIS — E559 Vitamin D deficiency, unspecified: Secondary | ICD-10-CM

## 2024-05-04 MED ORDER — VITAMIN D (ERGOCALCIFEROL) 1.25 MG (50000 UNIT) PO CAPS: 50000.0000 [IU] | ORAL_CAPSULE | ORAL | 1 refills | Status: AC

## 2024-05-16 ENCOUNTER — Encounter: Payer: Self-pay | Admitting: Nurse Practitioner

## 2024-09-15 ENCOUNTER — Ambulatory Visit: Payer: Self-pay | Admitting: Dermatology
